# Patient Record
Sex: Female | Born: 1975 | Race: White | Hispanic: No | Marital: Married | Smoking: Current every day smoker
Health system: Southern US, Community
[De-identification: ages and names within clinical notes are randomized; demographics above are authoritative.]

## PROBLEM LIST (undated history)

## (undated) DIAGNOSIS — M792 Neuralgia and neuritis, unspecified: Secondary | ICD-10-CM

## (undated) DIAGNOSIS — Z8541 Personal history of malignant neoplasm of cervix uteri: Secondary | ICD-10-CM

## (undated) DIAGNOSIS — N3040 Irradiation cystitis without hematuria: Secondary | ICD-10-CM

## (undated) DIAGNOSIS — Z87820 Personal history of traumatic brain injury: Secondary | ICD-10-CM

## (undated) DIAGNOSIS — F32A Depression, unspecified: Secondary | ICD-10-CM

## (undated) DIAGNOSIS — Z87442 Personal history of urinary calculi: Secondary | ICD-10-CM

## (undated) DIAGNOSIS — K5909 Other constipation: Secondary | ICD-10-CM

## (undated) DIAGNOSIS — R350 Frequency of micturition: Secondary | ICD-10-CM

## (undated) DIAGNOSIS — Z973 Presence of spectacles and contact lenses: Secondary | ICD-10-CM

## (undated) DIAGNOSIS — R351 Nocturia: Secondary | ICD-10-CM

## (undated) DIAGNOSIS — K56609 Unspecified intestinal obstruction, unspecified as to partial versus complete obstruction: Secondary | ICD-10-CM

## (undated) DIAGNOSIS — E079 Disorder of thyroid, unspecified: Secondary | ICD-10-CM

## (undated) DIAGNOSIS — IMO0002 Reserved for concepts with insufficient information to code with codable children: Secondary | ICD-10-CM

## (undated) DIAGNOSIS — F329 Major depressive disorder, single episode, unspecified: Secondary | ICD-10-CM

## (undated) DIAGNOSIS — K219 Gastro-esophageal reflux disease without esophagitis: Secondary | ICD-10-CM

## (undated) DIAGNOSIS — C801 Malignant (primary) neoplasm, unspecified: Secondary | ICD-10-CM

## (undated) HISTORY — PX: OTHER SURGICAL HISTORY: SHX169

## (undated) HISTORY — PX: EXTRACORPOREAL SHOCK WAVE LITHOTRIPSY: SHX1557

## (undated) HISTORY — PX: TONSILLECTOMY: SUR1361

## (undated) HISTORY — PX: CERVICAL CONE BIOPSY: SUR198

---

## 2008-04-13 DIAGNOSIS — Z923 Personal history of irradiation: Secondary | ICD-10-CM | POA: Insufficient documentation

## 2012-11-15 DIAGNOSIS — C539 Malignant neoplasm of cervix uteri, unspecified: Secondary | ICD-10-CM | POA: Insufficient documentation

## 2013-01-31 ENCOUNTER — Encounter (HOSPITAL_BASED_OUTPATIENT_CLINIC_OR_DEPARTMENT_OTHER): Payer: Self-pay | Admitting: *Deleted

## 2013-01-31 ENCOUNTER — Other Ambulatory Visit: Payer: Self-pay | Admitting: Urology

## 2013-01-31 NOTE — Progress Notes (Signed)
NPO AFTER MN. ARRIVE AT 0900. NEEDS HG. IF NEEDED MAY TAKE NORCO W/ SIPS OF WATER.

## 2013-02-03 ENCOUNTER — Ambulatory Visit (HOSPITAL_BASED_OUTPATIENT_CLINIC_OR_DEPARTMENT_OTHER)
Admission: RE | Admit: 2013-02-03 | Discharge: 2013-02-03 | Disposition: A | Discharge status: Home / Self Care | Payer: BC Managed Care – PPO | Source: Ambulatory Visit | Attending: Urology | Admitting: Urology

## 2013-02-03 ENCOUNTER — Ambulatory Visit (HOSPITAL_BASED_OUTPATIENT_CLINIC_OR_DEPARTMENT_OTHER): Payer: BC Managed Care – PPO | Admitting: Anesthesiology

## 2013-02-03 ENCOUNTER — Encounter (HOSPITAL_BASED_OUTPATIENT_CLINIC_OR_DEPARTMENT_OTHER)
Admission: RE | Disposition: A | Discharge status: Home / Self Care | Payer: Self-pay | Source: Ambulatory Visit | Attending: Urology

## 2013-02-03 ENCOUNTER — Encounter (HOSPITAL_BASED_OUTPATIENT_CLINIC_OR_DEPARTMENT_OTHER): Payer: Self-pay | Admitting: *Deleted

## 2013-02-03 ENCOUNTER — Encounter (HOSPITAL_BASED_OUTPATIENT_CLINIC_OR_DEPARTMENT_OTHER): Payer: BC Managed Care – PPO | Admitting: Anesthesiology

## 2013-02-03 DIAGNOSIS — T66XXXS Radiation sickness, unspecified, sequela: Secondary | ICD-10-CM | POA: Insufficient documentation

## 2013-02-03 DIAGNOSIS — N3090 Cystitis, unspecified without hematuria: Secondary | ICD-10-CM | POA: Insufficient documentation

## 2013-02-03 DIAGNOSIS — M79609 Pain in unspecified limb: Secondary | ICD-10-CM | POA: Insufficient documentation

## 2013-02-03 DIAGNOSIS — N3040 Irradiation cystitis without hematuria: Secondary | ICD-10-CM | POA: Insufficient documentation

## 2013-02-03 DIAGNOSIS — R310 Gross hematuria: Secondary | ICD-10-CM | POA: Insufficient documentation

## 2013-02-03 DIAGNOSIS — Y842 Radiological procedure and radiotherapy as the cause of abnormal reaction of the patient, or of later complication, without mention of misadventure at the time of the procedure: Secondary | ICD-10-CM | POA: Insufficient documentation

## 2013-02-03 DIAGNOSIS — K219 Gastro-esophageal reflux disease without esophagitis: Secondary | ICD-10-CM | POA: Insufficient documentation

## 2013-02-03 DIAGNOSIS — Z79899 Other long term (current) drug therapy: Secondary | ICD-10-CM | POA: Insufficient documentation

## 2013-02-03 DIAGNOSIS — R351 Nocturia: Secondary | ICD-10-CM | POA: Insufficient documentation

## 2013-02-03 DIAGNOSIS — Z87891 Personal history of nicotine dependence: Secondary | ICD-10-CM | POA: Insufficient documentation

## 2013-02-03 DIAGNOSIS — C539 Malignant neoplasm of cervix uteri, unspecified: Secondary | ICD-10-CM | POA: Insufficient documentation

## 2013-02-03 HISTORY — DX: Personal history of urinary calculi: Z87.442

## 2013-02-03 HISTORY — DX: Presence of spectacles and contact lenses: Z97.3

## 2013-02-03 HISTORY — DX: Reserved for concepts with insufficient information to code with codable children: IMO0002

## 2013-02-03 HISTORY — DX: Personal history of malignant neoplasm of cervix uteri: Z85.41

## 2013-02-03 HISTORY — DX: Other constipation: K59.09

## 2013-02-03 HISTORY — DX: Gastro-esophageal reflux disease without esophagitis: K21.9

## 2013-02-03 HISTORY — DX: Nocturia: R35.1

## 2013-02-03 HISTORY — DX: Frequency of micturition: R35.0

## 2013-02-03 HISTORY — DX: Personal history of traumatic brain injury: Z87.820

## 2013-02-03 HISTORY — PX: CYSTOSCOPY WITH BIOPSY: SHX5122

## 2013-02-03 LAB — POCT HEMOGLOBIN-HEMACUE: HEMOGLOBIN: 12.7 g/dL (ref 12.0–15.0)

## 2013-02-03 SURGERY — CYSTOSCOPY, WITH BIOPSY
Anesthesia: General | Site: Bladder

## 2013-02-03 MED ORDER — LIDOCAINE HCL (CARDIAC) 20 MG/ML IV SOLN
INTRAVENOUS | Status: DC | PRN
  Administered 2013-02-03: 50 mg via INTRAVENOUS

## 2013-02-03 MED ORDER — LACTATED RINGERS IV SOLN
INTRAVENOUS | Status: DC
  Administered 2013-02-03 (×2): via INTRAVENOUS
  Filled 2013-02-03: qty 1000

## 2013-02-03 MED ORDER — DEXAMETHASONE SODIUM PHOSPHATE 4 MG/ML IJ SOLN
INTRAMUSCULAR | Status: DC | PRN
  Administered 2013-02-03: 10 mg via INTRAVENOUS

## 2013-02-03 MED ORDER — TRIMETHOPRIM 100 MG PO TABS: 100.0000 mg | ORAL_TABLET | ORAL | Status: DC

## 2013-02-03 MED ORDER — MIDAZOLAM HCL 5 MG/5ML IJ SOLN
INTRAMUSCULAR | Status: DC | PRN
Start: 2013-02-03 — End: 2013-02-03
  Administered 2013-02-03: 2 mg via INTRAVENOUS

## 2013-02-03 MED ORDER — ACETAMINOPHEN 10 MG/ML IV SOLN
INTRAVENOUS | Status: DC | PRN
  Administered 2013-02-03: 1000 mg via INTRAVENOUS

## 2013-02-03 MED ORDER — BELLADONNA ALKALOIDS-OPIUM 16.2-60 MG RE SUPP
RECTAL | Status: DC | PRN
  Administered 2013-02-03: 1 via RECTAL

## 2013-02-03 MED ORDER — CIPROFLOXACIN IN D5W 400 MG/200ML IV SOLN
400.0000 mg | INTRAVENOUS | Status: AC
Start: 2013-02-03 — End: 2013-02-03
  Administered 2013-02-03: 400 mg via INTRAVENOUS
  Filled 2013-02-03 (×2): qty 200

## 2013-02-03 MED ORDER — PHENAZOPYRIDINE HCL 200 MG PO TABS
ORAL | Status: DC | PRN
  Administered 2013-02-03: 11:00:00 via INTRAVESICAL

## 2013-02-03 MED ORDER — PROPOFOL 10 MG/ML IV BOLUS
INTRAVENOUS | Status: DC | PRN
  Administered 2013-02-03: 150 mg via INTRAVENOUS
  Administered 2013-02-03: 30 mg via INTRAVENOUS

## 2013-02-03 MED ORDER — FENTANYL CITRATE 0.05 MG/ML IJ SOLN
INTRAMUSCULAR | Status: DC | PRN
Start: 2013-02-03 — End: 2013-02-03
  Administered 2013-02-03: 75 ug via INTRAVENOUS
  Administered 2013-02-03: 25 ug via INTRAVENOUS

## 2013-02-03 MED ORDER — BELLADONNA ALKALOIDS-OPIUM 16.2-60 MG RE SUPP
RECTAL | Status: AC
  Filled 2013-02-03: qty 1

## 2013-02-03 MED ORDER — STERILE WATER FOR IRRIGATION IR SOLN
Status: DC | PRN
  Administered 2013-02-03: 3000 mL

## 2013-02-03 MED ORDER — UROGESIC-BLUE 81.6 MG PO TABS: ORAL_TABLET | ORAL | Status: DC

## 2013-02-03 MED ORDER — OXYCODONE-ACETAMINOPHEN 5-325 MG PO TABS: 1.0000 | ORAL_TABLET | ORAL | Status: DC | PRN

## 2013-02-03 MED ORDER — MIDAZOLAM HCL 2 MG/2ML IJ SOLN
INTRAMUSCULAR | Status: AC
  Filled 2013-02-03: qty 2

## 2013-02-03 MED ORDER — FENTANYL CITRATE 0.05 MG/ML IJ SOLN
25.0000 ug | INTRAMUSCULAR | Status: DC | PRN
  Filled 2013-02-03: qty 1

## 2013-02-03 MED ORDER — LACTATED RINGERS IV SOLN
INTRAVENOUS | Status: DC
  Filled 2013-02-03: qty 1000

## 2013-02-03 MED ORDER — OXYCODONE-ACETAMINOPHEN 5-325 MG PO TABS
1.0000 | ORAL_TABLET | ORAL | Status: DC | PRN
Start: 2013-02-03 — End: 2013-02-03
  Administered 2013-02-03: 1 via ORAL
  Filled 2013-02-03 (×2): qty 1

## 2013-02-03 MED ORDER — OXYCODONE-ACETAMINOPHEN 5-325 MG PO TABS
ORAL_TABLET | ORAL | Status: AC
  Filled 2013-02-03: qty 1

## 2013-02-03 MED ORDER — FENTANYL CITRATE 0.05 MG/ML IJ SOLN
INTRAMUSCULAR | Status: AC
  Filled 2013-02-03: qty 2

## 2013-02-03 MED ORDER — KETOROLAC TROMETHAMINE 30 MG/ML IJ SOLN
INTRAMUSCULAR | Status: DC | PRN
  Administered 2013-02-03: 30 mg via INTRAVENOUS

## 2013-02-03 MED ORDER — ONDANSETRON HCL 4 MG/2ML IJ SOLN
INTRAMUSCULAR | Status: DC | PRN
  Administered 2013-02-03: 4 mg via INTRAVENOUS

## 2013-02-03 SURGICAL SUPPLY — 30 items
BAG DRAIN URO-CYSTO SKYTR STRL (DRAIN) ×3 IMPLANT
BOOTIES KNEE HIGH SLOAN (MISCELLANEOUS) IMPLANT
CANISTER SUCT LVC 12 LTR MEDI- (MISCELLANEOUS) ×3 IMPLANT
CATH ROBINSON RED A/P 14FR (CATHETERS) ×3 IMPLANT
CLOTH BEACON ORANGE TIMEOUT ST (SAFETY) ×3 IMPLANT
DRAPE CAMERA CLOSED 9X96 (DRAPES) ×3 IMPLANT
ELECT REM PT RETURN 9FT ADLT (ELECTROSURGICAL) ×3
ELECTRODE REM PT RTRN 9FT ADLT (ELECTROSURGICAL) ×1 IMPLANT
GLOVE BIO SURGEON STRL SZ7.5 (GLOVE) ×3 IMPLANT
GLOVE BIOGEL M SZ8.5 STRL (GLOVE) ×3 IMPLANT
GLOVE BIOGEL PI IND STRL 7.5 (GLOVE) ×1 IMPLANT
GLOVE BIOGEL PI IND STRL 8 (GLOVE) ×1 IMPLANT
GLOVE BIOGEL PI IND STRL 8.5 (GLOVE) ×1 IMPLANT
GLOVE BIOGEL PI INDICATOR 7.5 (GLOVE) ×2
GLOVE BIOGEL PI INDICATOR 8 (GLOVE) ×2
GLOVE BIOGEL PI INDICATOR 8.5 (GLOVE) ×2
GOWN PREVENTION PLUS LG XLONG (DISPOSABLE) ×3 IMPLANT
GOWN STRL REIN XL XLG (GOWN DISPOSABLE) ×6 IMPLANT
GOWN STRL REUS W/ TWL LRG LVL3 (GOWN DISPOSABLE) ×1 IMPLANT
GOWN STRL REUS W/ TWL XL LVL3 (GOWN DISPOSABLE) ×2 IMPLANT
GOWN STRL REUS W/TWL LRG LVL3 (GOWN DISPOSABLE) ×2
GOWN STRL REUS W/TWL XL LVL3 (GOWN DISPOSABLE) ×4
NDL SAFETY ECLIPSE 18X1.5 (NEEDLE) ×1 IMPLANT
NEEDLE HYPO 18GX1.5 SHARP (NEEDLE) ×2
NEEDLE HYPO 22GX1.5 SAFETY (NEEDLE) ×3 IMPLANT
NEEDLE SPNL 22GX7 QUINCKE BK (NEEDLE) IMPLANT
NS IRRIG 500ML POUR BTL (IV SOLUTION) IMPLANT
PACK CYSTOSCOPY (CUSTOM PROCEDURE TRAY) ×3 IMPLANT
SYR 20CC LL (SYRINGE) ×3 IMPLANT
WATER STERILE IRR 3000ML UROMA (IV SOLUTION) ×3 IMPLANT

## 2013-02-03 NOTE — Anesthesia Procedure Notes (Signed)
Procedure Name: LMA Insertion Date/Time: 02/03/2013 10:53 AM Performed by: Bethena Roys T Pre-anesthesia Checklist: Patient identified, Emergency Drugs available, Suction available and Patient being monitored Patient Re-evaluated:Patient Re-evaluated prior to inductionOxygen Delivery Method: Circle System Utilized Preoxygenation: Pre-oxygenation with 100% oxygen Intubation Type: IV induction Ventilation: Mask ventilation without difficulty LMA: LMA inserted LMA Size: 3.0 Number of attempts: 1 Airway Equipment and Method: bite block Placement Confirmation: positive ETCO2 Dental Injury: Teeth and Oropharynx as per pre-operative assessment

## 2013-02-03 NOTE — Anesthesia Postprocedure Evaluation (Signed)
  Anesthesia Post-op Note  Patient: Sue Benson  Procedure(s) Performed: Procedure(s) (LRB): CYSTOSCOPY WITH BIOPSY, CAUTERIZATION OF THE BIOPSY SITES AND RECTAL EXAM UNDER ANESTHESIA (N/A)  Patient Location: PACU  Anesthesia Type: General  Level of Consciousness: awake and alert   Airway and Oxygen Therapy: Patient Spontanous Breathing  Post-op Pain: mild  Post-op Assessment: Post-op Vital signs reviewed, Patient's Cardiovascular Status Stable, Respiratory Function Stable, Patent Airway and No signs of Nausea or vomiting  Last Vitals:  Filed Vitals:   02/03/13 0906  BP: 88/65  Pulse: 71  Temp: 36.2 C  Resp: 16    Post-op Vital Signs: stable   Complications: No apparent anesthesia complications

## 2013-02-03 NOTE — Anesthesia Preprocedure Evaluation (Addendum)
Anesthesia Evaluation  Patient identified by MRN, date of birth, ID band Patient awake    Reviewed: Allergy & Precautions, H&P , NPO status , Patient's Chart, lab work & pertinent test results  Airway Mallampati: II TM Distance: >3 FB Neck ROM: full    Dental no notable dental hx. (+) Teeth Intact and Dental Advisory Given   Pulmonary neg pulmonary ROS, former smoker,  breath sounds clear to auscultation  Pulmonary exam normal       Cardiovascular Exercise Tolerance: Good negative cardio ROS  Rhythm:regular Rate:Normal     Neuro/Psych negative neurological ROS  negative psych ROS   GI/Hepatic negative GI ROS, Neg liver ROS, GERD-  Medicated and Controlled,  Endo/Other  negative endocrine ROS  Renal/GU negative Renal ROS  negative genitourinary   Musculoskeletal   Abdominal   Peds  Hematology negative hematology ROS (+)   Anesthesia Other Findings   Reproductive/Obstetrics negative OB ROS Cervical cancer                          Anesthesia Physical Anesthesia Plan  ASA: II  Anesthesia Plan: General   Post-op Pain Management:    Induction: Intravenous  Airway Management Planned: LMA  Additional Equipment:   Intra-op Plan:   Post-operative Plan:   Informed Consent: I have reviewed the patients History and Physical, chart, labs and discussed the procedure including the risks, benefits and alternatives for the proposed anesthesia with the patient or authorized representative who has indicated his/her understanding and acceptance.   Dental Advisory Given  Plan Discussed with: CRNA and Surgeon  Anesthesia Plan Comments:         Anesthesia Quick Evaluation

## 2013-02-03 NOTE — Transfer of Care (Signed)
Immediate Anesthesia Transfer of Care Note  Patient: Sue Benson  Procedure(s) Performed: Procedure(s): CYSTOSCOPY WITH BIOPSY, CAUTERIZATION OF THE BIOPSY SITES AND RECTAL EXAM UNDER ANESTHESIA (N/A)  Patient Location: PACU  Anesthesia Type:General  Level of Consciousness: awake, alert  and oriented  Airway & Oxygen Therapy: Patient Spontanous Breathing and Patient connected to nasal cannula oxygen  Post-op Assessment: Report given to PACU RN  Post vital signs: Reviewed and stable  Complications: No apparent anesthesia complications

## 2013-02-03 NOTE — Op Note (Signed)
Pre-operative diagnosis :  Cancer of the cervix, prechemotherapy and radiation therapy, gross hematuria.  Postoperative diagnosis:  Delayed onset recent cystitis  Operation:  Cystourethroscopy, cold cup bladder biopsy, cauterization of biopsy sites, rectal examination  Surgeon:  S. Gaynelle Arabian, MD  First assistant:  None  Anesthesia:  General LMA  Preparation:   After appropriate free anesthesia, the patient was brought to room, placed preoperative bulk a dorsal supine position where general elevated anesthesia was induced. She was then replaced in the dorsal lithotomy position where the pubis was prepped with Betadine solution and draped in the usual fashion. The orbit we'll double check. History was double checked.  Review history: n Radiation cystitis  Active Problems  Problems  1. Cervical cancer (180.9)  2. Increased urinary frequency (788.41)  3. 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 Her tumor was thought to be too progressed to be removed, so she underwent brachytherapy and chemotherapy in [place] 5 years ago. She has done well, until she developed gross painless hematuria in October, 2014, associated with nocturia and urinary frequency. 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  She c/o anterior thigh pain, lateral leg pain, without pain radiating below the knees. She has some frequency and urgency and nocturia, but this is not significant enough to warrant medication. She is on chronic pain management with codeine.      Statement of  Likelihood of Success: Excellent. TIME-OUT observed.:  Procedure:  The cystoscope  was passed into the bladder without trauma, and  There was no immediate evidence of bladder mass. The trigone was normal, with clear eflux from both orifices. Multiple areas of telangiectasias were identified and biopsied on the surface of the mucosa of the bladder walls , and the biopsy sites cauterized. The biopsies were sent to laboratory for examination. This appeared to be typical radiation change.  Pyridium was inserted into the bladder to afford post operative analgesia. Rectal exam showed normal rectal vault. No evidence of fistula formation was noted. No masses were identified.

## 2013-02-03 NOTE — Interval H&P Note (Signed)
History and Physical Interval Note:  02/03/2013 11:23 AM  Sue Benson  has presented today for surgery, with the diagnosis of Cervical Cancer, Increased Urinary Frequency, Nocturia, Dyspareunia  The various methods of treatment have been discussed with the patient and family. After consideration of risks, benefits and other options for treatment, the patient has consented to  Procedure(s): CYSTOSCOPY WITH BIOPSY, CAUTERIZATION OF THE BIOPSY SITES AND RECTAL EXAM UNDER ANESTHESIA (N/A) as a surgical intervention .  The patient's history has been reviewed, patient examined, no change in status, stable for surgery.  I have reviewed the patient's chart and labs.  Questions were answered to the patient's satisfaction.     Carolan Clines I

## 2013-02-03 NOTE — H&P (Signed)
Reason For Visit Radiation cystitis   Active Problems Problems  1. Cervical cancer (180.9) 2. Increased urinary frequency (788.41) 3. 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 Her tumor was thought to be too progressed to be removed, so she underwent brachytherapy and chemotherapy in [place] 5 years ago. She has done well, until she developed gross painless hematuria in October, 2014, associated with nocturia and urinary frequency. 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    She c/o anterior thigh pain, lateral leg pain, without pain radiating below the knees. She has some frequency and urgency and nocturia, but this is not significant enough to warrant medication. She is on chronic pain management with codeine.   Past Medical History Problems  1. History of Anxiety (300.00) 2. History of cervical cancer (V10.41) 3. History of depression (V11.8) 4. History of gastroesophageal reflux (GERD) (V12.79) 5. History of renal calculi (V13.01)  Surgical History Problems  1. History of Cystoscopy With Insertion Of Ureteral Stent Bilateral 2. History of Lithotripsy 3. History of Radiation Therapy 4. History of Tonsillectomy  Current Meds 1. Adderall 10 MG Oral Tablet;  Therapy: (Recorded:02Jan2015) to Recorded 2. ClonazePAM 1 MG Oral Tablet;  Therapy: (Recorded:02Jan2015) to Recorded 3. Cymbalta CPEP;  Therapy: (JOINOMVE:[id number]) to Recorded 4. Deplin 15 MG TABS;  Therapy: (GEZMOQHU:[id number]) to Recorded 5. Norco 7.5-325 MG Oral Tablet;  Therapy: (Recorded:02Jan2015) to Recorded 6. Prometrium 100 MG Oral Capsule;  Therapy: (Recorded:02Jan2015) to Recorded 7. Vivelle-Dot 0.025 MG/24HR Transdermal Patch Twice Weekly;  Therapy: (Recorded:02Jan2015) to Recorded  Allergies Medication  1. Penicillins 2. Ceclor CAPS  Family History Problems  1. Family history of diabetes mellitus (V18.0) : Mother 2. Family history of fibromyalgia (V17.89) : Mother 3. Family history of Myasthenic crisis : Father  Social History Problems    Alcohol use   Caffeine use (V49.89)   Current every day smoker (305.1)   Electronic cigarette use (305.1)   Father alive   Former smoker (V15.82)   Married   Mother alive   Denied: History of Number of children  Review of Systems Genitourinary, constitutional, skin, eye, otolaryngeal, hematologic/lymphatic, cardiovascular, pulmonary, endocrine, musculoskeletal, gastrointestinal, neurological and psychiatric system(s) were reviewed and pertinent findings if present are noted.  Genitourinary: urinary frequency, urinary urgency, nocturia, incontinence, hematuria and dyspareunia.  Gastrointestinal: nausea, diarrhea and constipation, but no vomiting and no heartburn.  Constitutional: feeling tired (fatigue) and recent weight loss, but no fever and no night sweats.  Integumentary: pruritus, but no new skin rashes or lesions.  Eyes: no blurred vision and no diplopia.  ENT: sinus problems, but no sore throat.  Hematologic/Lymphatic: a tendency to easily bruise, but no swollen glands.  Cardiovascular: no chest pain and no leg swelling.  Respiratory: no shortness of breath and no cough.  Endocrine: no polydipsia.  Musculoskeletal: back pain, but no joint pain.  Neurological: dizziness and headache.  Psychiatric: depression and anxiety.    Vitals Vital Signs [Data Includes: Last 1 Day]  Recorded: [id number] 01:34PM  Height: 5 ft 3 in Weight: 117 lb  BMI Calculated: 20.73 BSA Calculated: 1.54 Blood Pressure: 111 / 75 Heart Rate: 116  Physical Exam Constitutional:  Well nourished .  No acute distress. The patient appears well hydrated.  ENT:. The ears and nose are normal in appearance.  Neck: The appearance of the neck is normal and no neck mass is present.  Pulmonary: No respiratory distress and normal respiratory rhythm and effort.  Cardiovascular: Heart rate and rhythm are normal . No peripheral edema.  Abdomen: The abdomen is flat. The abdomen is soft and nontender. No masses are palpated. No hernias are palpable.  Genitourinary:. Painful Right levator> Left levator. No rectal pain. No bladder base pain. 3+ Right para-cervical pain and scarring.  Chaperone Present: kim lewis.  Examination of the external genitalia shows normal female external genitalia, no vulvar atrophy, no vulvar mass and no labial adhesions. The urethra is normal in appearance, not tender and does not appear stenotic. There is no urethral mass. Urethral hypermobility is not present. There is no urethral discharge. No periurethral cyst is identified. There is no urethral prolapse. Vaginal exam demonstrates tenderness and the vaginal epithelium to be poorly estrogenized, but no atrophy and no uterine prolapse. No cystocele is identified. No enterocele is identified. No rectocele is identified. There is no evidence of a vesicovaginal fistula. The uterus is tender, but not enlarged. The bladder is normal on palpation, non tender, not distended and without masses. The anus is normal on inspection. The perineum is normal on inspection.  Lymphatics: The femoral and inguinal nodes are not enlarged or tender.  Skin: Normal skin turgor, no visible rash and no visible skin lesions.  Neuro/Psych:. Mood and affect are appropriate.    Results/Data Urine [Data Includes: Last 1 Day]   [id number]  COLOR STRAW   APPEARANCE CLEAR   SPECIFIC GRAVITY 1.010   pH 5.5   GLUCOSE NEG mg/dL  BILIRUBIN NEG   KETONE NEG mg/dL  BLOOD NEG   PROTEIN NEG mg/dL  UROBILINOGEN 0.2 mg/dL  NITRITE NEG   LEUKOCYTE  ESTERASE NEG    Assessment Assessed  1. Cervical cancer (180.9) 2. Increased urinary frequency (788.41) 3. Nocturia (788.43) 4. Dyspareunia, female (625.0)  > 60 minutes in evaluation with patient. CT from [facility] reviewed.   38 yo female with : 1. gross hematuria, with hx of ca Cervix, post Rad Rx and chemotherapy. CT normal. No stones. Normal kidneys. She needs cystoscopy, and possible bladder biopsy. Concern is for possible tumor re-growth in bladder wall, vs radiation effect in bladder.    Concern is also for biopsy-induced fistula-and we have discussed this possibility. Concern would be then if there were cancer in the fistula, vs radiation necrosis. We have briefly discussed types of repair of fistula also.   2. Dyspareunia: Palpable tenderness and scar in vagina-on the right side of the fornix. She has right sided levator pain also.  3. Urinary frequency and nocturia-mild now. She is concerned with taking anticholinergics, because of chronic constipation induced by her chronic narcotic use for pain control. She is still trying to control her pain, and her constipation, and I agree with her that she is not a good candidate for medical treatment yet, specifically with anticholinergics -rather, she would be better treated with Myrbetriq as a primary medicine, Celexa for chronic pain control, gabapentin, and possibly PTNS; or possibly Botox in the future ( in bladder and in levator). She could also be a candidate for hyperbaric )2 therapy, which we have discussed.    In the end, however, she may be a better candidate for dorsal column nerve stimulator for chronic pain control. She would need to have video urodynamcis  for evaluation prior to any urologic intervention following diagnostic cystoscopy.   Plan Cervical cancer, Increased urinary frequency, Nocturia  1. Pelvic Exam; Status:Complete;   Done: [id number] Health Maintenance  2. UA With REFLEX; [Do Not Release]; Status:Resulted -  Requires Verification;   Done:  [id number] 01:20PM  Cysto to establish bladder volume, and bladder biopsy, to R/o tumor recurrence, in view of gross hematuria and CT abnormality.   RTC to discuss.   She will discuss chronic pain management/ with her Oncologists.   She is not ready for treatment of radiation cystitis yet.   Discussion/Summary cc: Royal Hawthorn, MD [facility] Department radiation Oncology  cc: Dr. Marin Comment, [facility] Department GYN-Oncology     Signatures Electronically signed by : Carolan Clines, M.D.; Jan 27 2013  6:01PM EST

## 2013-02-03 NOTE — Discharge Instructions (Addendum)
Radiation and its Effects on Humans Radiation is a form of energy. The radiation of concern is ionizing radiation. Atoms release radiation as they change from unstable, energized forms to more stable forms. All matter is composed of elements, and elements that are radioactive are generally referred to as radionuclides. Each element can take many different forms (called isotopes). Some of these isotopes are unstable and emit radiation; these unstable isotopes are known as radioisotopes or radionuclides. Stable isotopes do not undergo radioactive decay and therefore do not emit radiation. TYPES OF RADIATION  Alpha particles can travel only a few inches in the air and lose their energy almost as soon as they collide with anything. They are easily shielded by a sheet of paper or the outer layer of a person's skin. Alpha particles are hazardous only when they are inhaled or swallowed.  Beta particles can travel in the air for a distance of a few feet. Beta particles can pass through a sheet of paper but can be stopped by a sheet of aluminum foil or glass. Beta particles can damage skin, but are most hazardous when swallowed or inhaled.  Gamma rays are waves of pure energy and are similar to X-rays. They travel at the speed of light through air or open spaces. Concrete, lead, or steel must be used to block gamma rays. Gamma rays can present an extreme external hazard.  Neutrons are small particles that have no electrical charge. They can travel long distances in air and are released during nuclear fission. Water or concrete offer the best shielding against neutrons. Like gamma rays, neutrons can present an extreme external hazard. TERMS USED FOR RADIATION MEASUREMENTS  Radiation is measured in different ways. Measurements used in the [place] include the following (the internationally used equivalent unit of measurement follows in parenthesis):  Rad (radiation absorbed dose) measures the amount of  energy actually absorbed by a material, such as human tissue (Gray=100 rads).  Roentgen is a measure of exposure; it describes the amount of radiation energy, in the form of gamma or X-rays, in the air.  Rem (Roentgen equivalent man) measures the biological damage of radiation. It takes into account both the amount, or dose, of radiation and the biological effect of the type of radiation in question. A millirem is one one-thousandth of a rem (Sievert=100 rems).  Curie is a unit of radioactivity. One curie refers to the amount of any radionuclide that undergoes 37 billion atomic transformations a second. A nanocurie is one one-billionth of a curie (37 Becquerel = 1 nanocurie). WHAT LEVELS OF RADIATION ARE PEOPLE EXPOSED TO IN EVERYDAY LIFE?  To put an emergency situation in perspective, it helps to be aware of the radiation levels people encounter in everyday life. Individual exposures vary, but humans are exposed routinely to radiation from both natural sources, such as cosmic rays from the sun and indoor radon, and from [employer] sources, such as televisions and medical X-rays. Even the human body contains natural radioactive elements. Because individual human exposures to radiation are usually small, the millirem (one one-thousandth of a rem) is generally used to express the doses humans receive. The following table shows average radiation doses from several common sources of human exposure. Radiation Source Dose (millirems) Chest X-ray.....................................10 Mammogram.................................30 Cosmic rays...................................31 (annually) Human body..................................44 (annually) Household radon.......................200 (annually) Designer, television/film set.....Marland Kitchen5 LEGAL LIMITS FOR RADIATION EXPOSURE Another way to help put a radiological emergency into perspective is to be aware of the radiation exposure limits for people who work  with  and around radioactive materials full time, as shown in the following table:  Worker Category Legal Limit 40 year old female..........................5 rem/year Pregnant woman.....................500 millirem (mrem) during pregnancy THE EFFECT OF RADIATION ON HUMANS  Radiation effects fall into two broad categories: deterministic and stochastic. At the cellular level, high doses of ionizing radiation can result in severe dysfunction, even death, of cells. At the organ level, if a sufficient number of cells are so affected, the function of the organ is impaired. Such effects are called "deterministic." Deterministic effects have definite threshold doses, which means, that the effect is not seen until the absorbed dose is greater than a certain level.  Once above that threshold level, the severity of the effect increases with dose. Also, deterministic effects are usually manifested soon after exposure. Examples of such effects include radiation skin burning, blood count effects, and cataracts.  In contrast, stochastic effects are caused by more subtle radiation-induced cellular changes (usually DNA mutations) that are random in nature and have no threshold dose. The probability of such effects increases with dose, but the severity does not. Cancer is the only observed clinical manifestation of radiation-induced stochastic effects. Not only is the severity independent of dose, but also, there is a substantial delay between the time of exposure and the appearance of the cancer, ranging from several years for leukemia to decades for solid tumors. Cancer can result from some DNA changes in the somatic cells of the body, but radiation can also damage the germ cells (ova and sperm) to produce hereditary effects. These are also classified as stochastic; however, clinical manifestations of such effects have not been observed in humans at a statistically significant level.  The nature and extent of damage caused by  ionizing radiation depend on a number of factors, including the amount of exposure, the frequency of exposure, and the penetrating power of the radiation to which an individual is exposed. Rapid exposure to very large doses of ionizing radiation is rare but can cause death within a few days or months. The sensitivity of the exposed cells also influences the extent of damage. For example, rapidly growing tissues, such as developing embryos, are particularly vulnerable to harm from ionizing radiation.  IMPORTANT EMERGENCY RESPONSE TERMS  ERAMS (Environmental Radiation Ambient Monitoring System) is the EPA-operated monitoring system used to measure radioactivity and other contaminants in the environment. There are 260 ERAMS sampling stations throughout the [place] In an emergency, the sampling stations can be used to provide information on how far contamination has spread.  Plume - The airborne "cloud" of material released to the environment. The plume may contain nuclear materials and may or may not be visible.  Protective Action - Any action taken to reduce or avoid a radiation dose to the public. Protective Action Guide (PAG) - A predetermined projected dose level at which specified actions  Post Anesthesia Home Care Instructions  Activity: Get plenty of rest for the remainder of the day. A responsible adult should stay with you for 24 hours following the procedure.  For the next 24 hours, DO NOT: -Drive a car -Paediatric nurse -Drink alcoholic beverages -Take any medication unless instructed by your physician -Make any legal decisions or sign important papers.  Meals: Start with liquid foods such as gelatin or soup. Progress to regular foods as tolerated. Avoid greasy, spicy, heavy foods. If nausea and/or vomiting occur, drink only clear liquids until the nausea and/or vomiting subsides. Call your physician if vomiting continues.  Special Instructions/Symptoms: Your throat may feel dry  or sore  from the anesthesia or the breathing tube placed in your throat during surgery. If this causes discomfort, gargle with warm salt water. The discomfort should disappear within 24 hours.  should be taken to protect the public from exposure to radiation. Document Released: 04/04/2002 Document Revised: 04/06/2011 Document Reviewed: 01/12/2005 [facility] Patient Information 2014 [place], [place].  CYSTOSCOPY HOME CARE INSTRUCTIONS  Activity: Rest for the remainder of the day.  Do not drive or operate equipment today.  You may resume normal activities in one to two days as instructed by your physician.   Meals: Drink plenty of liquids and eat light foods such as gelatin or soup this evening.  You may return to a normal meal plan tomorrow.  Return to Work: You may return to work in one to two days or as instructed by your physician.  Special Instructions / Symptoms: Call your physician if any of these symptoms occur:   -persistent or heavy bleeding  -bleeding which continues after first few urination  -large blood clots that are difficult to pass  -urine stream diminishes or stops completely  -fever equal to or higher than 101 degrees Farenheit.  -cloudy urine with a strong, foul odor  -severe pain  Females should always wipe from front to back after elimination.  You may feel some burning pain when you urinate.  This should disappear with time.  Applying moist heat to the lower abdomen or a hot tub bath may help relieve the pain. \  Follow-Up / Date of Return Visit to Your Physician:  *** Call for an appointment to arrange follow-up.  Patient Signature:  ________________________________________________________  Nurse's Signature:  ________________________________________________________  Post Anesthesia Home Care Instructions  Activity: Get plenty of rest for the remainder of the day. A responsible adult should stay with you for 24 hours following the procedure.  For the next 24  hours, DO NOT: -Drive a car -Paediatric nurse -Drink alcoholic beverages -Take any medication unless instructed by your physician -Make any legal decisions or sign important papers.  Meals: Start with liquid foods such as gelatin or soup. Progress to regular foods as tolerated. Avoid greasy, spicy, heavy foods. If nausea and/or vomiting occur, drink only clear liquids until the nausea and/or vomiting subsides. Call your physician if vomiting continues.  Special Instructions/Symptoms: Your throat may feel dry or sore from the anesthesia or the breathing tube placed in your throat during surgery. If this causes discomfort, gargle with warm salt water. The discomfort should disappear within 24 hours.

## 2013-02-06 ENCOUNTER — Encounter (HOSPITAL_BASED_OUTPATIENT_CLINIC_OR_DEPARTMENT_OTHER): Payer: Self-pay | Admitting: Urology

## 2013-03-24 ENCOUNTER — Encounter (INDEPENDENT_AMBULATORY_CARE_PROVIDER_SITE_OTHER): Payer: Self-pay

## 2013-03-24 ENCOUNTER — Ambulatory Visit: Payer: BC Managed Care – PPO | Attending: Gynecology | Admitting: Gynecology

## 2013-03-24 ENCOUNTER — Encounter: Payer: Self-pay | Admitting: Gynecology

## 2013-03-24 VITALS — BP 117/64 | HR 91 | Temp 98.8°F | Resp 18 | Ht 63.0 in | Wt 110.7 lb

## 2013-03-24 DIAGNOSIS — Z880 Allergy status to penicillin: Secondary | ICD-10-CM | POA: Insufficient documentation

## 2013-03-24 DIAGNOSIS — T66XXXS Radiation sickness, unspecified, sequela: Secondary | ICD-10-CM | POA: Insufficient documentation

## 2013-03-24 DIAGNOSIS — Z9221 Personal history of antineoplastic chemotherapy: Secondary | ICD-10-CM | POA: Insufficient documentation

## 2013-03-24 DIAGNOSIS — Y842 Radiological procedure and radiotherapy as the cause of abnormal reaction of the patient, or of later complication, without mention of misadventure at the time of the procedure: Secondary | ICD-10-CM | POA: Insufficient documentation

## 2013-03-24 DIAGNOSIS — G8929 Other chronic pain: Secondary | ICD-10-CM | POA: Insufficient documentation

## 2013-03-24 DIAGNOSIS — C539 Malignant neoplasm of cervix uteri, unspecified: Secondary | ICD-10-CM | POA: Insufficient documentation

## 2013-03-24 DIAGNOSIS — Z8541 Personal history of malignant neoplasm of cervix uteri: Secondary | ICD-10-CM | POA: Insufficient documentation

## 2013-03-24 DIAGNOSIS — Z923 Personal history of irradiation: Secondary | ICD-10-CM | POA: Insufficient documentation

## 2013-03-24 DIAGNOSIS — Z79899 Other long term (current) drug therapy: Secondary | ICD-10-CM | POA: Insufficient documentation

## 2013-03-24 DIAGNOSIS — Z881 Allergy status to other antibiotic agents status: Secondary | ICD-10-CM | POA: Insufficient documentation

## 2013-03-24 DIAGNOSIS — N3040 Irradiation cystitis without hematuria: Secondary | ICD-10-CM | POA: Insufficient documentation

## 2013-03-24 DIAGNOSIS — M25559 Pain in unspecified hip: Secondary | ICD-10-CM | POA: Insufficient documentation

## 2013-03-24 DIAGNOSIS — N949 Unspecified condition associated with female genital organs and menstrual cycle: Secondary | ICD-10-CM | POA: Insufficient documentation

## 2013-03-24 NOTE — Patient Instructions (Signed)
We will contact you with your upcoming appointment with Dr. Peterson Ao at [facility].  Please call for any questions or concerns.

## 2013-03-24 NOTE — Progress Notes (Signed)
Consult Note: Gyn-Onc   Russell Engelstad 38 y.o. female  No chief complaint on file.   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  Chronic radiation cystitis.  Plan: The patient is given a new procedure for Percocet which has been using to control pain. In addition, might refer her to the [facility] pelvic pain clinic to see Dr. Peterson Ao.      HPI: 38 year old white married female seen in consultation request of Dr. Katrine Coho regarding chronic pelvic pain.  Patient was treated at [facility] for a stage IIB cervical cancer approximately 5 years ago. She received whole pelvis radiation therapy along with brachytherapy and weekly cisplatin. She's been followed by Drs. Pamella Pert Le and Laurann Montana. She apparently had symptoms of radiation cystitis and saw Dr. Gaynelle Arabian who performed cystoscopy and bladder biopsy. Cystoscopy revealed radiation cystitis and biopsy was essentially negative.  Patient reports that as the day progresses she progressively has pain in the pelvis which is about equal bilaterally. This seems to radiate down her anterior thighs. She denies any lymphedema in the legs. Because of pain she is not sexually active and has not been riding a horse that she did in the past. She denies any vaginal bleeding.  Patient also reports what sounds like intermittent small bowel obstruction although she has not had an episode of nausea vomiting and cramping for approximately a year.  Review of Systems:10 point review of systems is negative except as noted in interval history.   Vitals: Blood pressure 117/64, pulse 91, temperature 98.8 F (37.1 C), temperature source Oral, resp. rate 18, height 5\' 3"  (1.6 m), weight 110 lb 11.2 oz (50.213 kg).  Physical Exam: General : The patient is a healthy woman in no acute distress.  HEENT: normocephalic, extraoccular movements normal; neck is supple without thyromegally  Lynphnodes: Supraclavicular and inguinal nodes not  enlarged  Abdomen: Soft, non-tender, no ascites, no organomegally, no masses, no hernias  Pelvic:  EGBUS: Normal female  Vagina: Normal, no lesions , there is some scarring and fibrosis at the apex the vagina. Urethra and Bladder: Normal, non-tender  Cervix: I'm unable to visualize the cervix secondary to upper vaginal agglutination  Uterus: Difficult to palpate Bi-manual examination: Non-tender; no adenxal masses or nodularity , there is some radiation fibrosis in the upper vagina although it is not extreme. Rectal: normal sphincter tone, no masses, no blood  Lower extremities: No edema or varicosities. Normal range of motion      Allergies  Allergen Reactions  . Penicillins Anaphylaxis  . Ceclor [Cefaclor] Rash    Past Medical History  Diagnosis Date  . Frequency of urination   . Nocturia   . Dyspareunia   . History of cervical cancer ONCOLOGIST--  [place]    DX 2010--  S/P RADIATION (EXTERNAL AND BRACHY) AND CHEMO SPRING 2010--  NO RECURRENCE  . Chronic constipation     SECONDARY TO RADIATION  . History of concussion     NO RESIDUAL  . Mild acid reflux   . History of kidney stones   . Wears glasses     Past Surgical History  Procedure Laterality Date  . Cervical cone biopsy  FALL 2009  . Extracorporeal shock wave lithotripsy  SUMMER 2013  . Cysto/ bilateral ureteroscopic stone extractions and stent placement  FALL 2013  . Tonsillectomy   FALL 2010-- LEFT SIDE    2007--- RIGHT SIDE  AND REMOVAL BENIGN CYST  . Cystoscopy with biopsy N/A 02/03/2013  Procedure: CYSTOSCOPY WITH BIOPSY, CAUTERIZATION OF THE BIOPSY SITES INSTILLATION OF PYRIDIUM  AND RECTAL EXAM UNDER ANESTHESIA;  Surgeon: Ailene Rud, MD;  Location: [facility];  Service: Urology;  Laterality: N/A;    Current Outpatient Prescriptions  Medication Sig Dispense Refill  . Amphetamine-Dextroamphetamine (ADDERALL PO) Take by mouth as needed.      . clonazePAM (KLONOPIN) 1 MG  tablet Take 1 mg by mouth at bedtime.      . DULoxetine (CYMBALTA) 30 MG capsule Take 90 mg by mouth daily.      Marland Kitchen estradiol (VIVELLE-DOT) 0.025 MG/24HR Place 1 patch onto the skin 2 (two) times a week.      Marland Kitchen L-Methylfolate (DEPLIN) 15 MG TABS Take by mouth.      . Methen-Hyosc-Meth Blue-Na Phos (UROGESIC-BLUE) 81.6 MG TABS Take 1 3 x /day as needed for ruinary burning and spasm. Will discolor urine blue/green.  90 tablet  5  . polyethylene glycol (MIRALAX / GLYCOLAX) packet Take 17 g by mouth daily.      . progesterone (PROMETRIUM) 100 MG capsule Take 100 mg by mouth daily.       No current facility-administered medications for this visit.    History   Social History  . Marital Status: Married    Spouse Name: N/A    Number of Children: N/A  . Years of Education: N/A   Occupational History  . Not on file.   Social History Main Topics  . Smoking status: Former Smoker -- 0.50 packs/day for 20 years    Types: Cigarettes  . Smokeless tobacco: Never Used     Comment: CURRENTLY USES [employer]  . Alcohol Use: Yes     Comment: OCCASIONAL  . Drug Use: No  . Sexual Activity: Not on file   Other Topics Concern  . Not on file   Social History Narrative  . No narrative on file    No family history on file.    Alvino Chapel, MD 03/24/2013, 1:57 PM

## 2013-04-13 DIAGNOSIS — F419 Anxiety disorder, unspecified: Secondary | ICD-10-CM

## 2013-04-13 DIAGNOSIS — F32A Depression, unspecified: Secondary | ICD-10-CM

## 2013-04-14 ENCOUNTER — Telehealth: Payer: Self-pay | Admitting: *Deleted

## 2013-04-14 NOTE — Telephone Encounter (Signed)
Pt seen at Pelvic Pain clinic by Dr. Tonye Becket on 3/19. Requested copy of note for pt's chart.

## 2013-05-01 ENCOUNTER — Encounter (HOSPITAL_BASED_OUTPATIENT_CLINIC_OR_DEPARTMENT_OTHER): Payer: BC Managed Care – PPO | Attending: Plastic Surgery

## 2013-05-01 ENCOUNTER — Other Ambulatory Visit (HOSPITAL_COMMUNITY): Payer: Self-pay | Admitting: Plastic Surgery

## 2013-05-01 DIAGNOSIS — Y842 Radiological procedure and radiotherapy as the cause of abnormal reaction of the patient, or of later complication, without mention of misadventure at the time of the procedure: Secondary | ICD-10-CM | POA: Insufficient documentation

## 2013-05-01 DIAGNOSIS — Z9289 Personal history of other medical treatment: Secondary | ICD-10-CM

## 2013-05-01 DIAGNOSIS — T66XXXS Radiation sickness, unspecified, sequela: Secondary | ICD-10-CM | POA: Insufficient documentation

## 2013-05-01 DIAGNOSIS — N3040 Irradiation cystitis without hematuria: Secondary | ICD-10-CM | POA: Insufficient documentation

## 2013-05-03 ENCOUNTER — Other Ambulatory Visit: Payer: Self-pay

## 2013-05-03 ENCOUNTER — Other Ambulatory Visit (HOSPITAL_COMMUNITY): Payer: Self-pay | Admitting: Plastic Surgery

## 2013-05-03 ENCOUNTER — Ambulatory Visit (HOSPITAL_COMMUNITY)
Admission: RE | Admit: 2013-05-03 | Discharge: 2013-05-03 | Disposition: A | Discharge status: Home / Self Care | Payer: BC Managed Care – PPO | Source: Ambulatory Visit | Attending: Plastic Surgery | Admitting: Plastic Surgery

## 2013-05-03 DIAGNOSIS — N3040 Irradiation cystitis without hematuria: Secondary | ICD-10-CM

## 2013-05-03 DIAGNOSIS — Z01810 Encounter for preprocedural cardiovascular examination: Secondary | ICD-10-CM | POA: Insufficient documentation

## 2013-05-03 DIAGNOSIS — Z01812 Encounter for preprocedural laboratory examination: Secondary | ICD-10-CM | POA: Insufficient documentation

## 2013-05-03 DIAGNOSIS — Z9289 Personal history of other medical treatment: Secondary | ICD-10-CM

## 2013-05-03 DIAGNOSIS — Z01818 Encounter for other preprocedural examination: Secondary | ICD-10-CM | POA: Insufficient documentation

## 2013-05-03 LAB — CBC WITH DIFFERENTIAL/PLATELET
BASOS ABS: 0.1 10*3/uL (ref 0.0–0.1)
BASOS PCT: 1 % (ref 0–1)
Eosinophils Absolute: 0 10*3/uL (ref 0.0–0.7)
Eosinophils Relative: 0 % (ref 0–5)
HCT: 39.8 % (ref 36.0–46.0)
Hemoglobin: 13.4 g/dL (ref 12.0–15.0)
LYMPHS PCT: 14 % (ref 12–46)
Lymphs Abs: 1.2 10*3/uL (ref 0.7–4.0)
MCH: 31.2 pg (ref 26.0–34.0)
MCHC: 33.7 g/dL (ref 30.0–36.0)
MCV: 92.8 fL (ref 78.0–100.0)
MONOS PCT: 6 % (ref 3–12)
Monocytes Absolute: 0.5 10*3/uL (ref 0.1–1.0)
NEUTROS ABS: 6.6 10*3/uL (ref 1.7–7.7)
NEUTROS PCT: 79 % — AB (ref 43–77)
Platelets: 376 10*3/uL (ref 150–400)
RBC: 4.29 MIL/uL (ref 3.87–5.11)
RDW: 12.7 % (ref 11.5–15.5)
WBC: 8.4 10*3/uL (ref 4.0–10.5)

## 2013-05-03 LAB — BASIC METABOLIC PANEL
BUN: 21 mg/dL (ref 6–23)
CO2: 28 mEq/L (ref 19–32)
Calcium: 10.2 mg/dL (ref 8.4–10.5)
Chloride: 100 mEq/L (ref 96–112)
Creatinine, Ser: 0.77 mg/dL (ref 0.50–1.10)
Glucose, Bld: 107 mg/dL — ABNORMAL HIGH (ref 70–99)
POTASSIUM: 4.8 meq/L (ref 3.7–5.3)
SODIUM: 140 meq/L (ref 137–147)

## 2013-05-03 LAB — SEDIMENTATION RATE: Sed Rate: 17 mm/hr (ref 0–22)

## 2013-05-03 LAB — PREALBUMIN: Prealbumin: 27.5 mg/dL (ref 17.0–34.0)

## 2013-05-26 ENCOUNTER — Encounter (HOSPITAL_BASED_OUTPATIENT_CLINIC_OR_DEPARTMENT_OTHER): Payer: BC Managed Care – PPO | Attending: Plastic Surgery

## 2013-05-26 DIAGNOSIS — T66XXXS Radiation sickness, unspecified, sequela: Secondary | ICD-10-CM | POA: Insufficient documentation

## 2013-05-26 DIAGNOSIS — N3040 Irradiation cystitis without hematuria: Secondary | ICD-10-CM | POA: Insufficient documentation

## 2013-05-26 DIAGNOSIS — Y842 Radiological procedure and radiotherapy as the cause of abnormal reaction of the patient, or of later complication, without mention of misadventure at the time of the procedure: Secondary | ICD-10-CM | POA: Insufficient documentation

## 2013-06-14 ENCOUNTER — Inpatient Hospital Stay (HOSPITAL_COMMUNITY)
Admission: EM | Admit: 2013-06-14 | Discharge: 2013-06-16 | DRG: 394 | Disposition: A | Discharge status: Home / Self Care | Payer: BC Managed Care – PPO | Attending: Internal Medicine | Admitting: Internal Medicine

## 2013-06-14 ENCOUNTER — Emergency Department (HOSPITAL_COMMUNITY): Payer: BC Managed Care – PPO

## 2013-06-14 ENCOUNTER — Encounter (HOSPITAL_COMMUNITY): Payer: Self-pay | Admitting: Emergency Medicine

## 2013-06-14 DIAGNOSIS — R110 Nausea: Secondary | ICD-10-CM | POA: Diagnosis present

## 2013-06-14 DIAGNOSIS — K5090 Crohn's disease, unspecified, without complications: Secondary | ICD-10-CM | POA: Diagnosis present

## 2013-06-14 DIAGNOSIS — Z79899 Other long term (current) drug therapy: Secondary | ICD-10-CM

## 2013-06-14 DIAGNOSIS — K520 Gastroenteritis and colitis due to radiation: Principal | ICD-10-CM | POA: Diagnosis present

## 2013-06-14 DIAGNOSIS — C539 Malignant neoplasm of cervix uteri, unspecified: Secondary | ICD-10-CM | POA: Diagnosis present

## 2013-06-14 DIAGNOSIS — Y842 Radiological procedure and radiotherapy as the cause of abnormal reaction of the patient, or of later complication, without mention of misadventure at the time of the procedure: Secondary | ICD-10-CM | POA: Diagnosis present

## 2013-06-14 DIAGNOSIS — Z888 Allergy status to other drugs, medicaments and biological substances status: Secondary | ICD-10-CM

## 2013-06-14 DIAGNOSIS — Z87891 Personal history of nicotine dependence: Secondary | ICD-10-CM

## 2013-06-14 DIAGNOSIS — R112 Nausea with vomiting, unspecified: Secondary | ICD-10-CM | POA: Diagnosis present

## 2013-06-14 DIAGNOSIS — Z833 Family history of diabetes mellitus: Secondary | ICD-10-CM

## 2013-06-14 DIAGNOSIS — R109 Unspecified abdominal pain: Secondary | ICD-10-CM

## 2013-06-14 DIAGNOSIS — Z880 Allergy status to penicillin: Secondary | ICD-10-CM

## 2013-06-14 DIAGNOSIS — Z881 Allergy status to other antibiotic agents status: Secondary | ICD-10-CM

## 2013-06-14 DIAGNOSIS — Z87442 Personal history of urinary calculi: Secondary | ICD-10-CM

## 2013-06-14 DIAGNOSIS — K56609 Unspecified intestinal obstruction, unspecified as to partial versus complete obstruction: Secondary | ICD-10-CM | POA: Diagnosis present

## 2013-06-14 DIAGNOSIS — N3040 Irradiation cystitis without hematuria: Secondary | ICD-10-CM | POA: Diagnosis present

## 2013-06-14 DIAGNOSIS — D72829 Elevated white blood cell count, unspecified: Secondary | ICD-10-CM | POA: Diagnosis present

## 2013-06-14 DIAGNOSIS — K219 Gastro-esophageal reflux disease without esophagitis: Secondary | ICD-10-CM | POA: Diagnosis present

## 2013-06-14 HISTORY — DX: Irradiation cystitis without hematuria: N30.40

## 2013-06-14 HISTORY — DX: Neuralgia and neuritis, unspecified: M79.2

## 2013-06-14 MED ORDER — SODIUM CHLORIDE 0.9 % IV BOLUS (SEPSIS)
1000.0000 mL | Freq: Once | INTRAVENOUS | Status: AC
  Administered 2013-06-15: 1000 mL via INTRAVENOUS

## 2013-06-14 MED ORDER — HYDROMORPHONE HCL PF 1 MG/ML IJ SOLN
1.0000 mg | Freq: Once | INTRAMUSCULAR | Status: AC
  Administered 2013-06-15: 1 mg via INTRAVENOUS
  Filled 2013-06-14: qty 1

## 2013-06-14 MED ORDER — ONDANSETRON HCL 4 MG/2ML IJ SOLN
4.0000 mg | Freq: Once | INTRAMUSCULAR | Status: AC
  Administered 2013-06-15: 4 mg via INTRAVENOUS
  Filled 2013-06-14: qty 2

## 2013-06-14 NOTE — ED Provider Notes (Signed)
CSN: [id number]     Arrival date & time 06/14/13  2240 History   First MD Initiated Contact with Patient 06/14/13 2333     Chief Complaint  Patient presents with  . Abdominal Pain     (Consider location/radiation/quality/duration/timing/severity/associated sxs/prior Treatment) HPI Comments: Patient with history of cervical cancer status post radiation in 2010, radiation cystitis -- presents with complaint of acute onset upper abdominal pain, severe nausea beginning approximately 6 PM today. Patient has not had vomiting. She denies passing gas or having bowel movements currently. No dysuria or change in urination. No fever. Patient has had previous episodes similar to this which were thought to be caused by small bowel obstruction however, she has never had imaging to prove this. Symptoms are brought on by fibrous foods. Tonight patient was eating strawberries. She's currently in [place] for hyperbaric therapy for radiation cystitis. Course is constant.  Patient is a 38 y.o. female presenting with abdominal pain. The history is provided by the patient and medical records.  Abdominal Pain Associated symptoms: nausea   Associated symptoms: no chest pain, no cough, no diarrhea, no dysuria, no fever, no sore throat and no vomiting     Past Medical History  Diagnosis Date  . Frequency of urination   . Nocturia   . Dyspareunia   . History of cervical cancer ONCOLOGIST--  [place]    DX 2010--  S/P RADIATION (EXTERNAL AND BRACHY) AND CHEMO SPRING 2010--  NO RECURRENCE  . Chronic constipation     SECONDARY TO RADIATION  . History of concussion     NO RESIDUAL  . Mild acid reflux   . History of kidney stones   . Wears glasses   . Nerve pain     legs  . Radiation cystitis    Past Surgical History  Procedure Laterality Date  . Cervical cone biopsy  FALL 2009  . Extracorporeal shock wave lithotripsy  SUMMER 2013  . Cysto/ bilateral ureteroscopic stone extractions and stent placement   FALL 2013  . Tonsillectomy   FALL 2010-- LEFT SIDE    2007--- RIGHT SIDE  AND REMOVAL BENIGN CYST  . Cystoscopy with biopsy N/A 02/03/2013    Procedure: CYSTOSCOPY WITH BIOPSY, CAUTERIZATION OF THE BIOPSY SITES INSTILLATION OF PYRIDIUM  AND RECTAL EXAM UNDER ANESTHESIA;  Surgeon: Ailene Rud, MD;  Location: [facility];  Service: Urology;  Laterality: N/A;   No family history on file. History  Substance Use Topics  . Smoking status: Former Smoker -- 0.50 packs/day for 20 years    Types: Cigarettes  . Smokeless tobacco: Never Used     Comment: CURRENTLY USES [employer]  . Alcohol Use: Yes     Comment: OCCASIONAL   OB History   Grav Para Term Preterm Abortions TAB SAB Ect Mult Living                 Review of Systems  Constitutional: Negative for fever.  HENT: Negative for rhinorrhea and sore throat.   Eyes: Negative for redness.  Respiratory: Negative for cough.   Cardiovascular: Negative for chest pain.  Gastrointestinal: Positive for nausea and abdominal pain. Negative for vomiting and diarrhea.  Genitourinary: Negative for dysuria.  Musculoskeletal: Negative for myalgias.  Skin: Negative for rash.  Neurological: Negative for headaches.      Allergies  Penicillins and Ceclor  Home Medications   Prior to Admission medications   Medication Sig Start Date End Date Taking? Authorizing Provider  Amphetamine-Dextroamphetamine (ADDERALL PO) Take  10 mg by mouth daily as needed (focus).    Yes Historical Provider, MD  ANUCORT-HC 25 MG suppository Place 25 mg rectally as needed. For rectal pain. 05/05/13  Yes Historical Provider, MD  clonazePAM (KLONOPIN) 1 MG tablet Take 1 mg by mouth at bedtime.   Yes Historical Provider, MD  diazepam (VALIUM) 2 MG tablet Place 2 mg vaginally as needed. As needed for pelvic pain 05/07/13  Yes Historical Provider, MD  DULoxetine (CYMBALTA) 30 MG capsule Take 90 mg by mouth daily.   Yes Historical Provider, MD  estradiol  (VIVELLE-DOT) 0.025 MG/24HR Place 1 patch onto the skin 2 (two) times a week.   Yes Historical Provider, MD  gabapentin (NEURONTIN) 300 MG capsule Take 300 mg by mouth 2 (two) times daily. 300 mg in the morning and 600 at bedtime. 05/12/13  Yes Historical Provider, MD  L-Methylfolate (DEPLIN) 15 MG TABS Take by mouth.   Yes Historical Provider, MD  Methen-Hyosc-Meth Blue-Na Phos (UROGESIC-BLUE) 81.6 MG TABS Take 1 tablet by mouth 3 (three) times daily as needed (for urinary pain/burning).   Yes Historical Provider, MD  oxyCODONE (OXY IR/ROXICODONE) 5 MG immediate release tablet Take 5 mg by mouth every 6 (six) hours as needed. For pain. 06/02/13  Yes Historical Provider, MD  polyethylene glycol (MIRALAX / GLYCOLAX) packet Take 17 g by mouth daily.   Yes Historical Provider, MD  progesterone (PROMETRIUM) 100 MG capsule Take 100 mg by mouth daily.   Yes Historical Provider, MD   BP 98/63  Pulse 67  Temp(Src) 97.6 F (36.4 C) (Oral)  Resp 18  SpO2 100% Physical Exam  Nursing note and vitals reviewed. Constitutional: She appears well-developed and well-nourished.  HENT:  Head: Normocephalic and atraumatic.  Eyes: Conjunctivae are normal. Right eye exhibits no discharge. Left eye exhibits no discharge.  Neck: Normal range of motion. Neck supple.  Cardiovascular: Normal rate, regular rhythm and normal heart sounds.   Pulmonary/Chest: Effort normal and breath sounds normal.  Abdominal: Soft. Bowel sounds are decreased. There is tenderness (Moderate, upper abdominal). There is no rebound and no guarding.  Severely decreased bowel sounds on right, decreased on left  Neurological: She is alert.  Skin: Skin is warm and dry.  Psychiatric: She has a normal mood and affect.    ED Course  Procedures (including critical care time) Labs Review Labs Reviewed  CBC WITH DIFFERENTIAL - Abnormal; Notable for the following:    WBC 15.9 (*)    Neutrophils Relative % 87 (*)    Neutro Abs 13.6 (*)     Lymphocytes Relative 8 (*)    All other components within normal limits  COMPREHENSIVE METABOLIC PANEL - Abnormal; Notable for the following:    Glucose, Bld 115 (*)    All other components within normal limits  URINALYSIS, ROUTINE W REFLEX MICROSCOPIC  I-STAT CG4 LACTIC ACID, ED  POC URINE PREG, ED    Imaging Review Dg Abd Acute W/chest  06/15/2013   CLINICAL DATA:  Vomiting and abdominal pain. History of cervical cancer.  EXAM: ACUTE ABDOMEN SERIES (ABDOMEN 2 VIEW & CHEST 1 VIEW)  COMPARISON:  Chest x-ray 05/03/2013.  FINDINGS: Lung volumes are normal. No consolidative airspace disease. No pleural effusions. No pneumothorax. No pulmonary nodule or mass noted. Pulmonary vasculature and the cardiomediastinal silhouette are within normal limits.  Gas and stool are seen scattered throughout the colon extending to the level of the distal rectum. No pathologic distension of small bowel is noted. No gross evidence of pneumoperitoneum.  Surgical clips in the central pelvis.  IMPRESSION: 1.  Nonobstructive bowel gas pattern. 2. No pneumoperitoneum. 3. No radiographic evidence of acute cardiopulmonary disease.   Electronically Signed   By: Vinnie Langton M.D.   On: 06/15/2013 00:28     EKG Interpretation None      11:49 PM Patient seen and examined. Work-up initiated. Medications ordered.   Vital signs reviewed and are as follows: Filed Vitals:   06/14/13 2300  BP: 98/63  Pulse: 67  Temp: 97.6 F (36.4 C)  Resp: 18   12:59 AM X-ray shows non-obstructive pattern. CT ordered. WBC elevated.   CT ordered, pt and family informed.   Handoff Olean Ree NP at shift change.   Plan: control pain, dispo per CT results.    MDM   Final diagnoses:  Abdominal pain   Pending completion of work-up.     Carlisle Cater, PA-C 06/15/13 [phone]

## 2013-06-14 NOTE — ED Notes (Signed)
Per pt report: pt c/o of abd pain in her epigastric area.  Pt has nausea.  Pt has had episodes of this kind of pain since she went through radiation. Pt reports that her oncologists thinks she has blockages from the scar tissues from the radiation treatment.

## 2013-06-15 ENCOUNTER — Emergency Department (HOSPITAL_COMMUNITY): Payer: BC Managed Care – PPO

## 2013-06-15 ENCOUNTER — Encounter (HOSPITAL_COMMUNITY): Payer: Self-pay

## 2013-06-15 DIAGNOSIS — R110 Nausea: Secondary | ICD-10-CM

## 2013-06-15 DIAGNOSIS — K56600 Partial intestinal obstruction, unspecified as to cause: Secondary | ICD-10-CM | POA: Diagnosis present

## 2013-06-15 DIAGNOSIS — K56609 Unspecified intestinal obstruction, unspecified as to partial versus complete obstruction: Secondary | ICD-10-CM

## 2013-06-15 DIAGNOSIS — D72829 Elevated white blood cell count, unspecified: Secondary | ICD-10-CM | POA: Diagnosis present

## 2013-06-15 DIAGNOSIS — R109 Unspecified abdominal pain: Secondary | ICD-10-CM

## 2013-06-15 DIAGNOSIS — C539 Malignant neoplasm of cervix uteri, unspecified: Secondary | ICD-10-CM

## 2013-06-15 DIAGNOSIS — N3040 Irradiation cystitis without hematuria: Secondary | ICD-10-CM

## 2013-06-15 LAB — CBC WITH DIFFERENTIAL/PLATELET
BASOS PCT: 0 % (ref 0–1)
Basophils Absolute: 0 10*3/uL (ref 0.0–0.1)
EOS ABS: 0.1 10*3/uL (ref 0.0–0.7)
Eosinophils Relative: 0 % (ref 0–5)
HEMATOCRIT: 37.1 % (ref 36.0–46.0)
HEMOGLOBIN: 12.7 g/dL (ref 12.0–15.0)
LYMPHS ABS: 1.3 10*3/uL (ref 0.7–4.0)
Lymphocytes Relative: 8 % — ABNORMAL LOW (ref 12–46)
MCH: 31.5 pg (ref 26.0–34.0)
MCHC: 34.2 g/dL (ref 30.0–36.0)
MCV: 92.1 fL (ref 78.0–100.0)
MONO ABS: 0.9 10*3/uL (ref 0.1–1.0)
MONOS PCT: 5 % (ref 3–12)
Neutro Abs: 13.6 10*3/uL — ABNORMAL HIGH (ref 1.7–7.7)
Neutrophils Relative %: 87 % — ABNORMAL HIGH (ref 43–77)
Platelets: 353 10*3/uL (ref 150–400)
RBC: 4.03 MIL/uL (ref 3.87–5.11)
RDW: 12.5 % (ref 11.5–15.5)
WBC: 15.9 10*3/uL — ABNORMAL HIGH (ref 4.0–10.5)

## 2013-06-15 LAB — URINALYSIS, ROUTINE W REFLEX MICROSCOPIC
Bilirubin Urine: NEGATIVE
GLUCOSE, UA: NEGATIVE mg/dL
Hgb urine dipstick: NEGATIVE
LEUKOCYTES UA: NEGATIVE
NITRITE: NEGATIVE
Protein, ur: NEGATIVE mg/dL
Specific Gravity, Urine: 1.016 (ref 1.005–1.030)
Urobilinogen, UA: 0.2 mg/dL (ref 0.0–1.0)
pH: 8.5 — ABNORMAL HIGH (ref 5.0–8.0)

## 2013-06-15 LAB — COMPREHENSIVE METABOLIC PANEL
ALBUMIN: 4.3 g/dL (ref 3.5–5.2)
ALK PHOS: 49 U/L (ref 39–117)
ALT: 14 U/L (ref 0–35)
AST: 20 U/L (ref 0–37)
BUN: 16 mg/dL (ref 6–23)
CO2: 26 mEq/L (ref 19–32)
CREATININE: 0.76 mg/dL (ref 0.50–1.10)
Calcium: 10.2 mg/dL (ref 8.4–10.5)
Chloride: 98 mEq/L (ref 96–112)
GFR calc non Af Amer: >90 mL/min (ref >90)
GLUCOSE: 115 mg/dL — AB (ref 70–99)
Potassium: 4.1 mEq/L (ref 3.7–5.3)
Sodium: 140 mEq/L (ref 137–147)
Total Bilirubin: 0.4 mg/dL (ref 0.3–1.2)
Total Protein: 7.5 g/dL (ref 6.0–8.3)

## 2013-06-15 LAB — CBC
HCT: 34.2 % — ABNORMAL LOW (ref 36.0–46.0)
Hemoglobin: 11.2 g/dL — ABNORMAL LOW (ref 12.0–15.0)
MCH: 30.3 pg (ref 26.0–34.0)
MCHC: 32.7 g/dL (ref 30.0–36.0)
MCV: 92.4 fL (ref 78.0–100.0)
PLATELETS: 313 10*3/uL (ref 150–400)
RBC: 3.7 MIL/uL — AB (ref 3.87–5.11)
RDW: 12.5 % (ref 11.5–15.5)
WBC: 11.4 10*3/uL — AB (ref 4.0–10.5)

## 2013-06-15 LAB — BASIC METABOLIC PANEL
BUN: 13 mg/dL (ref 6–23)
CALCIUM: 8.9 mg/dL (ref 8.4–10.5)
CO2: 26 mEq/L (ref 19–32)
Chloride: 105 mEq/L (ref 96–112)
Creatinine, Ser: 0.71 mg/dL (ref 0.50–1.10)
GFR calc Af Amer: >90 mL/min (ref >90)
Glucose, Bld: 100 mg/dL — ABNORMAL HIGH (ref 70–99)
POTASSIUM: 4.3 meq/L (ref 3.7–5.3)
SODIUM: 140 meq/L (ref 137–147)

## 2013-06-15 LAB — POC URINE PREG, ED: PREG TEST UR: NEGATIVE

## 2013-06-15 LAB — I-STAT CG4 LACTIC ACID, ED: Lactic Acid, Venous: 1.41 mmol/L (ref 0.5–2.2)

## 2013-06-15 MED ORDER — ESTRADIOL 0.05 MG/24HR TD PTWK
0.0500 mg | MEDICATED_PATCH | TRANSDERMAL | Status: DC
  Filled 2013-06-15: qty 1

## 2013-06-15 MED ORDER — URELLE 81 MG PO TABS
1.0000 | ORAL_TABLET | Freq: Three times a day (TID) | ORAL | Status: DC | PRN
  Administered 2013-06-16 (×2): 81 mg via ORAL
  Filled 2013-06-15 (×2): qty 1

## 2013-06-15 MED ORDER — IOHEXOL 300 MG/ML  SOLN
100.0000 mL | Freq: Once | INTRAMUSCULAR | Status: AC | PRN
  Administered 2013-06-15: 100 mL via INTRAVENOUS

## 2013-06-15 MED ORDER — HYDROMORPHONE HCL PF 1 MG/ML IJ SOLN
0.5000 mg | INTRAMUSCULAR | Status: DC | PRN
  Administered 2013-06-15: 1 mg via INTRAVENOUS
  Filled 2013-06-15: qty 1

## 2013-06-15 MED ORDER — OXYCODONE HCL 5 MG PO TABS
5.0000 mg | ORAL_TABLET | ORAL | Status: DC | PRN
  Administered 2013-06-16: 5 mg via ORAL
  Filled 2013-06-15: qty 1

## 2013-06-15 MED ORDER — ONDANSETRON HCL 4 MG/2ML IJ SOLN
4.0000 mg | Freq: Four times a day (QID) | INTRAMUSCULAR | Status: DC | PRN
  Administered 2013-06-15: 4 mg via INTRAVENOUS
  Filled 2013-06-15: qty 2

## 2013-06-15 MED ORDER — GABAPENTIN 300 MG PO CAPS
300.0000 mg | ORAL_CAPSULE | Freq: Two times a day (BID) | ORAL | Status: DC
  Administered 2013-06-15: 300 mg via ORAL
  Filled 2013-06-15 (×2): qty 1

## 2013-06-15 MED ORDER — PROGESTERONE MICRONIZED 100 MG PO CAPS
100.0000 mg | ORAL_CAPSULE | Freq: Every day | ORAL | Status: DC
  Filled 2013-06-15: qty 1

## 2013-06-15 MED ORDER — HYDROCORTISONE ACETATE 25 MG RE SUPP
25.0000 mg | Freq: Two times a day (BID) | RECTAL | Status: DC | PRN
  Filled 2013-06-15: qty 1

## 2013-06-15 MED ORDER — SODIUM CHLORIDE 0.9 % IV BOLUS (SEPSIS)
1000.0000 mL | Freq: Once | INTRAVENOUS | Status: AC
  Administered 2013-06-15: 1000 mL via INTRAVENOUS

## 2013-06-15 MED ORDER — PROGESTERONE MICRONIZED 100 MG PO CAPS
100.0000 mg | ORAL_CAPSULE | Freq: Once | ORAL | Status: AC
  Administered 2013-06-15: 100 mg via ORAL
  Filled 2013-06-15: qty 1

## 2013-06-15 MED ORDER — IOHEXOL 300 MG/ML  SOLN
50.0000 mL | Freq: Once | INTRAMUSCULAR | Status: AC | PRN
  Administered 2013-06-15: 50 mL via ORAL

## 2013-06-15 MED ORDER — PANTOPRAZOLE SODIUM 40 MG IV SOLR: 40.0000 mg | Freq: Two times a day (BID) | INTRAVENOUS | Status: DC

## 2013-06-15 MED ORDER — SODIUM CHLORIDE 0.9 % IV SOLN
INTRAVENOUS | Status: DC
  Administered 2013-06-15 – 2013-06-16 (×2): via INTRAVENOUS

## 2013-06-15 MED ORDER — HYDROMORPHONE HCL PF 1 MG/ML IJ SOLN
1.0000 mg | Freq: Once | INTRAMUSCULAR | Status: AC
Start: 2013-06-15 — End: 2013-06-15
  Administered 2013-06-15: 1 mg via INTRAVENOUS
  Filled 2013-06-15: qty 1

## 2013-06-15 MED ORDER — CLONAZEPAM 1 MG PO TABS
1.0000 mg | ORAL_TABLET | Freq: Every day | ORAL | Status: DC
  Administered 2013-06-15: 1 mg via ORAL
  Filled 2013-06-15: qty 1

## 2013-06-15 MED ORDER — PANTOPRAZOLE SODIUM 40 MG IV SOLR
40.0000 mg | Freq: Two times a day (BID) | INTRAVENOUS | Status: DC
  Filled 2013-06-15: qty 40

## 2013-06-15 MED ORDER — ONDANSETRON HCL 4 MG/2ML IJ SOLN
4.0000 mg | Freq: Once | INTRAMUSCULAR | Status: AC
  Administered 2013-06-15: 4 mg via INTRAVENOUS
  Filled 2013-06-15: qty 2

## 2013-06-15 MED ORDER — ONDANSETRON HCL 4 MG PO TABS: 4.0000 mg | ORAL_TABLET | Freq: Four times a day (QID) | ORAL | Status: DC | PRN

## 2013-06-15 MED ORDER — ALUM & MAG HYDROXIDE-SIMETH 200-200-20 MG/5ML PO SUSP: 30.0000 mL | Freq: Four times a day (QID) | ORAL | Status: DC | PRN

## 2013-06-15 MED ORDER — DULOXETINE HCL 60 MG PO CPEP
90.0000 mg | ORAL_CAPSULE | Freq: Every day | ORAL | Status: DC
  Filled 2013-06-15: qty 1

## 2013-06-15 MED ORDER — ACETAMINOPHEN 650 MG RE SUPP: 650.0000 mg | Freq: Four times a day (QID) | RECTAL | Status: DC | PRN

## 2013-06-15 MED ORDER — FAMOTIDINE IN NACL 20-0.9 MG/50ML-% IV SOLN
20.0000 mg | Freq: Two times a day (BID) | INTRAVENOUS | Status: DC
  Administered 2013-06-15 – 2013-06-16 (×2): 20 mg via INTRAVENOUS
  Filled 2013-06-15 (×5): qty 50

## 2013-06-15 MED ORDER — ENOXAPARIN SODIUM 40 MG/0.4ML ~~LOC~~ SOLN
40.0000 mg | SUBCUTANEOUS | Status: DC
  Administered 2013-06-15 – 2013-06-16 (×2): 40 mg via SUBCUTANEOUS
  Filled 2013-06-15 (×2): qty 0.4

## 2013-06-15 MED ORDER — ACETAMINOPHEN 325 MG PO TABS
650.0000 mg | ORAL_TABLET | Freq: Four times a day (QID) | ORAL | Status: DC | PRN
  Administered 2013-06-15: 650 mg via ORAL
  Filled 2013-06-15: qty 2

## 2013-06-15 NOTE — Progress Notes (Signed)
Patient ID: Sue Benson, female   DOB: [birth year], 38 y.o.   MRN: [id number]  [facility] PROGRESS NOTE  Sue Benson [id number] DOB: 24-Sep-1975 DOA: 06/14/2013 PCP: [name]  Brief narrative: 38 y.o. female with a history of Cervical Cancer Diagnosed in 2010, S/P Radiation Rx and Chemotherapy performed at [facility] who presented to the ED with severe and sharp, persistent abdominal pain that started several hours prior to this admission, 10/10 in severity, associated with cramps, nausea but no vomiting, poor oral intake. In ED, CT abdomen notable for partial SBO and [facility] asked to admit for further management.   Assessment/Plan:  Principal Problem:   SBO (small bowel obstruction)  Pt reports feeling better this AM but still with nausea  Tolerating clear liquids  Will continue supportive care with IVF, analgesia, antiemetics as needed  Place on bowel regimen senna and miralax  Active Problems:   Cervix cancer  Follows at [facility], underwent radiation and chemotherapy   Chronic radiation cystitis  Appears to be stable at this point    Nausea and abdominal pain  Secondary to partial SBO  Clinically improving  Management as noted above   Leukocytosis  Likely from stress reaction in the setting of acute partial SBO rather than infectious etiology  No need for ABX at this time, WBC is trending down   Repeat CBC in AM   Code Status: Full  Family Communication: Pt at bedside  Disposition Plan: Home when medically stable  DVT prophylaxis: Lovenox  Robbie Lis, MD  [facility] Pager [phone]  If 7PM-7AM, please contact night-coverage [email or web address] Password [facility] 06/15/2013, 10:43 AM   LOS: 1 day   Consultants:  None Procedures/Studies:  Ct Abdomen Pelvis W Contrast  06/15/2013  Partial small bowel obstruction, with transition point in the right lower pelvis, associated with small bowel wall thickening edema which may reflect radiation enteritis.  Small amount of free fluid in the pelvis is likely reactive, without abscess. Moderate amount of retained large bowel stool.   Dg Abd Acute W/chest  06/15/2013  Nonobstructive bowel gas pattern. No pneumoperitoneum. No radiographic evidence of acute cardiopulmonary disease.   Antibiotics:  None  HPI/Subjective: No events overnight. PT reports tolerating clear liquid diet well.   Objective: Filed Vitals:   06/14/13 2300 06/15/13 0243 06/15/13 0433  BP: 98/63 97/63 96/60   Pulse: 67 84 69  Temp: 97.6 F (36.4 C)  97.8 F (36.6 C)  TempSrc: Oral  Oral  Resp: 18 18 18   Height:   5\' 3"  (1.6 m)  Weight:   53.071 kg (117 lb)  SpO2: 100% 95% 100%    Intake/Output Summary (Last 24 hours) at 06/15/13 1043 Last data filed at 06/15/13 0600  Gross per 24 hour  Intake    125 ml  Output      0 ml  Net    125 ml    Exam:   General:  Pt is alert, follows commands appropriately, not in acute distress  Cardiovascular: Regular rate and rhythm, S1/S2, no murmurs, no rubs, no gallops  Respiratory: Clear to auscultation bilaterally, no wheezing, no crackles, no rhonchi  Abdomen: Soft, tender in epigastric area, non distended, bowel sounds present, no guarding  Extremities: No edema, pulses DP and PT palpable bilaterally  Neuro: Grossly nonfocal  Data Reviewed: Basic Metabolic Panel:  Recent Labs Lab 06/15/13 0010 06/15/13 0515  NA 140 140  K 4.1 4.3  CL 98 105  CO2 26 26  GLUCOSE 115* 100*  BUN 16 13  CREATININE 0.76 0.71  CALCIUM 10.2 8.9   Liver Function Tests:  Recent Labs Lab 06/15/13 0010  AST 20  ALT 14  ALKPHOS 49  BILITOT 0.4  PROT 7.5  ALBUMIN 4.3   CBC:  Recent Labs Lab 06/15/13 0010 06/15/13 0515  WBC 15.9* 11.4*  NEUTROABS 13.6*  --   HGB 12.7 11.2*  HCT 37.1 34.2*  MCV 92.1 92.4  PLT 353 313   Scheduled Meds: . clonazePAM  1 mg Oral QHS  . DULoxetine  90 mg Oral Daily  . enoxaparin (LOVENOX) injection  40 mg Subcutaneous Q24H  . [START  ON 06/16/2013] estradiol  0.05 mg Transdermal Once per day on Tue Fri  . famotidine (PEPCID) IV  20 mg Intravenous Q12H  . gabapentin  300 mg Oral BID  . progesterone  100 mg Oral Daily   Continuous Infusions: . sodium chloride 100 mL/hr at 06/15/13 [phone]

## 2013-06-15 NOTE — H&P (Addendum)
[facility] History and Physical  Sue Benson [id number] DOB: 08/31/1975 DOA: 06/14/2013  Referring physician: EDP PCP: [name]  Specialists:   Chief Complaint: ABD Pain and  Nausea  HPI: Sue Benson is a 38 y.o. female with a history of Cervical Cancer Diagnosed in 2010 S/P Radiation Rx and Chemotherapy performed at [facility] who presents to the ED with complaints of severe ABD pain and nausea since 5 pm.   She reports 10/10 crampy ABD pain and the pain starts in the mid ABD area and radiates upward into the Epigastrium.   She has had severe nausea but no vomiting.   She denies constipation or diarrhea.   She reports that she has had intermittent similar symptoms since her cancer treatment.  She denies having fevers or chills.  She was evaluated in the ED and was found to have a Partial SBO on CT scan and was referred for medical admission.       Review of Systems:  Constitutional: No Weight Loss, No Weight Gain, Night Sweats, Fevers, Chills, Fatigue, or Generalized Weakness HEENT: No Headaches, Difficulty Swallowing,Tooth/Dental Problems,Sore Throat,  No Sneezing, Rhinitis, Ear Ache, Nasal Congestion, or Post Nasal Drip,  Cardio-vascular:  No Chest pain, Orthopnea, PND, Edema in lower extremities, Anasarca, Dizziness, Palpitations  Resp: No Dyspnea, No DOE, No Productive Cough, No Non-Productive Cough, No Hemoptysis, No Change in Color of Mucus,  No Wheezing.    GI: No Heartburn, Indigestion, +Abdominal Pain, +Nausea, Vomiting, Diarrhea, Change in Bowel Habits,  Loss of Appetite  GU: No Dysuria, Change in Color of Urine, No Urgency or Frequency.  No flank pain.  Musculoskeletal: No Joint Pain or Swelling.  No Decreased Range of Motion. No Back Pain.  Neurologic: No Syncope, No Seizures, Muscle Weakness, Paresthesia, Vision Disturbance or Loss, No Diplopia, No Vertigo, No Difficulty Walking,  Skin: No Rash or Lesions. Psych: No Change in Mood or Affect. No Depression or  Anxiety. No Memory loss. No Confusion or Hallucinations   Past Medical History  Diagnosis Date  . Frequency of urination   . Nocturia   . Dyspareunia   . History of cervical cancer ONCOLOGIST--  [place]    DX 2010--  S/P RADIATION (EXTERNAL AND BRACHY) AND CHEMO SPRING 2010--  NO RECURRENCE  . Chronic constipation     SECONDARY TO RADIATION  . History of concussion     NO RESIDUAL  . Mild acid reflux   . History of kidney stones   . Wears glasses   . Nerve pain     legs  . Radiation cystitis       Past Surgical History  Procedure Laterality Date  . Cervical cone biopsy  FALL 2009  . Extracorporeal shock wave lithotripsy  SUMMER 2013  . Cysto/ bilateral ureteroscopic stone extractions and stent placement  FALL 2013  . Tonsillectomy   FALL 2010-- LEFT SIDE    2007--- RIGHT SIDE  AND REMOVAL BENIGN CYST  . Cystoscopy with biopsy N/A 02/03/2013    Procedure: CYSTOSCOPY WITH BIOPSY, CAUTERIZATION OF THE BIOPSY SITES INSTILLATION OF PYRIDIUM  AND RECTAL EXAM UNDER ANESTHESIA;  Surgeon: Ailene Rud, MD;  Location: [facility];  Service: Urology;  Laterality: N/A;       Prior to Admission medications   Medication Sig Start Date End Date Taking? Authorizing Provider  Amphetamine-Dextroamphetamine (ADDERALL PO) Take 10 mg by mouth daily as needed (focus).    Yes Historical Provider, MD  ANUCORT-HC 25 MG suppository Place  25 mg rectally as needed. For rectal pain. 05/05/13  Yes Historical Provider, MD  clonazePAM (KLONOPIN) 1 MG tablet Take 1 mg by mouth at bedtime.   Yes Historical Provider, MD  diazepam (VALIUM) 2 MG tablet Place 2 mg vaginally as needed. As needed for pelvic pain 05/07/13  Yes Historical Provider, MD  DULoxetine (CYMBALTA) 30 MG capsule Take 90 mg by mouth daily.   Yes Historical Provider, MD  estradiol (VIVELLE-DOT) 0.025 MG/24HR Place 1 patch onto the skin 2 (two) times a week.   Yes Historical Provider, MD  gabapentin (NEURONTIN) 300 MG  capsule Take 300 mg by mouth 2 (two) times daily. 300 mg in the morning and 600 at bedtime. 05/12/13  Yes Historical Provider, MD  L-Methylfolate (DEPLIN) 15 MG TABS Take by mouth.   Yes Historical Provider, MD  Methen-Hyosc-Meth Blue-Na Phos (UROGESIC-BLUE) 81.6 MG TABS Take 1 tablet by mouth 3 (three) times daily as needed (for urinary pain/burning).   Yes Historical Provider, MD  oxyCODONE (OXY IR/ROXICODONE) 5 MG immediate release tablet Take 5 mg by mouth every 6 (six) hours as needed. For pain. 06/02/13  Yes Historical Provider, MD  polyethylene glycol (MIRALAX / GLYCOLAX) packet Take 17 g by mouth daily.   Yes Historical Provider, MD  progesterone (PROMETRIUM) 100 MG capsule Take 100 mg by mouth daily.   Yes Historical Provider, MD      Allergies  Allergen Reactions  . Penicillins Anaphylaxis  . Prilosec [Omeprazole] Other (See Comments)    Patient states that it "shut her stomach down too much."  . Ceclor [Cefaclor] Rash     Social History:  reports that she has quit smoking. Her smoking use included Cigarettes. She has a 10 pack-year smoking history. She has never used smokeless tobacco. She reports that she drinks alcohol. She reports that she does not use illicit drugs.     Family History:      Diabetes-Mother  Fibromyalgia-Mother  Myasthenia Gravis -Father    Physical Exam:  GEN:  Pleasant  Thin  38 y.o. Caucasian female  examined  and in  Discomfort but no acute distress; cooperative with exam Filed Vitals:   06/14/13 2300 06/15/13 0243  BP: 98/63 97/63  Pulse: 67 84  Temp: 97.6 F (36.4 C)   TempSrc: Oral   Resp: 18 18  SpO2: 100% 95%   Blood pressure 97/63, pulse 84, temperature 97.6 F (36.4 C), temperature source Oral, resp. rate 18, SpO2 95.00%. PSYCH: She is alert and oriented x4; does not appear anxious does not appear depressed; affect is normal HEENT: Normocephalic and Atraumatic, Mucous membranes pink; PERRLA; EOM intact; Fundi:  Benign;  No scleral  icterus, Nares: Patent, Oropharynx: Clear,  Fair Dentition, Neck:  FROM, no cervical lymphadenopathy nor thyromegaly or carotid bruit; no JVD; Breasts:: Not examined CHEST WALL: No tenderness CHEST: Normal respiration, clear to auscultation bilaterally HEART: Regular rate and rhythm; no murmurs rubs or gallops BACK: No kyphosis or scoliosis; no CVA tenderness ABDOMEN: Positive Bowel Sounds, Scaphoid, soft non-tender; no masses, no organomegaly Rectal Exam: Not done EXTREMITIES: No cyanosis, clubbing or edema; no ulcerations. Genitalia: not examined PULSES: 2+ and symmetric SKIN: Normal hydration no rash or ulceration CNS:  A x O x 4, No Focal Deficits Vascular: pulses palpable throughout    Labs on Admission:  Basic Metabolic Panel:  Recent Labs Lab 06/15/13 0010  NA 140  K 4.1  CL 98  CO2 26  GLUCOSE 115*  BUN 16  CREATININE 0.76  CALCIUM  10.2   Liver Function Tests:  Recent Labs Lab 06/15/13 0010  AST 20  ALT 14  ALKPHOS 49  BILITOT 0.4  PROT 7.5  ALBUMIN 4.3   No results found for this basename: LIPASE, AMYLASE,  in the last 168 hours No results found for this basename: AMMONIA,  in the last 168 hours CBC:  Recent Labs Lab 06/15/13 0010  WBC 15.9*  NEUTROABS 13.6*  HGB 12.7  HCT 37.1  MCV 92.1  PLT 353   Cardiac Enzymes: No results found for this basename: CKTOTAL, CKMB, CKMBINDEX, TROPONINI,  in the last 168 hours  BNP (last 3 results) No results found for this basename: PROBNP,  in the last 8760 hours CBG: No results found for this basename: GLUCAP,  in the last 168 hours  Radiological Exams on Admission: Ct Abdomen Pelvis W Contrast  06/15/2013   CLINICAL DATA:  Cervical cancer, chemoradiation. Kidney stones, and cystitis, now with abdominal pain and vomiting.  EXAM: CT ABDOMEN AND PELVIS WITH CONTRAST  TECHNIQUE: Multidetector CT imaging of the abdomen and pelvis was performed using the standard protocol following bolus administration of  intravenous contrast.  CONTRAST:  142mL OMNIPAQUE IOHEXOL 300 MG/ML  SOLN  COMPARISON:  DG ABD ACUTE W/CHEST dated 06/15/2013  FINDINGS: Included view of the lung bases are clear. Visualized heart and pericardium are unremarkable.  The spleen, gallbladder, pancreas and adrenal glands are unremarkable. 9 mm irregular hypodensity within the dome of the liver, axial 8/88, could reflect hemangioma or cyst. Focal fatty infiltration at the falciform ligament, the liver is otherwise unremarkable.  Small bowel is dilated in the pelvis of to 2.7 cm, with transition point in the right lower quadrant, coronal 39 fat 66, at a point of diffuse bowel wall thickening and edema. Moderate amount of retained large bowel stool with air. No pneumatosis. Small amount of free fluid in the pelvis is likely physiologic. The stomach, large bowel are normal in course and caliber without inflammatory changes. The appendix is not discretely identified, however there are no inflammatory changes in the right lower quadrant. No intraperitoneal free air.  Kidneys are orthotopic, demonstrating symmetric enhancement without nephrolithiasis, hydronephrosis or renal masses. The unopacified ureters are normal in course and caliber. Delayed imaging through the kidneys demonstrates symmetric prompt excretion to the proximal urinary collecting system. Urinary bladder is completely decompressed, limiting assessment for cystitis given history.  Aortoiliac vessels are normal in course and caliber. No lymphadenopathy by CT size criteria. Surgical clips in the pelvis, with apparent small residual uterus. The soft tissues and included osseous structures are nonsuspicious.  IMPRESSION: Partial small bowel obstruction, with transition point in the right lower pelvis, associated with small bowel wall thickening edema which may reflect radiation enteritis. Small amount of free fluid in the pelvis is likely reactive, without abscess. Moderate amount of retained large  bowel stool.   Electronically Signed   By: Elon Alas   On: 06/15/2013 02:58   Dg Abd Acute W/chest  06/15/2013   CLINICAL DATA:  Vomiting and abdominal pain. History of cervical cancer.  EXAM: ACUTE ABDOMEN SERIES (ABDOMEN 2 VIEW & CHEST 1 VIEW)  COMPARISON:  Chest x-ray 05/03/2013.  FINDINGS: Lung volumes are normal. No consolidative airspace disease. No pleural effusions. No pneumothorax. No pulmonary nodule or mass noted. Pulmonary vasculature and the cardiomediastinal silhouette are within normal limits.  Gas and stool are seen scattered throughout the colon extending to the level of the distal rectum. No pathologic distension of small bowel is  noted. No gross evidence of pneumoperitoneum. Surgical clips in the central pelvis.  IMPRESSION: 1.  Nonobstructive bowel gas pattern. 2. No pneumoperitoneum. 3. No radiographic evidence of acute cardiopulmonary disease.   Electronically Signed   By: Vinnie Langton M.D.   On: 06/15/2013 00:28      Assessment/Plan:   38 y.o. female with  Principal Problem:   SBO (small bowel obstruction) Active Problems:   Nausea   Abdominal pain   Cervix cancer   Chronic radiation cystitis   Leukocytosis    1.   Partial SBO-  Supportive Care, Anti-Emetics PRN, and Pain Control PRN, and IVFs,  Clear Liquids and Advance as tolerated to Regular Diet.   If worsening of Sxs, consider Gen Surgical  Consult.    2.   Nausea and ABD Pain due to #1.     3.   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     4.   Chronic Radiation cystitis - sequelae of Radiation Rx,  Continue Pyridium Rx PRN.     5.   Leukocytosis-  Probable Stress Rxn, Monitor Trend.     6.   DVT prophylaxis       Code Status:    FULL CODE Family Communication:   Mother at Bedside   Disposition Plan:     Inpatient  Time spent:  [facility] Pager [phone]  If  7PM-7AM, please contact night-coverage [email or web address] Password [facility] 06/15/2013, 4:15 AM

## 2013-06-15 NOTE — ED Provider Notes (Signed)
CT scan results. .  Partial small bowel obstruction with transition point and edema.  Discussed this with patient, who agrees for admission Patient placed NPO with additional IV fluids  Garald Balding, NP 06/15/13 0347  Garald Balding, NP 06/15/13 [phone]

## 2013-06-15 NOTE — ED Notes (Signed)
Patient transported to CT 

## 2013-06-15 NOTE — ED Notes (Signed)
Protonix IV held due to patient reporting adverse reaction to similar medication in the past Dr. Arnoldo Morale at bedside and per MD, medication to be held

## 2013-06-15 NOTE — Progress Notes (Signed)
Pt. Blood pressure was found to be 75/47. MD notified new orders were given. Will continue to monitor pt.

## 2013-06-15 NOTE — ED Provider Notes (Signed)
Medical screening examination/treatment/procedure(s) were performed by non-physician practitioner and as supervising physician I was immediately available for consultation/collaboration.   EKG Interpretation None       Kalman Drape, MD 06/15/13 [phone]

## 2013-06-15 NOTE — ED Provider Notes (Signed)
Medical screening examination/treatment/procedure(s) were performed by non-physician practitioner and as supervising physician I was immediately available for consultation/collaboration.   EKG Interpretation None       Kalman Drape, MD 06/15/13 [phone]

## 2013-06-15 NOTE — Care Management Note (Signed)
CARE MANAGEMENT NOTE 06/15/2013  Patient:  Sue Benson, Sue Benson   Account Number:  [id number]  Date Initiated:  06/15/2013  Documentation initiated by:  [place]  Subjective/Objective Assessment:   38 yo female admitted with SBO.     Action/Plan:   Home when stable   Anticipated DC Date:     Anticipated DC Plan:  [place]  CM consult      Choice offered to / List presented to:  NA   DME arranged  NA      DME agency  NA     [place] arranged  NA      [place] agency  NA   Status of service:  In process, will continue to follow Medicare Important Message given?   (If response is "NO", the following Medicare IM given date fields will be blank) Date Medicare IM given:   Date Additional Medicare IM given:    Discharge Disposition:    Per UR Regulation:  Reviewed for med. necessity/level of care/duration of stay  If discussed at [place] of Stay Meetings, dates discussed:    Comments:  06/15/13 [place] Chart reviewed for utilization of services. No needs identified at this time.

## 2013-06-15 NOTE — ED Notes (Signed)
Dr. Jenkins at bedside. 

## 2013-06-16 DIAGNOSIS — K520 Gastroenteritis and colitis due to radiation: Principal | ICD-10-CM

## 2013-06-16 LAB — BASIC METABOLIC PANEL
BUN: 6 mg/dL (ref 6–23)
CHLORIDE: 110 meq/L (ref 96–112)
CO2: 24 meq/L (ref 19–32)
CREATININE: 0.74 mg/dL (ref 0.50–1.10)
Calcium: 8.6 mg/dL (ref 8.4–10.5)
GFR calc Af Amer: >90 mL/min (ref >90)
GFR calc non Af Amer: >90 mL/min (ref >90)
GLUCOSE: 79 mg/dL (ref 70–99)
POTASSIUM: 4.3 meq/L (ref 3.7–5.3)
Sodium: 141 mEq/L (ref 137–147)

## 2013-06-16 LAB — CBC
HEMATOCRIT: 29.1 % — AB (ref 36.0–46.0)
HEMOGLOBIN: 9.5 g/dL — AB (ref 12.0–15.0)
MCH: 30.6 pg (ref 26.0–34.0)
MCHC: 32.6 g/dL (ref 30.0–36.0)
MCV: 93.9 fL (ref 78.0–100.0)
Platelets: 273 10*3/uL (ref 150–400)
RBC: 3.1 MIL/uL — AB (ref 3.87–5.11)
RDW: 13 % (ref 11.5–15.5)
WBC: 4 10*3/uL (ref 4.0–10.5)

## 2013-06-16 MED ORDER — ESTRADIOL 0.05 MG/24HR TD PTWK
0.0500 mg | MEDICATED_PATCH | TRANSDERMAL | Status: DC
  Administered 2013-06-16: 0.05 mg via TRANSDERMAL
  Filled 2013-06-16: qty 1

## 2013-06-16 MED ORDER — DULOXETINE HCL 60 MG PO CPEP
60.0000 mg | ORAL_CAPSULE | Freq: Every day | ORAL | Status: DC
  Administered 2013-06-16: 60 mg via ORAL
  Filled 2013-06-16 (×2): qty 1

## 2013-06-16 MED ORDER — POLYETHYLENE GLYCOL 3350 17 G PO PACK
17.0000 g | PACK | Freq: Every day | ORAL | Status: DC | PRN
  Filled 2013-06-16: qty 1

## 2013-06-16 MED ORDER — DULOXETINE HCL 60 MG PO CPEP: 90.0000 mg | ORAL_CAPSULE | Freq: Every day | ORAL | Status: DC

## 2013-06-16 MED ORDER — DULOXETINE HCL 60 MG PO CPEP
90.0000 mg | ORAL_CAPSULE | Freq: Every day | ORAL | Status: DC
  Filled 2013-06-16 (×2): qty 1

## 2013-06-16 MED ORDER — DULOXETINE HCL 30 MG PO CPEP
30.0000 mg | ORAL_CAPSULE | Freq: Every day | ORAL | Status: DC
  Filled 2013-06-16: qty 1

## 2013-06-16 MED ORDER — GABAPENTIN 300 MG PO CAPS
600.0000 mg | ORAL_CAPSULE | Freq: Every day | ORAL | Status: DC
  Filled 2013-06-16 (×2): qty 2

## 2013-06-16 MED ORDER — URELLE 81 MG PO TABS
1.0000 | ORAL_TABLET | Freq: Two times a day (BID) | ORAL | Status: DC
  Filled 2013-06-16: qty 1

## 2013-06-16 MED ORDER — GABAPENTIN 300 MG PO CAPS
300.0000 mg | ORAL_CAPSULE | Freq: Every day | ORAL | Status: DC
  Administered 2013-06-16: 300 mg via ORAL
  Filled 2013-06-16: qty 1

## 2013-06-16 NOTE — Discharge Summary (Signed)
Physician Discharge Summary  Sue Benson WUJ:[id number] DOB: January 14, 1976 DOA: 06/14/2013  PCP: [name]  Admit date: 06/14/2013 Discharge date: 06/16/2013   Recommendations for Outpatient Follow-Up:   1. Recommend outpatient referral to GI if symptoms persist.   Discharge Diagnosis:   Principal Problem:    Radiation enteritis Active Problems:    Cervix cancer    Chronic radiation cystitis    SBO (small bowel obstruction)    Nausea    Abdominal pain    Leukocytosis   Discharge Condition: Improved.  Diet recommendation: Low residue.   History of Present Illness:   38 y.o. female with a history of Cervical Cancer Diagnosed in 2010, S/P Radiation Rx and Chemotherapy performed at [facility] who presented to the ED on 06/15/13 with severe and sharp, persistent abdominal pain that started several hours prior to this admission, 10/10 in severity, associated with cramps, nausea but no vomiting, poor oral intake. In ED, CT abdomen notable for partial SBO and TRH asked to admit for further management.   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  Treated conservatively with bowel rest anti-emetics and analgesics.  Diet successfully advanced prior to D/C.  Advised to continue low residue diet. Active Problems:   Cervix cancer  F/U at [facility].   Chronic radiation cystitis  Stable.   Leukocytosis  Resolved.    Procedures:    None.   Medical Consultants:    None.   Discharge Exam:   Filed Vitals:   06/16/13 1454  BP: 86/49  Pulse: 58  Temp: 97.8 F (36.6 C)  Resp: 17   Filed Vitals:   06/16/13 0222 06/16/13 1052 06/16/13 1146 06/16/13 1454  BP: 101/54 82/41 85/53  86/49  Pulse: 63 56  58  Temp: 97.4 F (36.3 C) 97.6 F (36.4 C)  97.8 F (36.6 C)  TempSrc:  Oral Oral  Oral  Resp: 14 16  17   Height:      Weight:      SpO2: 100% 98%  100%    Gen:  NAD Cardiovascular:  RRR, No M/R/G Respiratory: Lungs CTAB Gastrointestinal: Abdomen soft, NT/ND with normal active bowel sounds. Extremities: No C/E/C    Discharge Instructions:   Discharge Instructions   Activity as tolerated - No restrictions    Complete by:  As directed      Call MD for:  persistant nausea and vomiting    Complete by:  As directed      Call MD for:  severe uncontrolled pain    Complete by:  As directed      Call MD for:  temperature >100.4    Complete by:  As directed      Diet general    Complete by:  As directed   Low fiber.     Discharge instructions    Complete by:  As directed   Avoid foods high in fiber such as fruits, vegetables, and whole grains.  If your symptoms return, resume bowel rest with sips of clear liquids every 10 minutes as tolerated. If you have persistent vomiting, come back to the emergency room. If you have severe, uncontrolled pain or fever, and back to the emergency room.            Medication List         ADDERALL PO  Take 10  mg by mouth daily as needed (focus).     ANUCORT-HC 25 MG suppository  Generic drug:  hydrocortisone  Place 25 mg rectally as needed. For rectal pain.     clonazePAM 1 MG tablet  Commonly known as:  KLONOPIN  Take 1 mg by mouth at bedtime.     DEPLIN 15 MG Tabs  Take by mouth.     diazepam 2 MG tablet  Commonly known as:  VALIUM  Place 2 mg vaginally as needed. As needed for pelvic pain     DULoxetine 30 MG capsule  Commonly known as:  CYMBALTA  Take 30-60 mg by mouth 2 (two) times daily. Takes 60mg  in am and 30mg  in pm, updated 5/22     estradiol 0.025 MG/24HR  Commonly known as:  VIVELLE-DOT  Place 1 patch onto the skin 2 (two) times a week.     gabapentin 300 MG capsule  Commonly known as:  NEURONTIN  Take 300 mg by mouth 2 (two) times daily. 300 mg in the morning and 600 at bedtime.       oxyCODONE 5 MG immediate release tablet  Commonly known as:  Oxy IR/ROXICODONE  Take 5 mg by mouth every 6 (six) hours as needed. For pain.     polyethylene glycol packet  Commonly known as:  MIRALAX / GLYCOLAX  Take 17 g by mouth daily.     progesterone 100 MG capsule  Commonly known as:  PROMETRIUM  Take 100 mg by mouth daily.     UROGESIC-BLUE 81.6 MG Tabs  Take 1 tablet by mouth 2 (two) times daily. Patient stated 5/22 she takes this med bid, not tid prn          The results of significant diagnostics from this hospitalization (including imaging, microbiology, ancillary and laboratory) are listed below for reference.     Significant Diagnostic Studies:   Radiographs: Ct Abdomen Pelvis W Contrast  06/15/2013   CLINICAL DATA:  Cervical cancer, chemoradiation. Kidney stones, and cystitis, now with abdominal pain and vomiting.  EXAM: CT ABDOMEN AND PELVIS WITH CONTRAST  TECHNIQUE: Multidetector CT imaging of the abdomen and pelvis was performed using the standard protocol following bolus administration of intravenous contrast.  CONTRAST:  166mL OMNIPAQUE IOHEXOL 300 MG/ML  SOLN  COMPARISON:  DG ABD ACUTE W/CHEST dated 06/15/2013  FINDINGS: Included view of the lung bases are clear. Visualized heart and pericardium are unremarkable.  The spleen, gallbladder, pancreas and adrenal glands are unremarkable. 9 mm irregular hypodensity within the dome of the liver, axial 8/88, could reflect hemangioma or cyst. Focal fatty infiltration at the falciform ligament, the liver is otherwise unremarkable.  Small bowel is dilated in the pelvis of to 2.7 cm, with transition point in the right lower quadrant, coronal 39 fat 66, at a point of diffuse bowel wall thickening and edema. Moderate amount of retained large bowel stool with air. No pneumatosis. Small amount of free fluid in the pelvis is likely physiologic. The stomach, large bowel are normal in course and caliber without inflammatory changes. The  appendix is not discretely identified, however there are no inflammatory changes in the right lower quadrant. No intraperitoneal free air.  Kidneys are orthotopic, demonstrating symmetric enhancement without nephrolithiasis, hydronephrosis or renal masses. The unopacified ureters are normal in course and caliber. Delayed imaging through the kidneys demonstrates symmetric prompt excretion to the proximal urinary collecting system. Urinary bladder is completely decompressed, limiting assessment for cystitis given history.  Aortoiliac vessels are normal in course  and caliber. No lymphadenopathy by CT size criteria. Surgical clips in the pelvis, with apparent small residual uterus. The soft tissues and included osseous structures are nonsuspicious.  IMPRESSION: Partial small bowel obstruction, with transition point in the right lower pelvis, associated with small bowel wall thickening edema which may reflect radiation enteritis. Small amount of free fluid in the pelvis is likely reactive, without abscess. Moderate amount of retained large bowel stool.   Electronically Signed   By: Elon Alas   On: 06/15/2013 02:58   Dg Abd Acute W/chest  06/15/2013   CLINICAL DATA:  Vomiting and abdominal pain. History of cervical cancer.  EXAM: ACUTE ABDOMEN SERIES (ABDOMEN 2 VIEW & CHEST 1 VIEW)  COMPARISON:  Chest x-ray 05/03/2013.  FINDINGS: Lung volumes are normal. No consolidative airspace disease. No pleural effusions. No pneumothorax. No pulmonary nodule or mass noted. Pulmonary vasculature and the cardiomediastinal silhouette are within normal limits.  Gas and stool are seen scattered throughout the colon extending to the level of the distal rectum. No pathologic distension of small bowel is noted. No gross evidence of pneumoperitoneum. Surgical clips in the central pelvis.  IMPRESSION: 1.  Nonobstructive bowel gas pattern. 2. No pneumoperitoneum. 3. No radiographic evidence of acute cardiopulmonary disease.    Electronically Signed   By: Vinnie Langton M.D.   On: 06/15/2013 00:28    Labs:  Basic Metabolic Panel:  Recent Labs Lab 06/15/13 0010 06/15/13 0515 06/16/13 0340  NA 140 140 141  K 4.1 4.3 4.3  CL 98 105 110  CO2 26 26 24   GLUCOSE 115* 100* 79  BUN 16 13 6   CREATININE 0.76 0.71 0.74  CALCIUM 10.2 8.9 8.6   GFR Estimated Creatinine Clearance: 78.1 ml/min (by C-G formula based on Cr of 0.74). Liver Function Tests:  Recent Labs Lab 06/15/13 0010  AST 20  ALT 14  ALKPHOS 49  BILITOT 0.4  PROT 7.5  ALBUMIN 4.3    CBC:  Recent Labs Lab 06/15/13 0010 06/15/13 0515 06/16/13 0340  WBC 15.9* 11.4* 4.0  NEUTROABS 13.6*  --   --   HGB 12.7 11.2* 9.5*  HCT 37.1 34.2* 29.1*  MCV 92.1 92.4 93.9  PLT 353 313 273    Time coordinating discharge: 45 minutes.  SignedVenetia Maxon Sherrika Weakland  Pager [phone] [facility] 06/16/2013, 10:05 PM

## 2013-06-26 ENCOUNTER — Encounter (HOSPITAL_BASED_OUTPATIENT_CLINIC_OR_DEPARTMENT_OTHER): Payer: BC Managed Care – PPO | Attending: Plastic Surgery

## 2013-06-26 DIAGNOSIS — N3040 Irradiation cystitis without hematuria: Secondary | ICD-10-CM | POA: Insufficient documentation

## 2013-06-26 DIAGNOSIS — T66XXXS Radiation sickness, unspecified, sequela: Secondary | ICD-10-CM | POA: Insufficient documentation

## 2013-06-26 DIAGNOSIS — Y842 Radiological procedure and radiotherapy as the cause of abnormal reaction of the patient, or of later complication, without mention of misadventure at the time of the procedure: Secondary | ICD-10-CM | POA: Insufficient documentation

## 2013-06-26 LAB — GLUCOSE, CAPILLARY: Glucose-Capillary: 201 mg/dL — ABNORMAL HIGH (ref 70–99)

## 2013-07-26 ENCOUNTER — Encounter (HOSPITAL_BASED_OUTPATIENT_CLINIC_OR_DEPARTMENT_OTHER): Payer: BC Managed Care – PPO | Attending: Plastic Surgery

## 2013-07-26 DIAGNOSIS — T66XXXS Radiation sickness, unspecified, sequela: Secondary | ICD-10-CM | POA: Insufficient documentation

## 2013-07-26 DIAGNOSIS — N3040 Irradiation cystitis without hematuria: Secondary | ICD-10-CM | POA: Insufficient documentation

## 2013-07-26 DIAGNOSIS — Y842 Radiological procedure and radiotherapy as the cause of abnormal reaction of the patient, or of later complication, without mention of misadventure at the time of the procedure: Secondary | ICD-10-CM | POA: Insufficient documentation

## 2014-01-25 ENCOUNTER — Other Ambulatory Visit: Payer: Self-pay | Admitting: Internal Medicine

## 2014-01-25 DIAGNOSIS — M8580 Other specified disorders of bone density and structure, unspecified site: Secondary | ICD-10-CM

## 2014-01-25 DIAGNOSIS — Z1231 Encounter for screening mammogram for malignant neoplasm of breast: Secondary | ICD-10-CM

## 2014-02-12 ENCOUNTER — Ambulatory Visit
Admission: RE | Admit: 2014-02-12 | Discharge: 2014-02-12 | Disposition: A | Discharge status: Home / Self Care | Payer: BLUE CROSS/BLUE SHIELD | Source: Ambulatory Visit | Attending: Internal Medicine | Admitting: Internal Medicine

## 2014-02-12 DIAGNOSIS — Z1231 Encounter for screening mammogram for malignant neoplasm of breast: Secondary | ICD-10-CM

## 2014-02-12 DIAGNOSIS — M8580 Other specified disorders of bone density and structure, unspecified site: Secondary | ICD-10-CM

## 2014-02-26 DIAGNOSIS — G8929 Other chronic pain: Secondary | ICD-10-CM | POA: Insufficient documentation

## 2014-06-28 ENCOUNTER — Encounter (HOSPITAL_COMMUNITY): Payer: Self-pay | Admitting: *Deleted

## 2014-06-28 ENCOUNTER — Emergency Department (HOSPITAL_COMMUNITY): Payer: BLUE CROSS/BLUE SHIELD

## 2014-06-28 ENCOUNTER — Inpatient Hospital Stay (HOSPITAL_COMMUNITY)
Admission: EM | Admit: 2014-06-28 | Discharge: 2014-06-30 | DRG: 389 | Disposition: A | Discharge status: Home / Self Care | Payer: BLUE CROSS/BLUE SHIELD | Attending: Internal Medicine | Admitting: Internal Medicine

## 2014-06-28 DIAGNOSIS — G8929 Other chronic pain: Secondary | ICD-10-CM | POA: Diagnosis present

## 2014-06-28 DIAGNOSIS — E860 Dehydration: Secondary | ICD-10-CM | POA: Diagnosis present

## 2014-06-28 DIAGNOSIS — Z833 Family history of diabetes mellitus: Secondary | ICD-10-CM | POA: Diagnosis not present

## 2014-06-28 DIAGNOSIS — R001 Bradycardia, unspecified: Secondary | ICD-10-CM | POA: Diagnosis present

## 2014-06-28 DIAGNOSIS — D72829 Elevated white blood cell count, unspecified: Secondary | ICD-10-CM | POA: Diagnosis present

## 2014-06-28 DIAGNOSIS — M79606 Pain in leg, unspecified: Secondary | ICD-10-CM | POA: Diagnosis present

## 2014-06-28 DIAGNOSIS — Z8541 Personal history of malignant neoplasm of cervix uteri: Secondary | ICD-10-CM

## 2014-06-28 DIAGNOSIS — K56600 Partial intestinal obstruction, unspecified as to cause: Secondary | ICD-10-CM

## 2014-06-28 DIAGNOSIS — R109 Unspecified abdominal pain: Secondary | ICD-10-CM | POA: Diagnosis not present

## 2014-06-28 DIAGNOSIS — Z888 Allergy status to other drugs, medicaments and biological substances status: Secondary | ICD-10-CM

## 2014-06-28 DIAGNOSIS — Z79899 Other long term (current) drug therapy: Secondary | ICD-10-CM | POA: Diagnosis not present

## 2014-06-28 DIAGNOSIS — N3040 Irradiation cystitis without hematuria: Secondary | ICD-10-CM | POA: Diagnosis present

## 2014-06-28 DIAGNOSIS — Z9221 Personal history of antineoplastic chemotherapy: Secondary | ICD-10-CM | POA: Diagnosis not present

## 2014-06-28 DIAGNOSIS — Y842 Radiological procedure and radiotherapy as the cause of abnormal reaction of the patient, or of later complication, without mention of misadventure at the time of the procedure: Secondary | ICD-10-CM | POA: Diagnosis present

## 2014-06-28 DIAGNOSIS — Z87891 Personal history of nicotine dependence: Secondary | ICD-10-CM

## 2014-06-28 DIAGNOSIS — R351 Nocturia: Secondary | ICD-10-CM | POA: Diagnosis present

## 2014-06-28 DIAGNOSIS — Z923 Personal history of irradiation: Secondary | ICD-10-CM

## 2014-06-28 DIAGNOSIS — E2740 Unspecified adrenocortical insufficiency: Secondary | ICD-10-CM

## 2014-06-28 DIAGNOSIS — K5669 Other intestinal obstruction: Secondary | ICD-10-CM | POA: Diagnosis not present

## 2014-06-28 DIAGNOSIS — Z880 Allergy status to penicillin: Secondary | ICD-10-CM | POA: Diagnosis not present

## 2014-06-28 DIAGNOSIS — D638 Anemia in other chronic diseases classified elsewhere: Secondary | ICD-10-CM

## 2014-06-28 DIAGNOSIS — I959 Hypotension, unspecified: Secondary | ICD-10-CM

## 2014-06-28 DIAGNOSIS — F419 Anxiety disorder, unspecified: Secondary | ICD-10-CM | POA: Diagnosis present

## 2014-06-28 DIAGNOSIS — Z79891 Long term (current) use of opiate analgesic: Secondary | ICD-10-CM

## 2014-06-28 DIAGNOSIS — N941 Dyspareunia: Secondary | ICD-10-CM | POA: Diagnosis present

## 2014-06-28 DIAGNOSIS — K520 Gastroenteritis and colitis due to radiation: Secondary | ICD-10-CM

## 2014-06-28 DIAGNOSIS — K56609 Unspecified intestinal obstruction, unspecified as to partial versus complete obstruction: Secondary | ICD-10-CM | POA: Diagnosis present

## 2014-06-28 DIAGNOSIS — R350 Frequency of micturition: Secondary | ICD-10-CM | POA: Diagnosis present

## 2014-06-28 DIAGNOSIS — Z87442 Personal history of urinary calculi: Secondary | ICD-10-CM

## 2014-06-28 DIAGNOSIS — F329 Major depressive disorder, single episode, unspecified: Secondary | ICD-10-CM | POA: Diagnosis present

## 2014-06-28 DIAGNOSIS — K219 Gastro-esophageal reflux disease without esophagitis: Secondary | ICD-10-CM | POA: Diagnosis present

## 2014-06-28 DIAGNOSIS — C539 Malignant neoplasm of cervix uteri, unspecified: Secondary | ICD-10-CM | POA: Diagnosis present

## 2014-06-28 HISTORY — DX: Depression, unspecified: F32.A

## 2014-06-28 HISTORY — DX: Major depressive disorder, single episode, unspecified: F32.9

## 2014-06-28 LAB — URINALYSIS, ROUTINE W REFLEX MICROSCOPIC
BILIRUBIN URINE: NEGATIVE
Glucose, UA: NEGATIVE mg/dL
HGB URINE DIPSTICK: NEGATIVE
Ketones, ur: NEGATIVE mg/dL
Leukocytes, UA: NEGATIVE
Nitrite: NEGATIVE
PH: 8.5 — AB (ref 5.0–8.0)
Protein, ur: NEGATIVE mg/dL
Specific Gravity, Urine: 1.012 (ref 1.005–1.030)
UROBILINOGEN UA: 0.2 mg/dL (ref 0.0–1.0)

## 2014-06-28 LAB — COMPREHENSIVE METABOLIC PANEL
ALK PHOS: 50 U/L (ref 38–126)
ALT: 20 U/L (ref 14–54)
AST: 23 U/L (ref 15–41)
Albumin: 4.1 g/dL (ref 3.5–5.0)
Anion gap: 9 (ref 5–15)
BUN: 18 mg/dL (ref 6–20)
CHLORIDE: 102 mmol/L (ref 101–111)
CO2: 25 mmol/L (ref 22–32)
Calcium: 9.5 mg/dL (ref 8.9–10.3)
Creatinine, Ser: 0.84 mg/dL (ref 0.44–1.00)
GFR calc Af Amer: >60 mL/min (ref >60)
GFR calc non Af Amer: >60 mL/min (ref >60)
Glucose, Bld: 114 mg/dL — ABNORMAL HIGH (ref 65–99)
Potassium: 3.8 mmol/L (ref 3.5–5.1)
SODIUM: 136 mmol/L (ref 135–145)
Total Bilirubin: 0.4 mg/dL (ref 0.3–1.2)
Total Protein: 7.1 g/dL (ref 6.5–8.1)

## 2014-06-28 LAB — CBC WITH DIFFERENTIAL/PLATELET
Basophils Absolute: 0.1 10*3/uL (ref 0.0–0.1)
Basophils Relative: 0 % (ref 0–1)
EOS ABS: 0.1 10*3/uL (ref 0.0–0.7)
Eosinophils Relative: 1 % (ref 0–5)
HEMATOCRIT: 36.3 % (ref 36.0–46.0)
Hemoglobin: 12.4 g/dL (ref 12.0–15.0)
LYMPHS PCT: 13 % (ref 12–46)
Lymphs Abs: 1.6 10*3/uL (ref 0.7–4.0)
MCH: 30.7 pg (ref 26.0–34.0)
MCHC: 34.2 g/dL (ref 30.0–36.0)
MCV: 89.9 fL (ref 78.0–100.0)
MONOS PCT: 7 % (ref 3–12)
Monocytes Absolute: 0.8 10*3/uL (ref 0.1–1.0)
NEUTROS ABS: 9 10*3/uL — AB (ref 1.7–7.7)
Neutrophils Relative %: 79 % — ABNORMAL HIGH (ref 43–77)
Platelets: 358 10*3/uL (ref 150–400)
RBC: 4.04 MIL/uL (ref 3.87–5.11)
RDW: 13.2 % (ref 11.5–15.5)
WBC: 11.6 10*3/uL — ABNORMAL HIGH (ref 4.0–10.5)

## 2014-06-28 LAB — POC URINE PREG, ED: PREG TEST UR: NEGATIVE

## 2014-06-28 LAB — LIPASE, BLOOD: Lipase: 17 U/L — ABNORMAL LOW (ref 22–51)

## 2014-06-28 MED ORDER — ONDANSETRON HCL 4 MG/2ML IJ SOLN
4.0000 mg | Freq: Once | INTRAMUSCULAR | Status: AC
  Administered 2014-06-28: 4 mg via INTRAVENOUS
  Filled 2014-06-28: qty 2

## 2014-06-28 MED ORDER — ONDANSETRON HCL 4 MG/2ML IJ SOLN
4.0000 mg | Freq: Four times a day (QID) | INTRAMUSCULAR | Status: DC | PRN
  Administered 2014-06-28: 4 mg via INTRAVENOUS
  Filled 2014-06-28: qty 2

## 2014-06-28 MED ORDER — HYDROMORPHONE HCL 1 MG/ML IJ SOLN
1.0000 mg | INTRAMUSCULAR | Status: DC | PRN
Start: 2014-06-28 — End: 2014-06-29

## 2014-06-28 MED ORDER — ENOXAPARIN SODIUM 40 MG/0.4ML ~~LOC~~ SOLN
40.0000 mg | SUBCUTANEOUS | Status: DC
  Administered 2014-06-28 – 2014-06-30 (×3): 40 mg via SUBCUTANEOUS
  Filled 2014-06-28 (×3): qty 0.4

## 2014-06-28 MED ORDER — FAMOTIDINE IN NACL 20-0.9 MG/50ML-% IV SOLN
20.0000 mg | Freq: Two times a day (BID) | INTRAVENOUS | Status: DC
  Administered 2014-06-28 – 2014-06-30 (×5): 20 mg via INTRAVENOUS
  Filled 2014-06-28 (×7): qty 50

## 2014-06-28 MED ORDER — PROMETHAZINE HCL 25 MG/ML IJ SOLN
12.5000 mg | Freq: Four times a day (QID) | INTRAMUSCULAR | Status: DC | PRN
  Administered 2014-06-28: 12.5 mg via INTRAVENOUS
  Filled 2014-06-28: qty 1

## 2014-06-28 MED ORDER — MORPHINE SULFATE 4 MG/ML IJ SOLN
4.0000 mg | Freq: Once | INTRAMUSCULAR | Status: AC
  Administered 2014-06-28: 4 mg via INTRAVENOUS
  Filled 2014-06-28: qty 1

## 2014-06-28 MED ORDER — KETOROLAC TROMETHAMINE 30 MG/ML IJ SOLN
30.0000 mg | Freq: Three times a day (TID) | INTRAMUSCULAR | Status: DC
  Administered 2014-06-29 – 2014-06-30 (×5): 30 mg via INTRAVENOUS
  Filled 2014-06-28 (×9): qty 1

## 2014-06-28 MED ORDER — IOHEXOL 300 MG/ML  SOLN
50.0000 mL | Freq: Once | INTRAMUSCULAR | Status: AC | PRN
  Administered 2014-06-28: 50 mL via ORAL

## 2014-06-28 MED ORDER — IOHEXOL 300 MG/ML  SOLN
100.0000 mL | Freq: Once | INTRAMUSCULAR | Status: AC | PRN
  Administered 2014-06-28: 100 mL via INTRAVENOUS

## 2014-06-28 MED ORDER — LORAZEPAM 2 MG/ML IJ SOLN: 1.0000 mg | INTRAMUSCULAR | Status: DC | PRN

## 2014-06-28 MED ORDER — HYDROMORPHONE HCL 1 MG/ML IJ SOLN
1.0000 mg | Freq: Once | INTRAMUSCULAR | Status: AC
  Administered 2014-06-28: 1 mg via INTRAVENOUS
  Filled 2014-06-28: qty 1

## 2014-06-28 MED ORDER — AMITRIPTYLINE HCL 10 MG PO TABS
10.0000 mg | ORAL_TABLET | Freq: Every day | ORAL | Status: DC
  Administered 2014-06-28 – 2014-06-29 (×2): 10 mg via ORAL
  Filled 2014-06-28 (×3): qty 1

## 2014-06-28 MED ORDER — HYDROCORTISONE ACETATE 25 MG RE SUPP
25.0000 mg | Freq: Two times a day (BID) | RECTAL | Status: DC | PRN
  Filled 2014-06-28: qty 1

## 2014-06-28 MED ORDER — KCL IN DEXTROSE-NACL 20-5-0.45 MEQ/L-%-% IV SOLN
INTRAVENOUS | Status: DC
  Administered 2014-06-28: 11:00:00 via INTRAVENOUS
  Administered 2014-06-28: 125 mL/h via INTRAVENOUS
  Administered 2014-06-29: 22:00:00 via INTRAVENOUS
  Administered 2014-06-29: 125 mL/h via INTRAVENOUS
  Administered 2014-06-29 – 2014-06-30 (×2): via INTRAVENOUS
  Filled 2014-06-28 (×8): qty 1000

## 2014-06-28 MED ORDER — DULOXETINE HCL 60 MG PO CPEP
60.0000 mg | ORAL_CAPSULE | Freq: Every day | ORAL | Status: DC
  Administered 2014-06-28 – 2014-06-30 (×3): 60 mg via ORAL
  Filled 2014-06-28 (×3): qty 1

## 2014-06-28 MED ORDER — DULOXETINE HCL 30 MG PO CPEP
30.0000 mg | ORAL_CAPSULE | Freq: Every day | ORAL | Status: DC
  Administered 2014-06-28 – 2014-06-29 (×2): 30 mg via ORAL
  Filled 2014-06-28 (×3): qty 1

## 2014-06-28 MED ORDER — CETYLPYRIDINIUM CHLORIDE 0.05 % MT LIQD
7.0000 mL | Freq: Two times a day (BID) | OROMUCOSAL | Status: DC
  Administered 2014-06-28 – 2014-06-30 (×5): 7 mL via OROMUCOSAL

## 2014-06-28 MED ORDER — ONDANSETRON HCL 4 MG PO TABS: 4.0000 mg | ORAL_TABLET | Freq: Four times a day (QID) | ORAL | Status: DC | PRN

## 2014-06-28 MED ORDER — SODIUM CHLORIDE 0.9 % IV BOLUS (SEPSIS)
1000.0000 mL | Freq: Once | INTRAVENOUS | Status: AC
  Administered 2014-06-28: 1000 mL via INTRAVENOUS

## 2014-06-28 MED ORDER — SODIUM CHLORIDE 0.9 % IV SOLN: INTRAVENOUS | Status: DC

## 2014-06-28 MED ORDER — PROMETHAZINE HCL 25 MG/ML IJ SOLN
25.0000 mg | Freq: Once | INTRAMUSCULAR | Status: AC
  Administered 2014-06-28: 25 mg via INTRAVENOUS
  Filled 2014-06-28: qty 1

## 2014-06-28 MED ORDER — FENTANYL 25 MCG/HR TD PT72
25.0000 ug | MEDICATED_PATCH | TRANSDERMAL | Status: DC
  Administered 2014-06-28: 25 ug via TRANSDERMAL
  Filled 2014-06-28: qty 1

## 2014-06-28 MED ORDER — DULOXETINE HCL 30 MG PO CPEP: 30.0000 mg | ORAL_CAPSULE | Freq: Two times a day (BID) | ORAL | Status: DC

## 2014-06-28 MED ORDER — ESTRADIOL 0.05 MG/24HR TD PTWK
0.0500 mg | MEDICATED_PATCH | TRANSDERMAL | Status: DC
  Filled 2014-06-28: qty 1

## 2014-06-28 MED ORDER — SODIUM CHLORIDE 0.9 % IV SOLN
INTRAVENOUS | Status: DC
  Administered 2014-06-28: 02:00:00 via INTRAVENOUS

## 2014-06-28 NOTE — H&P (Signed)
[facility] History and Physical  Sue Benson TGG:[id number] DOB: [birth year] DOA: 06/28/2014  Referring physician:  Pryor Curia PCP:  Lottie Dawson, MD   Chief Complaint:  Abdominal pain and nausea  HPI:  The patient is a 39 y.o. year-old female with history of cervical cancer diagnosed in 2010 s/p radiation and chemotherapy, partial SBO related to radiation enteritis in May 2015, radiation cystitis followed by urology, chronic pain and severe MDD, who presents with abdominal pain and nausea.  The patient was last at their baseline health the day prior to admission.  She states she normally has a BM about twice a week secondary to narcotic use but recently these have been more frequent (almost daily) since eating more fruit.  She believes her last BM was about three days ago and she has not had flatulence in the last two days that she can recall.  She developed sudden onset epigastric pain that comes in waves and is severe as 10/10 at its worst and does not go completely away between waves.  It does not radiate.  She has had nausea with heaves but only one episode of NB/NB emesis.   In the ER, VS notable for mild hypotension.   CT scan demonstrated distal small bowel inflammation suggesting enteritis with proximal partial obstruction.  She did not have relief with morphine but did have relief with dilaudid/phenergan cocktail.  No NG tube was placed.    Review of Systems:  General:  Denies fevers, chills, weight loss or gain HEENT:  Denies changes to hearing and vision, rhinorrhea, sinus congestion, sore throat CV:  Denies chest pain and palpitations, lower extremity edema.  PULM:  Denies SOB, wheezing, cough.   GI:  Per HPI GU:  Denies dysuria, frequency, urgency ENDO:  Denies polyuria, polydipsia.   HEME:  Denies hematemesis, blood in stools, melena, abnormal bruising or bleeding.  LYMPH:  Denies lymphadenopathy.   MSK:  Chronic abdominal pain.  Denies arthralgias,  positive chronic myalgias.   DERM:  Denies skin rash or ulcer.   NEURO:  Denies focal numbness, weakness, slurred speech, confusion, facial droop.  PSYCH:  Severe anxiety and depression.    Past Medical History  Diagnosis Date  . Frequency of urination   . Nocturia   . Dyspareunia   . History of cervical cancer ONCOLOGIST--  [place]    DX 2010--  S/P RADIATION (EXTERNAL AND BRACHY) AND CHEMO SPRING 2010--  NO RECURRENCE  . Chronic constipation     SECONDARY TO RADIATION  . History of concussion     NO RESIDUAL  . Mild acid reflux   . History of kidney stones   . Wears glasses   . Nerve pain     legs  . Radiation cystitis   . Depression    Past Surgical History  Procedure Laterality Date  . Cervical cone biopsy  FALL 2009  . Extracorporeal shock wave lithotripsy  SUMMER 2013  . Cysto/ bilateral ureteroscopic stone extractions and stent placement  FALL 2013  . Tonsillectomy   FALL 2010-- LEFT SIDE    2007--- RIGHT SIDE  AND REMOVAL BENIGN CYST  . Cystoscopy with biopsy N/A 02/03/2013    Procedure: CYSTOSCOPY WITH BIOPSY, CAUTERIZATION OF THE BIOPSY SITES INSTILLATION OF PYRIDIUM  AND RECTAL EXAM UNDER ANESTHESIA;  Surgeon: Ailene Rud, MD;  Location: [facility];  Service: Urology;  Laterality: N/A;   Social History:  reports that she has quit smoking. Her smoking use included Cigarettes. She  has a 10 pack-year smoking history. She has never used smokeless tobacco. She reports that she does not drink alcohol or use illicit drugs.  Allergies  Allergen Reactions  . Penicillins Anaphylaxis  . Prilosec [Omeprazole] Other (See Comments)    Patient states that it "shut her stomach down too much."  . Ceclor [Cefaclor] Rash    Family History  Problem Relation Age of Onset  . Diabetes Mother   . Fibromyalgia Mother   . Myasthenia gravis Father      Prior to Admission medications   Medication Sig Start Date End Date Taking? Authorizing Provider   amitriptyline (ELAVIL) 10 MG tablet Take 1 tablet by mouth daily. 06/22/14  Yes Historical Provider, MD  Amphetamine-Dextroamphetamine (ADDERALL PO) Take 10 mg by mouth daily as needed (focus).    Yes Historical Provider, MD  ANUCORT-HC 25 MG suppository Place 25 mg rectally as needed. For rectal pain. 05/05/13  Yes Historical Provider, MD  clonazePAM (KLONOPIN) 1 MG tablet Take 1 mg by mouth at bedtime.   Yes Historical Provider, MD  DULoxetine (CYMBALTA) 30 MG capsule Take 30-60 mg by mouth 2 (two) times daily. Takes 60mg  in am and 30mg  in pm, updated 5/22   Yes Historical Provider, MD  estradiol (VIVELLE-DOT) 0.025 MG/24HR Place 1 patch onto the skin 2 (two) times a week.   Yes Historical Provider, MD  fentaNYL (DURAGESIC - DOSED MCG/HR) 25 MCG/HR patch Place 25 mcg onto the skin every 3 (three) days. 06/01/14  Yes Historical Provider, MD  GRALISE 300 MG TABS Take 1 tablet by mouth daily. 06/04/14  Yes Historical Provider, MD  L-Methylfolate (DEPLIN) 15 MG TABS Take 1 tablet by mouth daily.    Yes Historical Provider, MD  Methen-Hyosc-Meth Blue-Na Phos (UROGESIC-BLUE) 81.6 MG TABS Take 1 tablet by mouth 2 (two) times daily. Patient stated 5/22 she takes this med bid, not tid prn   Yes Historical Provider, MD  oxyCODONE (OXY IR/ROXICODONE) 5 MG immediate release tablet Take 5 mg by mouth every 6 (six) hours as needed for moderate pain.  06/02/13  Yes Historical Provider, MD  polyethylene glycol (MIRALAX / GLYCOLAX) packet Take 17 g by mouth daily.   Yes Historical Provider, MD  progesterone (PROMETRIUM) 100 MG capsule Take 100 mg by mouth daily.   Yes Historical Provider, MD   Physical Exam: Filed Vitals:   06/28/14 0330 06/28/14 0356 06/28/14 0527 06/28/14 0731  BP: 107/71 107/71 102/58 89/57  Pulse: 54 58 90 65  Temp:    98.4 F (36.9 C)  TempSrc:    Oral  Resp: 16 18 16 16   Height:      Weight:      SpO2: 100% 100% 94% 99%     General:  Thin F, NAD  Eyes:  PERRL, anicteric,  non-injected.  ENT:  Nares clear.  OP clear, non-erythematous without plaques or exudates.  MMM.  Neck:  Supple without TM or JVD.    Lymph:  No cervical, supraclavicular, or submandibular LAD.  Cardiovascular:  RRR, normal S1, S2, without m/r/g.  2+ pulses, warm extremities  Respiratory:  CTA bilaterally without increased WOB.  Abdomen:  Absent BS.  Soft, ND/NT.    Skin:  No rashes or focal lesions.  Musculoskeletal:  Normal bulk and tone.  No LE edema.  Psychiatric:  A & O x 4.  Appropriate affect.  Neurologic:  CN 3-12 intact.  5/5 strength.  Sensation intact.  Labs on Admission:  Basic Metabolic Panel:  Recent Labs Lab  06/28/14 0227  NA 136  K 3.8  CL 102  CO2 25  GLUCOSE 114*  BUN 18  CREATININE 0.84  CALCIUM 9.5   Liver Function Tests:  Recent Labs Lab 06/28/14 0227  AST 23  ALT 20  ALKPHOS 50  BILITOT 0.4  PROT 7.1  ALBUMIN 4.1    Recent Labs Lab 06/28/14 0227  LIPASE 17*   No results for input(s): AMMONIA in the last 168 hours. CBC:  Recent Labs Lab 06/28/14 0227  WBC 11.6*  NEUTROABS 9.0*  HGB 12.4  HCT 36.3  MCV 89.9  PLT 358   Cardiac Enzymes: No results for input(s): CKTOTAL, CKMB, CKMBINDEX, TROPONINI in the last 168 hours.  BNP (last 3 results) No results for input(s): BNP in the last 8760 hours.  ProBNP (last 3 results) No results for input(s): PROBNP in the last 8760 hours.  CBG: No results for input(s): GLUCAP in the last 168 hours.  Radiological Exams on Admission: Ct Abdomen Pelvis W Contrast  06/28/2014   CLINICAL DATA:  Abdominal pain, cramping and nausea for 1 day. History of cervical cancer post radiation and chemotherapy.  EXAM: CT ABDOMEN AND PELVIS WITH CONTRAST  TECHNIQUE: Multidetector CT imaging of the abdomen and pelvis was performed using the standard protocol following bolus administration of intravenous contrast.  CONTRAST:  135mL OMNIPAQUE IOHEXOL 300 MG/ML  SOLN  COMPARISON:  CT 06/15/2013  FINDINGS:  The included lung bases are clear.  Subcentimeter hypodensity in the right hepatic dome, unchanged. Focal fatty infiltration adjacent with falciform ligament. The gallbladder is minimally distended. The spleen, pancreas, and adrenal glands are normal. Kidneys demonstrate symmetric enhancement without hydronephrosis or perinephric stranding.  The stomach is physiologically distended with contrast. The proximal small bowel loops are normal. Fluid-filled dilated small bowel loops in the central and right lower pelvis with feculization of small bowel contents. This is just proximal to a moderate length segment of small bowel thickening with adjacent inflammatory change, transition point best appreciated on coronal image 27/73. There is adjacent mesenteric edema and small amount of free fluid. No pneumatosis, perforation or abscess. The appearance and distribution is similar to that of prior exam. There is a moderate volume of stool throughout the colon without colonic wall thickening. The appendix is not definitively identified.  No retroperitoneal adenopathy. Abdominal aorta is normal in caliber. In stents note a retro aortic left renal vein.  Within the pelvis the urinary bladder is physiologically distended. Uterus is small with fiducial markers in the region of the cervix. Small amount of free fluid in the pelvis. No inguinal or pelvic adenopathy.  There are no acute or suspicious osseous abnormalities.  IMPRESSION: Distal small bowel inflammation with proximal dilatation, suggesting enteritis with partial proximal obstruction. The overall appearance appears similar to that of prior exam. 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 Small amount of mesenteric free fluid and free fluid in the pelvis is likely reactive, no perforation or abscess.   Electronically Signed   By: Jeb Levering M.D.   On: 06/28/2014 05:28    EKG: pending  Assessment/Plan Active Problems:   Partial small bowel  obstruction  ---  Partial SBO related to radiation enteritis -  NPO  -  IV zofran, phenergan -  toradol and IV dilaudid for pain -  Continue fentanyl patch -  General surgery consultation -  Will notify GYN-[facility] of her admission -  IVF with dextrose for now and if not improving, may need to consider TPN and/or  surgery -  NG tube only if vomiting worsens  Moderate MDD -  Patient requests that her home medications be continued even though she has been counseled that absorption is unreliable in the setting of SBO -  Add prn ativan IV in place of oral clonazepam -  Continue cymbalta and amitriptyline per patient request  Hx of cervical cancer followed by Dr[facility] Margaretmary Bayley at [facility] and Dr. Royal Hawthorn XRT-ONC.   -  Paged Dr. Thurston Pounds to notify of admission and awaiting call back.    Mild hypotension and leukocytosis likely related to dehydration from SBO -  NS bolus followed by IVF  Diet:  NPO Access:  PIV IVF:  yes Proph:  lovenox  Code Status: full Family Communication: patient and her mother Disposition Plan: Admit to med-surg  Time spent: 60 min Janece Canterbury [facility] Pager [phone]  If 7PM-7AM, please contact night-coverage [email or web address] Password [email or web address] 06/28/2014, 8:21 AM

## 2014-06-28 NOTE — ED Notes (Signed)
Pt states has a hx of partial bowel obstructions, states having abdominal cramping and nausea, states has not vomited or had diarrhea, last BM was yesterday.

## 2014-06-28 NOTE — Progress Notes (Signed)
Chaplain visited patient and family. Chaplain prayed with them and will follow up.   06/28/14 1100  Clinical Encounter Type  Visited With Patient and family together  Visit Type Initial;Spiritual support;Social support

## 2014-06-28 NOTE — ED Notes (Signed)
Patient transported to CT 

## 2014-06-28 NOTE — Consult Note (Signed)
Reason for Consult:  Abdominal pain and nausea Referring Physician: Dr. Reece Levy  Sue Benson is an 39 y.o. female.  HPI: Pt presents with nausea and abdominal pain last PM after eating. Last BM was a day ago as best she can remember. Pain in in the upper abdomen above the umbilicus. She describes it as cramping.   She was last here 5/20-22/2015,with the same issue.  She has a history of Cervical cancer and has been treated at [facility] with chemotherapy and radiation therapy.  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  She has chronic lower extremity nerve pain also.   She reports No BM for 24 hours  Work up in the ED show some hypotension, she is afebrile. WBC 11.6 with left shift , CMP is normal, UA is also normal.  CTscan shows:  Distal small bowel inflammation with proximal dilatation, suggesting enteritis with partial proximal obstruction. The overall appearance appears similar to that of prior exam. 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 Small amount of mesenteric free fluid and free fluid in the pelvis is likely reactive, no perforation or abscess.  Similar to last CT on 06/16/14.  She was treated conservative medical management during her last admit.  We are ask to see  Past Medical History  Diagnosis Date  History of cervical cancer, Stage IIB ONCOLOGIST--  [place]   DX 2010--  S/P RADIATION (EXTERNAL AND BRACHY) AND CHEMO SPRING 2010--  NO RECURRENCE     Chronic constipation    SECONDARY TO RADIATION     Radiation cystitis   Chronic pelvic pain   Nerve pain  legs   Recurrent SBO       Frequency of urination   Nocturia   Dyspareunia   Mild acid reflux  History of kidney stones     History of concussion    NO RESIDUAL  Tobacco use 20 years, non since age 30.       Past Surgical History   Procedure Laterality Date  . Cervical cone biopsy  FALL 2009  . Extracorporeal shock wave lithotripsy  SUMMER 2013  . Cysto/ bilateral ureteroscopic stone extractions and stent placement  FALL 2013  . Tonsillectomy   FALL 2010-- LEFT SIDE    2007--- RIGHT SIDE  AND REMOVAL BENIGN CYST  . Cystoscopy with biopsy N/A 02/03/2013    Procedure: CYSTOSCOPY WITH BIOPSY, CAUTERIZATION OF THE BIOPSY SITES INSTILLATION OF PYRIDIUM  AND RECTAL EXAM UNDER ANESTHESIA;  Surgeon: Ailene Rud, MD;  Location: [facility];  Service: Urology;  Laterality: N/A;    Family History  Problem Relation Age of Onset  . Diabetes Mother   . Fibromyalgia Mother   . Myasthenia gravis Father     Social History:  reports that she has quit smoking. Her smoking use included Cigarettes. She has a 10 pack-year smoking history. She has never used smokeless tobacco. She reports that she drinks alcohol. She reports that she does not use illicit drugs.  Allergies:  Allergies  Allergen Reactions  . Penicillins Anaphylaxis  . Prilosec [Omeprazole] Other (See Comments)    Patient states that it "shut her stomach down too much."  . Ceclor [Cefaclor] Rash    Medications:  Prior to Admission:  Prescriptions prior to admission  Medication Sig Dispense Refill Last Dose  . amitriptyline (ELAVIL) 10 MG  tablet Take 1 tablet by mouth daily.   06/27/2014 at Unknown time  . Amphetamine-Dextroamphetamine (ADDERALL PO) Take 10 mg by mouth daily as needed (focus).    06/26/2014 at Unknown time  . ANUCORT-HC 25 MG suppository Place 25 mg rectally as needed. For rectal pain.   Past Month at Unknown time  . clonazePAM (KLONOPIN) 1 MG tablet Take 1 mg by mouth at bedtime.   06/26/2014 at Unknown time  . DULoxetine (CYMBALTA) 30 MG capsule Take 30-60 mg by mouth 2 (two) times daily. Takes 52m in am and 330min pm, updated 5/22   06/27/2014 at Unknown time  . estradiol (VIVELLE-DOT) 0.025 MG/24HR Place 1 patch onto the skin  2 (two) times a week.   06/26/2014 at unknown  . fentaNYL (DURAGESIC - DOSED MCG/HR) 25 MCG/HR patch Place 25 mcg onto the skin every 3 (three) days.   06/27/2014 at Unknown time  . GRALISE 300 MG TABS Take 1 tablet by mouth daily.  2 06/27/2014 at Unknown time  . L-Methylfolate (DEPLIN) 15 MG TABS Take 1 tablet by mouth daily.    06/27/2014 at Unknown time  . Methen-Hyosc-Meth Blue-Na Phos (UROGESIC-BLUE) 81.6 MG TABS Take 1 tablet by mouth 2 (two) times daily. Patient stated 5/22 she takes this med bid, not tid prn   06/27/2014 at Unknown time  . oxyCODONE (OXY IR/ROXICODONE) 5 MG immediate release tablet Take 5 mg by mouth every 6 (six) hours as needed for moderate pain.    06/27/2014 at Unknown time  . polyethylene glycol (MIRALAX / GLYCOLAX) packet Take 17 g by mouth daily.   Past Month at Unknown time  . progesterone (PROMETRIUM) 100 MG capsule Take 100 mg by mouth daily.   06/26/2014 at Unknown time   Scheduled: . sodium chloride  1,000 mL Intravenous Once   Continuous: . sodium chloride 125 mL/hr at 06/28/14 0229  . sodium chloride     PRN: Anti-infectives    None      Results for orders placed or performed during the hospital encounter of 06/28/14 (from the past 48 hour(s))  Urinalysis, [facility] w reflex microscopic [facility]    Status: Abnormal   Collection Time: 06/28/14  2:13 AM  Result Value Ref Range   Color, Urine YELLOW YELLOW   APPearance CLEAR CLEAR   Specific Gravity, Urine 1.012 1.005 - 1.030   pH 8.5 (H) 5.0 - 8.0   Glucose, UA NEGATIVE NEGATIVE mg/dL   Hgb urine dipstick NEGATIVE NEGATIVE   Bilirubin Urine NEGATIVE NEGATIVE   Ketones, ur NEGATIVE NEGATIVE mg/dL   Protein, ur NEGATIVE NEGATIVE mg/dL   Urobilinogen, UA 0.2 0.0 - 1.0 mg/dL   Nitrite NEGATIVE NEGATIVE   Leukocytes, UA NEGATIVE NEGATIVE    Comment: MICROSCOPIC NOT DONE ON URINES WITH NEGATIVE PROTEIN, BLOOD, LEUKOCYTES, NITRITE, OR GLUCOSE <1000 mg/dL.  [facility] urine preg[facility]    Status: None    Collection Time: 06/28/14  2:22 AM  Result Value Ref Range   Preg Test, Ur NEGATIVE NEGATIVE    Comment:        THE SENSITIVITY OF THIS METHODOLOGY IS >24 mIU/mL   CBC with Differential     Status: Abnormal   Collection Time: 06/28/14  2:27 AM  Result Value Ref Range   WBC 11.6 (H) 4.0 - 10.5 K/uL   RBC 4.04 3.87 - 5.11 MIL/uL   Hemoglobin 12.4 12.0 - 15.0 g/dL   HCT 36.3 36.0 - 46.0 %  MCV 89.9 78.0 - 100.0 fL   MCH 30.7 26.0 - 34.0 pg   MCHC 34.2 30.0 - 36.0 g/dL   RDW 13.2 11.5 - 15.5 %   Platelets 358 150 - 400 K/uL   Neutrophils Relative % 79 (H) 43 - 77 %   Neutro Abs 9.0 (H) 1.7 - 7.7 K/uL   Lymphocytes Relative 13 12 - 46 %   Lymphs Abs 1.6 0.7 - 4.0 K/uL   Monocytes Relative 7 3 - 12 %   Monocytes Absolute 0.8 0.1 - 1.0 K/uL   Eosinophils Relative 1 0 - 5 %   Eosinophils Absolute 0.1 0.0 - 0.7 K/uL   Basophils Relative 0 0 - 1 %   Basophils Absolute 0.1 0.0 - 0.1 K/uL  Comprehensive metabolic panel     Status: Abnormal   Collection Time: 06/28/14  2:27 AM  Result Value Ref Range   Sodium 136 135 - 145 mmol/L   Potassium 3.8 3.5 - 5.1 mmol/L   Chloride 102 101 - 111 mmol/L   CO2 25 22 - 32 mmol/L   Glucose, Bld 114 (H) 65 - 99 mg/dL   BUN 18 6 - 20 mg/dL   Creatinine, Ser 0.84 0.44 - 1.00 mg/dL   Calcium 9.5 8.9 - 10.3 mg/dL   Total Protein 7.1 6.5 - 8.1 g/dL   Albumin 4.1 3.5 - 5.0 g/dL   AST 23 15 - 41 U/L   ALT 20 14 - 54 U/L   Alkaline Phosphatase 50 38 - 126 U/L   Total Bilirubin 0.4 0.3 - 1.2 mg/dL   GFR calc non Af Amer >60 >60 mL/min   GFR calc Af Amer >60 >60 mL/min    Comment: (NOTE) The eGFR has been calculated using the CKD EPI equation. This calculation has not been validated in all clinical situations. eGFR's persistently <60 mL/min signify possible Chronic Kidney Disease.    Anion gap 9 5 - 15  Lipase, blood     Status: Abnormal   Collection Time: 06/28/14  2:27 AM  Result Value Ref Range   Lipase 17 (L) 22 - 51 U/L    Ct Abdomen  Pelvis W Contrast  06/28/2014   CLINICAL DATA:  Abdominal pain, cramping and nausea for 1 day. History of cervical cancer post radiation and chemotherapy.  EXAM: CT ABDOMEN AND PELVIS WITH CONTRAST  TECHNIQUE: Multidetector CT imaging of the abdomen and pelvis was performed using the standard protocol following bolus administration of intravenous contrast.  CONTRAST:  142m OMNIPAQUE IOHEXOL 300 MG/ML  SOLN  COMPARISON:  CT 06/15/2013  FINDINGS: The included lung bases are clear.  Subcentimeter hypodensity in the right hepatic dome, unchanged. Focal fatty infiltration adjacent with falciform ligament. The gallbladder is minimally distended. The spleen, pancreas, and adrenal glands are normal. Kidneys demonstrate symmetric enhancement without hydronephrosis or perinephric stranding.  The stomach is physiologically distended with contrast. The proximal small bowel loops are normal. Fluid-filled dilated small bowel loops in the central and right lower pelvis with feculization of small bowel contents. This is just proximal to a moderate length segment of small bowel thickening with adjacent inflammatory change, transition point best appreciated on coronal image 27/73. There is adjacent mesenteric edema and small amount of free fluid. No pneumatosis, perforation or abscess. The appearance and distribution is similar to that of prior exam. There is a moderate volume of stool throughout the colon without colonic wall thickening. The appendix is not definitively identified.  No retroperitoneal adenopathy. Abdominal aorta is  normal in caliber. In stents note a retro aortic left renal vein.  Within the pelvis the urinary bladder is physiologically distended. Uterus is small with fiducial markers in the region of the cervix. Small amount of free fluid in the pelvis. No inguinal or pelvic adenopathy.  There are no acute or suspicious osseous abnormalities.  IMPRESSION: Distal small bowel inflammation with proximal dilatation,  suggesting enteritis with partial proximal obstruction. The overall appearance appears similar to that of prior exam. 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 Small amount of mesenteric free fluid and free fluid in the pelvis is likely reactive, no perforation or abscess.   Electronically Signed   By: Jeb Levering M.D.   On: 06/28/2014 05:28    Review of Systems  Constitutional: Negative.   HENT: Negative.        Complains of chronic dry mouth since treatment.  Eyes: Negative.   Respiratory: Negative.   Cardiovascular: Negative.   Gastrointestinal: Positive for heartburn, nausea, abdominal pain (cramping above the umbilicus) and constipation (chronic issue). Negative for diarrhea.  Genitourinary: Positive for dysuria (on chronic treatment for this).  Musculoskeletal: Negative.   Skin: Negative.   Neurological: Negative.   Endo/Heme/Allergies: Negative.   Psychiatric/Behavioral: Positive for depression. The patient is nervous/anxious.    Blood pressure 102/58, pulse 90, temperature 97.7 F (36.5 C), temperature source Oral, resp. rate 16, height '5\' 3"'  (1.6 m), weight 58.06 kg (128 lb), SpO2 94 %. Physical Exam  Constitutional: She is oriented to person, place, and time. She appears well-developed and well-nourished. No distress.  HENT:  Head: Normocephalic and atraumatic.  Nose: Nose normal.  Eyes: Conjunctivae and EOM are normal. Pupils are equal, round, and reactive to light. Right eye exhibits no discharge. Left eye exhibits no discharge. No scleral icterus.  Neck: Normal range of motion. No JVD present. No tracheal deviation present. No thyromegaly present.  Cardiovascular: Normal rate, regular rhythm, normal heart sounds and intact distal pulses.   No murmur heard. Respiratory: Effort normal and breath sounds normal. No respiratory distress. She has no wheezes. She has no rales. She exhibits no tenderness.  GI: Soft. She exhibits no distension and no mass. There  is no tenderness. There is no rebound and no guarding.  NO Bowel sounds She does not appear in any significant discomfort now.  Musculoskeletal: She exhibits no edema or tenderness.  Lymphadenopathy:    She has no cervical adenopathy.  Neurological: She is alert and oriented to person, place, and time. No cranial nerve deficit.  Skin: Skin is dry. No rash noted. She is not diaphoretic. No erythema. No pallor.  Psychiatric: Her behavior is normal. Judgment and thought content normal.    Assessment/Plan: SBO with recurrent radiation enteritis Hx of radiation cystitis S/p radiation/chemotherapy for cervical stage IIB cancer Chronic pelvic and lower extremity pain Chronic cystitic pain, nocturia and urinary frequency; on medications for this also. Dyspareunia Hx or nephrolithiasis Anxiety and depression  Plan:  Agree with bowel rest, hydration and monitor her closely.  Currently no surgical issues.  I would let [facility] know she is in again so they can follow along.  They did her chemotherapy and radiation therapy.  We will follow with you.    Milia Warth 06/28/2014, 7:29 AM

## 2014-06-28 NOTE — ED Provider Notes (Signed)
TIME SEEN: 1:50 AM  CHIEF COMPLAINT: Abdominal pain, nausea  HPI: Pt is a 39 y.o. female with history of cervical cancer status post radiation and chemotherapy, chronic pelvic pain, prior history of bowel obstruction secondary to radiation who presents to the emergency department with abdominal pain that is diffuse that started today. She's had nausea but no vomiting. Last bowel movement was yesterday. Reports she is not passing gas. Has had chills but no fever. No vaginal bleeding or discharge. No dysuria or hematuria.  ROS: See HPI Constitutional: no fever  Eyes: no drainage  ENT: no runny nose   Cardiovascular:  no chest pain  Resp: no SOB  GI: no vomiting GU: no dysuria Integumentary: no rash  Allergy: no hives  Musculoskeletal: no leg swelling  Neurological: no slurred speech ROS otherwise negative  PAST MEDICAL HISTORY/PAST SURGICAL HISTORY:  Past Medical History  Diagnosis Date  . Frequency of urination   . Nocturia   . Dyspareunia   . History of cervical cancer ONCOLOGIST--  [place]    DX 2010--  S/P RADIATION (EXTERNAL AND BRACHY) AND CHEMO SPRING 2010--  NO RECURRENCE  . Chronic constipation     SECONDARY TO RADIATION  . History of concussion     NO RESIDUAL  . Mild acid reflux   . History of kidney stones   . Wears glasses   . Nerve pain     legs  . Radiation cystitis     MEDICATIONS:  Prior to Admission medications   Medication Sig Start Date End Date Taking? Authorizing Provider  Amphetamine-Dextroamphetamine (ADDERALL PO) Take 10 mg by mouth daily as needed (focus).     Historical Provider, MD  ANUCORT-HC 25 MG suppository Place 25 mg rectally as needed. For rectal pain. 05/05/13   Historical Provider, MD  clonazePAM (KLONOPIN) 1 MG tablet Take 1 mg by mouth at bedtime.    Historical Provider, MD  diazepam (VALIUM) 2 MG tablet Place 2 mg vaginally as needed. As needed for pelvic pain 05/07/13   Historical Provider, MD  DULoxetine (CYMBALTA) 30 MG capsule  Take 30-60 mg by mouth 2 (two) times daily. Takes 60mg  in am and 30mg  in pm, updated 5/22    Historical Provider, MD  estradiol (VIVELLE-DOT) 0.025 MG/24HR Place 1 patch onto the skin 2 (two) times a week.    Historical Provider, MD  gabapentin (NEURONTIN) 300 MG capsule Take 300 mg by mouth 2 (two) times daily. 300 mg in the morning and 600 at bedtime. 05/12/13   Historical Provider, MD  L-Methylfolate (DEPLIN) 15 MG TABS Take by mouth.    Historical Provider, MD  Methen-Hyosc-Meth Blue-Na Phos (UROGESIC-BLUE) 81.6 MG TABS Take 1 tablet by mouth 2 (two) times daily. Patient stated 5/22 she takes this med bid, not tid prn    Historical Provider, MD  oxyCODONE (OXY IR/ROXICODONE) 5 MG immediate release tablet Take 5 mg by mouth every 6 (six) hours as needed. For pain. 06/02/13   Historical Provider, MD  polyethylene glycol (MIRALAX / GLYCOLAX) packet Take 17 g by mouth daily.    Historical Provider, MD  progesterone (PROMETRIUM) 100 MG capsule Take 100 mg by mouth daily.    Historical Provider, MD    ALLERGIES:  Allergies  Allergen Reactions  . Penicillins Anaphylaxis  . Prilosec [Omeprazole] Other (See Comments)    Patient states that it "shut her stomach down too much."  . Ceclor [Cefaclor] Rash    SOCIAL HISTORY:  History  Substance Use Topics  .  Smoking status: Former Smoker -- 0.50 packs/day for 20 years    Types: Cigarettes  . Smokeless tobacco: Never Used     Comment: CURRENTLY USES [employer]  . Alcohol Use: Yes     Comment: OCCASIONAL    FAMILY HISTORY: Family History  Problem Relation Age of Onset  . Diabetes Mother   . Fibromyalgia Mother   . Myasthenia gravis Father     EXAM: BP 117/74 mmHg  Pulse 80  Temp(Src) 97.7 F (36.5 C) (Oral)  Resp 18  Ht 5\' 3"  (1.6 m)  Wt 128 lb (58.06 kg)  BMI 22.68 kg/m2  SpO2 100% CONSTITUTIONAL: Alert and oriented and responds appropriately to questions. Well-appearing; well-nourished HEAD: Normocephalic EYES: Conjunctivae clear,  PERRL ENT: normal nose; no rhinorrhea; moist mucous membranes; pharynx without lesions noted NECK: Supple, no meningismus, no LAD  CARD: RRR; S1 and S2 appreciated; no murmurs, no clicks, no rubs, no gallops RESP: Normal chest excursion without splinting or tachypnea; breath sounds clear and equal bilaterally; no wheezes, no rhonchi, no rales, no hypoxia or respiratory distress, speaking full sentences ABD/GI: Normal bowel sounds; non-distended; soft, tender to palpation throughout the abdomen with voluntary guarding especially in the lower abdomen, no rebound, no peritoneal signs BACK:  The back appears normal and is non-tender to palpation, there is no CVA tenderness EXT: Normal ROM in all joints; non-tender to palpation; no edema; normal capillary refill; no cyanosis, no calf tenderness or swelling    SKIN: Normal color for age and race; warm NEURO: Moves all extremities equally, sensation to light touch intact diffusely, cranial nerves II through XII intact PSYCH: The patient's mood and manner are appropriate. Grooming and personal hygiene are appropriate.  MEDICAL DECISION MAKING: Patient here with abdominal pain that she states feels similar to her prior bowel obstructions. No significant distention on exam but she is tender especially in the lower abdomen with guarding. Nontoxic appearing, afebrile. We'll obtain labs, urine and CT of her abdomen and pelvis. We'll give IV fluids, pain and nausea medicine.   ED PROGRESS: Patient's CT scan shows distal small bowel inflammation with proximal dilatation suggesting enteritis with partial proximal obstruction similar to her CT in 2015 when she had similar symptoms. This is likely secondary to chronic radiation enteritis. During her previous [place] in 2015 she did not require NG tube. Reports symptoms improved with IV hydration and bowel rest. We'll discuss with hospitalist for admission.   5:40 AM  Discussed with Dr. Hal Hope for admission to  med/surg.  He would like surgery consulted.  5:54 AM  D/w Dr. Georgette Dover with general surgery. He states someone will see the patient in consult in the morning.  [place], DO 06/28/14 [id number]

## 2014-06-29 ENCOUNTER — Inpatient Hospital Stay (HOSPITAL_COMMUNITY): Payer: BLUE CROSS/BLUE SHIELD

## 2014-06-29 DIAGNOSIS — I959 Hypotension, unspecified: Secondary | ICD-10-CM

## 2014-06-29 LAB — CBC
HCT: 30.9 % — ABNORMAL LOW (ref 36.0–46.0)
Hemoglobin: 9.9 g/dL — ABNORMAL LOW (ref 12.0–15.0)
MCH: 29.4 pg (ref 26.0–34.0)
MCHC: 32 g/dL (ref 30.0–36.0)
MCV: 91.7 fL (ref 78.0–100.0)
Platelets: 279 10*3/uL (ref 150–400)
RBC: 3.37 MIL/uL — ABNORMAL LOW (ref 3.87–5.11)
RDW: 13.9 % (ref 11.5–15.5)
WBC: 4.7 10*3/uL (ref 4.0–10.5)

## 2014-06-29 LAB — BASIC METABOLIC PANEL
Anion gap: 4 — ABNORMAL LOW (ref 5–15)
BUN: 7 mg/dL (ref 6–20)
CHLORIDE: 111 mmol/L (ref 101–111)
CO2: 26 mmol/L (ref 22–32)
Calcium: 8.3 mg/dL — ABNORMAL LOW (ref 8.9–10.3)
Creatinine, Ser: 0.85 mg/dL (ref 0.44–1.00)
GFR calc non Af Amer: >60 mL/min (ref >60)
GLUCOSE: 112 mg/dL — AB (ref 65–99)
POTASSIUM: 4.1 mmol/L (ref 3.5–5.1)
Sodium: 141 mmol/L (ref 135–145)

## 2014-06-29 LAB — CORTISOL-AM, BLOOD: Cortisol - AM: 1.3 ug/dL — ABNORMAL LOW (ref 6.7–22.6)

## 2014-06-29 MED ORDER — NON FORMULARY: 81.6000 mg/d | Freq: Two times a day (BID) | Status: DC

## 2014-06-29 MED ORDER — PROGESTERONE MICRONIZED 100 MG PO CAPS
100.0000 mg | ORAL_CAPSULE | Freq: Every day | ORAL | Status: DC
  Administered 2014-06-29 – 2014-06-30 (×2): 100 mg via ORAL
  Filled 2014-06-29 (×2): qty 1

## 2014-06-29 MED ORDER — COSYNTROPIN 0.25 MG IJ SOLR
0.2500 mg | Freq: Once | INTRAMUSCULAR | Status: AC
  Administered 2014-06-30: 0.25 mg via INTRAVENOUS
  Filled 2014-06-29 (×2): qty 0.25

## 2014-06-29 MED ORDER — NON FORMULARY: 300.0000 mg/d | Status: DC

## 2014-06-29 MED ORDER — OXYCODONE HCL 5 MG PO TABS
5.0000 mg | ORAL_TABLET | ORAL | Status: DC | PRN
  Administered 2014-06-29 – 2014-06-30 (×2): 5 mg via ORAL
  Filled 2014-06-29 (×2): qty 1

## 2014-06-29 MED ORDER — BOOST / RESOURCE BREEZE PO LIQD: 1.0000 | Freq: Three times a day (TID) | ORAL | Status: DC

## 2014-06-29 MED ORDER — AMPHETAMINE-DEXTROAMPHETAMINE 10 MG PO TABS: 10.0000 mg | ORAL_TABLET | Freq: Every day | ORAL | Status: DC

## 2014-06-29 MED ORDER — URELLE 81 MG PO TABS
1.0000 | ORAL_TABLET | Freq: Two times a day (BID) | ORAL | Status: DC
  Filled 2014-06-29: qty 1

## 2014-06-29 MED ORDER — CLONAZEPAM 1 MG PO TABS
1.0000 mg | ORAL_TABLET | Freq: Every day | ORAL | Status: DC
  Administered 2014-06-29: 1 mg via ORAL
  Filled 2014-06-29: qty 1

## 2014-06-29 MED ORDER — POLYETHYLENE GLYCOL 3350 17 G PO PACK
17.0000 g | PACK | Freq: Every day | ORAL | Status: DC
  Administered 2014-06-29 – 2014-06-30 (×2): 17 g via ORAL
  Filled 2014-06-29 (×2): qty 1

## 2014-06-29 MED ORDER — UROGESIC-BLUE 81.6 MG PO TABS
1.0000 | ORAL_TABLET | Freq: Two times a day (BID) | ORAL | Status: DC
  Administered 2014-06-29 – 2014-06-30 (×2): 81.6 mg via ORAL

## 2014-06-29 MED ORDER — ESTRADIOL 0.05 MG/24HR TD PTWK: 0.0500 mg | MEDICATED_PATCH | TRANSDERMAL | Status: DC

## 2014-06-29 MED ORDER — GABAPENTIN (ONCE-DAILY) 300 MG PO TABS
300.0000 mg | ORAL_TABLET | Freq: Every day | ORAL | Status: DC
  Administered 2014-06-29: 300 mg via ORAL

## 2014-06-29 NOTE — Progress Notes (Signed)
Patient has pillbox of medications in room  And was notified to take box home or give to pharmacy. Patient stated she will have parent take the pillbox home.

## 2014-06-29 NOTE — Progress Notes (Addendum)
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 Margaretmary Bayley at [facility] and Dr. Royal Hawthorn [facility].  - Notified Dr. Thurston Pounds of admission  Mild hypotension and leukocytosis likely related to dehydration from SBO, not responding to IVF and AM cortisol is very low suggesting adrenal insufficiency - continue IVF -  Cortisol stim test in AM  Normocytic anemia likely hemodilutional and chronic disease -  Iron studies, B12, folate -  TSH -  Occult stool -  Repeat hgb in AM  Diet: NPO Access: PIV IVF: yes Proph: lovenox  Code Status: full Family Communication: patient and her mother Disposition Plan: Admit to [facility]   Consultants:  Gen Surg  Procedures:  CT abd/pelvis  Antibiotics:  none   HPI/Subjective:  Feeling a little better.  Having flatus.  Denies BM  Objective: Filed Vitals:   06/28/14 1845 06/28/14 2143 06/29/14 0536 06/29/14 1342  BP: 85/49 85/44 84/49  85/49  Pulse:  60 67 56  Temp:  98 F (36.7 C) 98.2 F (36.8 C) 98.1 F (36.7 C)  TempSrc:  Oral Oral Oral  Resp:  18 18 18   Height:      Weight:      SpO2:  99% 98% 99%    Intake/Output Summary (Last 24 hours) at 06/29/14 1755 Last data filed at 06/29/14 1343  Gross per 24 hour  Intake 1590.83 ml  Output   2300 ml  Net -709.17 ml   Filed Weights   06/28/14 0112  Weight: 58.06 kg (128 lb)     Exam:   General:  Thin F, No acute distress  HEENT:  NCAT, MMM  Cardiovascular:  RRR, nl S1, S2 no mrg, 2+ pulses, warm extremities  Respiratory:  CTAB, no increased WOB  Abdomen:   Hypoactive BS, soft, NT/ND  MSK:   Normal tone and bulk, no LEE  Neuro:  Grossly intact  Data Reviewed: Basic Metabolic Panel:  Recent Labs Lab 06/28/14 0227 06/29/14 0538  NA 136 141  K 3.8 4.1  CL 102 111  CO2 25 26  GLUCOSE 114* 112*  BUN 18 7  CREATININE 0.84 0.85  CALCIUM 9.5 8.3*   Liver Function Tests:  Recent Labs Lab 06/28/14 0227  AST 23  ALT 20  ALKPHOS 50  BILITOT 0.4  PROT 7.1  ALBUMIN 4.1    Recent Labs Lab 06/28/14 0227  LIPASE 17*   No results for input(s): AMMONIA in the last 168 hours. CBC:  Recent Labs Lab 06/28/14 0227 06/29/14 0538  WBC 11.6* 4.7  NEUTROABS 9.0*  --   HGB 12.4 9.9*  HCT 36.3 30.9*  MCV 89.9 91.7  PLT 358 279   Cardiac Enzymes: No results for input(s): CKTOTAL, CKMB, CKMBINDEX, TROPONINI in the last 168 hours. BNP (last 3 results) No results for input(s): BNP in the last 8760 hours.  ProBNP (last 3 results) No results for input(s): PROBNP in the last 8760 hours.  CBG: No results for  input(s): GLUCAP in the last 168 hours.  No results found for this or any previous visit (from the past 240 hour(s)).   Studies: Ct Abdomen Pelvis W Contrast  06/28/2014   CLINICAL DATA:  Abdominal pain, cramping and nausea for 1 day. History of cervical cancer post radiation and chemotherapy.  EXAM: CT ABDOMEN AND PELVIS WITH CONTRAST  TECHNIQUE: Multidetector CT imaging of the abdomen and pelvis was performed using the standard protocol following bolus administration of intravenous contrast.  CONTRAST:  183mL OMNIPAQUE IOHEXOL 300 MG/ML  SOLN  COMPARISON:  CT 06/15/2013  FINDINGS: The included lung bases are clear.  Subcentimeter hypodensity in the right hepatic dome, unchanged. Focal fatty infiltration adjacent with falciform ligament.  The gallbladder is minimally distended. The spleen, pancreas, and adrenal glands are normal. Kidneys demonstrate symmetric enhancement without hydronephrosis or perinephric stranding.  The stomach is physiologically distended with contrast. The proximal small bowel loops are normal. Fluid-filled dilated small bowel loops in the central and right lower pelvis with feculization of small bowel contents. This is just proximal to a moderate length segment of small bowel thickening with adjacent inflammatory change, transition point best appreciated on coronal image 27/73. There is adjacent mesenteric edema and small amount of free fluid. No pneumatosis, perforation or abscess. The appearance and distribution is similar to that of prior exam. There is a moderate volume of stool throughout the colon without colonic wall thickening. The appendix is not definitively identified.  No retroperitoneal adenopathy. Abdominal aorta is normal in caliber. In stents note a retro aortic left renal vein.  Within the pelvis the urinary bladder is physiologically distended. Uterus is small with fiducial markers in the region of the cervix. Small amount of free fluid in the pelvis. No inguinal or pelvic adenopathy.  There are no acute or suspicious osseous abnormalities.  IMPRESSION: Distal small bowel inflammation with proximal dilatation, suggesting enteritis with partial proximal obstruction. The overall appearance appears similar to that of prior exam. 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 Small amount of mesenteric free fluid and free fluid in the pelvis is likely reactive, no perforation or abscess.   Electronically Signed   By: Jeb Levering M.D.   On: 06/28/2014 05:28   Dg Abd 2 Views  06/29/2014   CLINICAL DATA:  Mid abdominal pain with nausea and vomiting  EXAM: ABDOMEN - 2 VIEW  COMPARISON:  CT scan of the abdomen and pelvis of June 28, 2014  FINDINGS: The bowel gas pattern is normal. There is contrast  within the normal calibered colon and rectum. There are no free extraluminal gas collections. There is air and fluid in the stomach. The lung bases are clear. There are surgical clips projecting in the mid pelvis. There is no evidence of free air. No radio-opaque calculi or other significant radiographic abnormality is seen.  IMPRESSION: No acute intra-abdominal abnormality is demonstrated.   Electronically Signed   By: David  Martinique M.D.   On: 06/29/2014 08:57    Scheduled Meds: . amitriptyline  10 mg Oral QHS  . [START ON 06/30/2014] amphetamine-dextroamphetamine  10 mg Oral Q breakfast  . antiseptic oral rinse  7 mL Mouth Rinse BID  . clonazePAM  1 mg Oral QHS  . DULoxetine  30 mg Oral QHS  . DULoxetine  60 mg Oral Daily  . enoxaparin (LOVENOX) injection  40 mg Subcutaneous Q24H  . [START ON 07/06/2014] estradiol  0.05 mg Transdermal Weekly  . famotidine (PEPCID) IV  20 mg Intravenous BID  .  fentaNYL  25 mcg Transdermal Q72H  . Gabapentin (Once-Daily)  300 mg Oral Daily  . ketorolac  30 mg Intravenous 3 times per day  . polyethylene glycol  17 g Oral Daily  . progesterone  100 mg Oral Daily   Continuous Infusions: . dextrose 5 % and 0.45 % NaCl with KCl 20 mEq/L 125 mL/hr at 06/29/14 1506    Active Problems:   SBO (small bowel obstruction)   Partial small bowel obstruction    Time spent: 30 min    Torsha Lemus, [facility]  [facility] Pager [phone]. If 7PM-7AM, please contact night-coverage at [email or web address], password [email or web address] 06/29/2014, 5:55 PM  LOS: 1 day

## 2014-06-29 NOTE — Progress Notes (Signed)
[facility] Progress Note     Subjective: Pt feels much better today, no N/V, or abdominal pain.  Having flatus, no BM yet.  Ambulating well.  Using IS.  Mom at bedside taking notes.  Pt starting to be hungry/thirsty.  Objective: Vital signs in last 24 hours: Temp:  [98 F (36.7 C)-98.2 F (36.8 C)] 98.2 F (36.8 C) (06/03 0536) Pulse Rate:  [60-67] 67 (06/03 0536) Resp:  [16-18] 18 (06/03 0536) BP: (84-85)/(44-50) 84/49 mmHg (06/03 0536) SpO2:  [95 %-99 %] 98 % (06/03 0536)    Intake/Output from previous day: 06/02 0701 - 06/03 0700 In: 2432.5 [P.O.:120; I.V.:2262.5; IV Piggyback:50] Out: 1600 [Urine:1600] Intake/Output this shift:    PE: Gen:  Alert, NAD, pleasant Abd: Soft, NT/ND, +BS, no HSM   Lab Results:   Recent Labs  06/28/14 0227 06/29/14 0538  WBC 11.6* 4.7  HGB 12.4 9.9*  HCT 36.3 30.9*  PLT 358 279   BMET  Recent Labs  06/28/14 0227 06/29/14 0538  NA 136 141  K 3.8 4.1  CL 102 111  CO2 25 26  GLUCOSE 114* 112*  BUN 18 7  CREATININE 0.84 0.85  CALCIUM 9.5 8.3*   PT/INR No results for input(s): LABPROT, INR in the last 72 hours. CMP     Component Value Date/Time   NA 141 06/29/2014 0538   K 4.1 06/29/2014 0538   CL 111 06/29/2014 0538   CO2 26 06/29/2014 0538   GLUCOSE 112* 06/29/2014 0538   BUN 7 06/29/2014 0538   CREATININE 0.85 06/29/2014 0538   CALCIUM 8.3* 06/29/2014 0538   PROT 7.1 06/28/2014 0227   ALBUMIN 4.1 06/28/2014 0227   AST 23 06/28/2014 0227   ALT 20 06/28/2014 0227   ALKPHOS 50 06/28/2014 0227   BILITOT 0.4 06/28/2014 0227   GFRNONAA >60 06/29/2014 0538   GFRAA >60 06/29/2014 0538   Lipase     Component Value Date/Time   LIPASE 17* 06/28/2014 0227       Studies/Results: Ct Abdomen Pelvis W Contrast  06/28/2014   CLINICAL DATA:  Abdominal pain, cramping and nausea for 1 day. History of cervical cancer post radiation and chemotherapy.  EXAM: CT ABDOMEN AND PELVIS WITH CONTRAST  TECHNIQUE:  Multidetector CT imaging of the abdomen and pelvis was performed using the standard protocol following bolus administration of intravenous contrast.  CONTRAST:  149mL OMNIPAQUE IOHEXOL 300 MG/ML  SOLN  COMPARISON:  CT 06/15/2013  FINDINGS: The included lung bases are clear.  Subcentimeter hypodensity in the right hepatic dome, unchanged. Focal fatty infiltration adjacent with falciform ligament. The gallbladder is minimally distended. The spleen, pancreas, and adrenal glands are normal. Kidneys demonstrate symmetric enhancement without hydronephrosis or perinephric stranding.  The stomach is physiologically distended with contrast. The proximal small bowel loops are normal. Fluid-filled dilated small bowel loops in the central and right lower pelvis with feculization of small bowel contents. This is just proximal to a moderate length segment of small bowel thickening with adjacent inflammatory change, transition point best appreciated on coronal image 27/73. There is adjacent mesenteric edema and small amount of free fluid. No pneumatosis, perforation or abscess. The appearance and distribution is similar to that of prior exam. There is a moderate volume of stool throughout the colon without colonic wall thickening. The appendix is not definitively identified.  No retroperitoneal adenopathy. Abdominal aorta is normal in caliber. In stents note a retro aortic left renal vein.  Within the pelvis the urinary bladder is physiologically  distended. Uterus is small with fiducial markers in the region of the cervix. Small amount of free fluid in the pelvis. No inguinal or pelvic adenopathy.  There are no acute or suspicious osseous abnormalities.  IMPRESSION: Distal small bowel inflammation with proximal dilatation, suggesting enteritis with partial proximal obstruction. The overall appearance appears similar to that of prior exam. 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 Small amount of mesenteric free  fluid and free fluid in the pelvis is likely reactive, no perforation or abscess.   Electronically Signed   By: Jeb Levering M.D.   On: 06/28/2014 05:28   Dg Abd 2 Views  06/29/2014   CLINICAL DATA:  Mid abdominal pain with nausea and vomiting  EXAM: ABDOMEN - 2 VIEW  COMPARISON:  CT scan of the abdomen and pelvis of June 28, 2014  FINDINGS: The bowel gas pattern is normal. There is contrast within the normal calibered colon and rectum. There are no free extraluminal gas collections. There is air and fluid in the stomach. The lung bases are clear. There are surgical clips projecting in the mid pelvis. There is no evidence of free air. No radio-opaque calculi or other significant radiographic abnormality is seen.  IMPRESSION: No acute intra-abdominal abnormality is demonstrated.   Electronically Signed   By: David  Martinique M.D.   On: 06/29/2014 08:57    Anti-infectives: Anti-infectives    None       Assessment/Plan SBO with recurrent radiation enteritis -SBO seems improved on xray today, will start clears, IVF, pain control, antiemetics -Ambulate and IS -No urgent surgical indication, would wait for inflammation to subside.  This type of enteritis usually affects large portion of the bowel, and therefore conservative management is preferred as symptoms usually subside over time.   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 Dyspareunia Hx or nephrolithiasis Anxiety and depression DVT - lovenox and SCD's    LOS: 1 day    Nat Christen 06/29/2014, 9:45 AM Pager: [phone]

## 2014-06-30 DIAGNOSIS — D638 Anemia in other chronic diseases classified elsewhere: Secondary | ICD-10-CM

## 2014-06-30 DIAGNOSIS — E2740 Unspecified adrenocortical insufficiency: Secondary | ICD-10-CM

## 2014-06-30 LAB — BASIC METABOLIC PANEL
Anion gap: 5 (ref 5–15)
BUN: 6 mg/dL (ref 6–20)
CO2: 25 mmol/L (ref 22–32)
Calcium: 8.5 mg/dL — ABNORMAL LOW (ref 8.9–10.3)
Chloride: 111 mmol/L (ref 101–111)
Creatinine, Ser: 0.66 mg/dL (ref 0.44–1.00)
GLUCOSE: 100 mg/dL — AB (ref 65–99)
POTASSIUM: 4.2 mmol/L (ref 3.5–5.1)
SODIUM: 141 mmol/L (ref 135–145)

## 2014-06-30 LAB — CBC
HEMATOCRIT: 28.6 % — AB (ref 36.0–46.0)
Hemoglobin: 9.2 g/dL — ABNORMAL LOW (ref 12.0–15.0)
MCH: 29.7 pg (ref 26.0–34.0)
MCHC: 32.2 g/dL (ref 30.0–36.0)
MCV: 92.3 fL (ref 78.0–100.0)
Platelets: 258 10*3/uL (ref 150–400)
RBC: 3.1 MIL/uL — ABNORMAL LOW (ref 3.87–5.11)
RDW: 13.9 % (ref 11.5–15.5)
WBC: 4.3 10*3/uL (ref 4.0–10.5)

## 2014-06-30 LAB — IRON AND TIBC
Iron: 49 ug/dL (ref 28–170)
Saturation Ratios: 22 % (ref 10.4–31.8)
TIBC: 227 ug/dL — AB (ref 250–450)
UIBC: 178 ug/dL

## 2014-06-30 LAB — ACTH STIMULATION, 3 TIME POINTS
CORTISOL BASE: 1.2 ug/dL
Cortisol, 30 Min: 1.2 ug/dL
Cortisol, 60 Min: 1.2 ug/dL

## 2014-06-30 LAB — VITAMIN B12: Vitamin B-12: 417 pg/mL (ref 180–914)

## 2014-06-30 LAB — FOLATE: Folate: 44.4 ng/mL (ref >5.9)

## 2014-06-30 LAB — FERRITIN: FERRITIN: 31 ng/mL (ref 11–307)

## 2014-06-30 LAB — CORTISOL
Cortisol, Plasma: 0.9 ug/dL
Cortisol, Plasma: 18.7 ug/dL

## 2014-06-30 LAB — TSH: TSH: 2.863 u[IU]/mL (ref 0.350–4.500)

## 2014-06-30 MED ORDER — HYDROCORTISONE 10 MG PO TABS
10.0000 mg | ORAL_TABLET | Freq: Every day | ORAL | Status: DC
  Administered 2014-06-30: 10 mg via ORAL
  Filled 2014-06-30: qty 1

## 2014-06-30 MED ORDER — FERROUS SULFATE 325 (65 FE) MG PO TABS
325.0000 mg | ORAL_TABLET | Freq: Two times a day (BID) | ORAL | Status: DC
  Filled 2014-06-30 (×2): qty 1

## 2014-06-30 MED ORDER — HYDROCORTISONE 5 MG PO TABS
15.0000 mg | ORAL_TABLET | Freq: Every day | ORAL | Status: DC
  Filled 2014-06-30: qty 1

## 2014-06-30 MED ORDER — ONDANSETRON HCL 4 MG PO TABS: 4.0000 mg | ORAL_TABLET | Freq: Three times a day (TID) | ORAL | Status: DC | PRN

## 2014-06-30 MED ORDER — FERROUS SULFATE 325 (65 FE) MG PO TABS: 325.0000 mg | ORAL_TABLET | Freq: Every day | ORAL | Status: DC

## 2014-06-30 MED ORDER — HYDROCORTISONE 5 MG PO TABS: ORAL_TABLET | ORAL | Status: DC

## 2014-06-30 NOTE — Discharge Summary (Addendum)
Physician Discharge Summary  Sue Benson PFX:[id number] DOB: [birth year] DOA: 06/28/2014  PCP: Lottie Dawson, MD  Admit date: 06/28/2014 Discharge date: 06/30/2014  Recommendations for Outpatient Follow-up:  1. Follow up with local endocrinologist to verify true adrenal insufficiency 2. Started hydrocortisone supplementation 3. Continue low fiber diet, pureed diet and follow up with General Surgery, Dr. Johney Maine, as needed 4. Started iron supplementation for borderline iron deficiency and progressive anemia for which she should follow up with her primary care doctor in about 3 weeks.  CBC, ferritin  Discharge Diagnoses:  Principal Problem:   Partial small bowel obstruction Active Problems:   Cervix cancer   Chronic radiation cystitis   SBO (small bowel obstruction)   Hypotension   Adrenal insufficiency   Anemia of chronic disease   Discharge Condition: stable, improved  Diet recommendation: pureed   Wt Readings from Last 3 Encounters:  06/28/14 58.06 kg (128 lb)  06/15/13 53.071 kg (117 lb)  03/24/13 50.213 kg (110 lb 11.2 oz)    History of present illness/brief summary:    The patient is a 39 year old female with history of cervical cancer diagnosed in 2010 status post chemotherapy and radiation. 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 She has chronic pain and severe major depressive disorder. She presented with abdominal pain and nausea with decreased flatulence and bowel movements. CT scan demonstrated a distal small bowel obstruction secondary to radiation enteritis.  She was made nothing by mouth and her diet was gradually advanced. She did not require an NG tube. Her pain and nausea had subsided and she has been able to tolerate a full liquid diet prior to discharge.  She had some mild hypotension which was asymptomatic but did not respond to IV fluids. She had a cortisol  level which was low and a stim test, the results of which are pending at the time of discharge. She was started on hydrocortisone empirically. She is on progesterone, however I believe that this should actually allow the adrenal glands to naturally produce cortisol since I believe this is a precursor steroid.  I defer further interpretation to endocrinology since I know steroids usually suppress cortisol and may be interfering with test results.    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  She may continue ibuprofen and fentanyl patch with when necessary oxycodone at discharge. She should follow-up with general surgery as needed and continue a low residue diet.  She was given prescriptions for antiemetics.  Radiation cystitis, once able to tolerate oral medications she resumed her home meds.  Moderate MDD, stable, continued home medications once tolerating by mouth.  Hx of cervical cancer in remission followed by Dr. Margaretmary Bayley at [facility] and Dr. Royal Hawthorn XRT-ONC. Notified Dr. Thurston Pounds of admission  Mild hypotension and leukocytosis likely related to dehydration from SBO, not responding to IVF and AM cortisol was very low suggesting adrenal insufficiency.  She had a cortisol stim test ordered however she did not receive her cosyntropin after her first cortisol level was drawn. He was given later in the morning and she had a cortisol level done 30 minutes after her dose was given and that test is pending at the time of discharge.  As stated above, I am unsure if the progesterone she has been receiving would act as a precursor for cortisol and therefore allow her to produce it naturally if her adrenal  glands are working or if it would cause suppression of cortisol by suppressing ACTH. She is at risk for adrenal insufficiency secondary to her abdominal radiation. I am treating her as if she has adrenal insufficiency with hydrocortisone 50 mg in the morning and 10 mg in the  afternoon and have asked that she followed up with endocrinologist to clarify this question.  Normocytic anemia likely hemodilutional and chronic disease. Her vitamin B12, folate, TSH were within normal limits. Her iron studies were also within normal limits but her ferritin was on the low end of normal at 31. I started her on once daily iron supplementation and have asked her to follow-up with her primary care doctor in about 3-4 weeks for repeat CBC and iron studies.  Sinus bradycardia, asymptomatic.    Consultants:  Gen Surg  Procedures:  CT abd/pelvis  Antibiotics:  none  Discharge Exam: Filed Vitals:   06/30/14 1409  BP: 100/61  Pulse: 54  Temp: 98.9 F (37.2 C)  Resp: 16   Filed Vitals:   06/29/14 1342 06/29/14 2324 06/30/14 0621 06/30/14 1409  BP: 85/49 108/70 95/52 100/61  Pulse: 56 54 56 54  Temp: 98.1 F (36.7 C) 98.1 F (36.7 C) 98 F (36.7 C) 98.9 F (37.2 C)  TempSrc: Oral Oral Oral Oral  Resp: 18 16 16 16   Height:      Weight:      SpO2: 99% 100% 100% 100%     General: Thin F, No acute distress  HEENT: NCAT, MMM  Cardiovascular: RRR, nl S1, S2 no mrg, 2+ pulses, warm extremities  Respiratory: CTAB, no increased WOB  Abdomen: Normal active BS, soft, NT/ND  MSK: Normal tone and bulk, no LEE  Neuro: Grossly intact  Discharge Instructions      Discharge Instructions    Call MD for:  difficulty breathing, headache or visual disturbances    Complete by:  As directed      Call MD for:  extreme fatigue    Complete by:  As directed      Call MD for:  hives    Complete by:  As directed      Call MD for:  persistant dizziness or light-headedness    Complete by:  As directed      Call MD for:  persistant nausea and vomiting    Complete by:  As directed      Call MD for:  severe uncontrolled pain    Complete by:  As directed      Call MD for:  temperature >100.4    Complete by:  As directed      Discharge instructions    Complete  by:  As directed   You were hospitalized with a partial bowel obstruction from inflammation of your bowels from your radiation.  Please eat low fiber foods and use miralax to keep from developing constipation.  Adjust your miralax so that you have about 1-2 soft bowel movements per day.  Please take iron supplements once a day for the next month to boost your iron stores a little which may help your anemia.  Finally, you may have adrenal insufficiency.  I think that the progesterone should not interfere with your test or the hydrocortisone medication, but I am not an expert.  Please follow up with an endocrinologist to verify that you truly have adrenal insufficiency.  Make sure you take your medication every day and take 3 tabs at 6AM and 2 tabs around 3PM to mimic  your body's natural production of cortisol.  If you ever have severe illness or infection or surgery, triple each of your doses to 9 tabs in the morning and 6 tabs in the afternoon for three days, then resume your previous dose.     Increase activity slowly    Complete by:  As directed             Medication List    TAKE these medications        ADDERALL PO  Take 10 mg by mouth daily as needed (focus).     amitriptyline 10 MG tablet  Commonly known as:  ELAVIL  Take 1 tablet by mouth daily.     ANUCORT-HC 25 MG suppository  Generic drug:  hydrocortisone  Place 25 mg rectally as needed. For rectal pain.     clonazePAM 1 MG tablet  Commonly known as:  KLONOPIN  Take 1 mg by mouth at bedtime.     DEPLIN 15 MG Tabs  Take 1 tablet by mouth daily.     DULoxetine 30 MG capsule  Commonly known as:  CYMBALTA  Take 30-60 mg by mouth 2 (two) times daily. Takes 60mg  in am and 30mg  in pm, updated 5/22     estradiol 0.025 MG/24HR  Commonly known as:  VIVELLE-DOT  Place 1 patch onto the skin 2 (two) times a week.     fentaNYL 25 MCG/HR patch  Commonly known as:  DURAGESIC - dosed mcg/hr  Place 25 mcg onto the skin every 3  (three) days.     ferrous sulfate 325 (65 FE) MG tablet  Take 1 tablet (325 mg total) by mouth daily with breakfast.     GRALISE 300 MG Tabs  Generic drug:  Gabapentin (Once-Daily)  Take 1 tablet by mouth daily.     hydrocortisone 5 MG tablet  Commonly known as:  CORTEF  Take 3 tabs in the morning at 6AM and 2 tabs in the afternoon at 3PM     ondansetron 4 MG tablet  Commonly known as:  ZOFRAN  Take 1 tablet (4 mg total) by mouth every 8 (eight) hours as needed for nausea or vomiting.     oxyCODONE 5 MG immediate release tablet  Commonly known as:  Oxy IR/ROXICODONE  Take 5 mg by mouth every 6 (six) hours as needed for moderate pain.     polyethylene glycol packet  Commonly known as:  MIRALAX / GLYCOLAX  Take 17 g by mouth daily.     progesterone 100 MG capsule  Commonly known as:  PROMETRIUM  Take 100 mg by mouth daily.     UROGESIC-BLUE 81.6 MG Tabs  Take 1 tablet by mouth 2 (two) times daily. Patient stated 5/22 she takes this med bid, not tid prn       Follow-up Information    Follow up with Shamleffer, Herschell Dimes, MD. Schedule an appointment as soon as possible for a visit in 1 month.   Specialty:  Internal Medicine   Contact information:   [address]  [address] [place] [address] [phone]        The results of significant diagnostics from this hospitalization (including imaging, microbiology, ancillary and laboratory) are listed below for reference.    Significant Diagnostic Studies: Ct Abdomen Pelvis W Contrast  06/28/2014   CLINICAL DATA:  Abdominal pain, cramping and nausea for 1 day. History of cervical cancer post radiation and chemotherapy.  EXAM: CT ABDOMEN AND PELVIS WITH CONTRAST  TECHNIQUE: Multidetector CT imaging of the abdomen and pelvis was performed using the standard protocol following bolus administration of intravenous contrast.  CONTRAST:  122mL OMNIPAQUE IOHEXOL 300 MG/ML  SOLN  COMPARISON:  CT 06/15/2013  FINDINGS: The  included lung bases are clear.  Subcentimeter hypodensity in the right hepatic dome, unchanged. Focal fatty infiltration adjacent with falciform ligament. The gallbladder is minimally distended. The spleen, pancreas, and adrenal glands are normal. Kidneys demonstrate symmetric enhancement without hydronephrosis or perinephric stranding.  The stomach is physiologically distended with contrast. The proximal small bowel loops are normal. Fluid-filled dilated small bowel loops in the central and right lower pelvis with feculization of small bowel contents. This is just proximal to a moderate length segment of small bowel thickening with adjacent inflammatory change, transition point best appreciated on coronal image 27/73. There is adjacent mesenteric edema and small amount of free fluid. No pneumatosis, perforation or abscess. The appearance and distribution is similar to that of prior exam. There is a moderate volume of stool throughout the colon without colonic wall thickening. The appendix is not definitively identified.  No retroperitoneal adenopathy. Abdominal aorta is normal in caliber. In stents note a retro aortic left renal vein.  Within the pelvis the urinary bladder is physiologically distended. Uterus is small with fiducial markers in the region of the cervix. Small amount of free fluid in the pelvis. No inguinal or pelvic adenopathy.  There are no acute or suspicious osseous abnormalities.  IMPRESSION: Distal small bowel inflammation with proximal dilatation, suggesting enteritis with partial proximal obstruction. The overall appearance appears similar to that of prior exam. 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 Small amount of mesenteric free fluid and free fluid in the pelvis is likely reactive, no perforation or abscess.   Electronically Signed   By: Jeb Levering M.D.   On: 06/28/2014 05:28   Dg Abd 2 Views  06/29/2014   CLINICAL DATA:  Mid abdominal pain with nausea and  vomiting  EXAM: ABDOMEN - 2 VIEW  COMPARISON:  CT scan of the abdomen and pelvis of June 28, 2014  FINDINGS: The bowel gas pattern is normal. There is contrast within the normal calibered colon and rectum. There are no free extraluminal gas collections. There is air and fluid in the stomach. The lung bases are clear. There are surgical clips projecting in the mid pelvis. There is no evidence of free air. No radio-opaque calculi or other significant radiographic abnormality is seen.  IMPRESSION: No acute intra-abdominal abnormality is demonstrated.   Electronically Signed   By: David  Martinique M.D.   On: 06/29/2014 08:57    Microbiology: No results found for this or any previous visit (from the past 240 hour(s)).   Labs: Basic Metabolic Panel:  Recent Labs Lab 06/28/14 0227 06/29/14 0538 06/30/14 0424  NA 136 141 141  K 3.8 4.1 4.2  CL 102 111 111  CO2 25 26 25   GLUCOSE 114* 112* 100*  BUN 18 7 6   CREATININE 0.84 0.85 0.66  CALCIUM 9.5 8.3* 8.5*   Liver Function Tests:  Recent Labs Lab 06/28/14 0227  AST 23  ALT 20  ALKPHOS 50  BILITOT 0.4  PROT 7.1  ALBUMIN 4.1    Recent Labs Lab 06/28/14 0227  LIPASE 17*   No results for input(s): AMMONIA in the last 168 hours. CBC:  Recent Labs Lab 06/28/14 0227 06/29/14 0538 06/30/14 0424  WBC 11.6* 4.7 4.3  NEUTROABS 9.0*  --   --  HGB 12.4 9.9* 9.2*  HCT 36.3 30.9* 28.6*  MCV 89.9 91.7 92.3  PLT 358 279 258   Cardiac Enzymes: No results for input(s): CKTOTAL, CKMB, CKMBINDEX, TROPONINI in the last 168 hours. BNP: BNP (last 3 results) No results for input(s): BNP in the last 8760 hours.  ProBNP (last 3 results) No results for input(s): PROBNP in the last 8760 hours.  CBG: No results for input(s): GLUCAP in the last 168 hours.  Time coordinating discharge: 35 minutes  Signed:  Mercedez Boule  [facility] 06/30/2014, 3:58 PM

## 2014-06-30 NOTE — Progress Notes (Signed)
Pt reported prior to dc "I've used all my home meds that was in pharmacy." Confirmed with pharmacy via phone no medications currently there to be retrieved.

## 2014-06-30 NOTE — Progress Notes (Signed)
Patient ID: Sue Benson, female   DOB: 12/17/1975, 39 y.o.   MRN: [id number]  [facility]  HD#: 3  Subjective: Patient up in bed, mother at bedside.  Tolerating full liquids for breakfast.  BM last night.  Denies pain or nausea.  Objective: Vital signs in last 24 hours: Temp:  [98 F (36.7 C)-98.1 F (36.7 C)] 98 F (36.7 C) (06/04 0621) Pulse Rate:  [54-56] 56 (06/04 0621) Resp:  [16-18] 16 (06/04 0621) BP: (85-108)/(49-70) 95/52 mmHg (06/04 0621) SpO2:  [99 %-100 %] 100 % (06/04 0621)    Intake/Output from previous day: July 04, 2022 0701 - 06/04 0700 In: 2220 [P.O.:120; I.V.:2000; IV Piggyback:100] Out: 1400 [Urine:1400] Intake/Output this shift:    Physical Exam: HEENT - sclerae clear, mucous membranes moist Neck - soft Chest - clear bilaterally Cor - RRR Abdomen - soft without distension; BS present; no tenderness Ext - no edema, non-tender Neuro - alert & oriented, no focal deficits  Lab Results:   Recent Labs  07/04/2014 0538 06/30/14 0424  WBC 4.7 4.3  HGB 9.9* 9.2*  HCT 30.9* 28.6*  PLT 279 258   BMET  Recent Labs  07-04-14 0538 06/30/14 0424  NA 141 141  K 4.1 4.2  CL 111 111  CO2 26 25  GLUCOSE 112* 100*  BUN 7 6  CREATININE 0.85 0.66  CALCIUM 8.3* 8.5*   PT/INR No results for input(s): LABPROT, INR in the last 72 hours. Comprehensive Metabolic Panel:    Component Value Date/Time   NA 141 06/30/2014 0424   NA 141 July 04, 2014 0538   K 4.2 06/30/2014 0424   K 4.1 Jul 04, 2014 0538   CL 111 06/30/2014 0424   CL 111 04-Jul-2014 0538   CO2 25 06/30/2014 0424   CO2 26 07-04-14 0538   BUN 6 06/30/2014 0424   BUN 7 July 04, 2014 0538   CREATININE 0.66 06/30/2014 0424   CREATININE 0.85 07-04-2014 0538   GLUCOSE 100* 06/30/2014 0424   GLUCOSE 112* 07/04/14 0538   CALCIUM 8.5* 06/30/2014 0424   CALCIUM 8.3* 2014/07/04 0538   AST 23 06/28/2014 0227   AST 20 06/15/2013 0010   ALT 20 06/28/2014 0227   ALT 14  06/15/2013 0010   ALKPHOS 50 06/28/2014 0227   ALKPHOS 49 06/15/2013 0010   BILITOT 0.4 06/28/2014 0227   BILITOT 0.4 06/15/2013 0010   PROT 7.1 06/28/2014 0227   PROT 7.5 06/15/2013 0010   ALBUMIN 4.1 06/28/2014 0227   ALBUMIN 4.3 06/15/2013 0010    Studies/Results: Dg Abd 2 Views  04-Jul-2014   CLINICAL DATA:  Mid abdominal pain with nausea and vomiting  EXAM: ABDOMEN - 2 VIEW  COMPARISON:  CT scan of the abdomen and pelvis of June 28, 2014  FINDINGS: The bowel gas pattern is normal. There is contrast within the normal calibered colon and rectum. There are no free extraluminal gas collections. There is air and fluid in the stomach. The lung bases are clear. There are surgical clips projecting in the mid pelvis. There is no evidence of free air. No radio-opaque calculi or other significant radiographic abnormality is seen.  IMPRESSION: No acute intra-abdominal abnormality is demonstrated.   Electronically Signed   By: David  Martinique M.D.   On: 07-04-2014 08:57    Anti-infectives: Anti-infectives    None      Assessment & Plans: SBO with recurrent radiation enteritis  Appears clinically resolved  Tolerating full liquid diet  AXR yesterday without obstruction  Recommend  soft/pureed diet for two weeks  OK for discharge from surgical standpoint - will see prn  Earnstine Regal, MD, [facility] Office: [place] 06/30/2014

## 2014-06-30 NOTE — Progress Notes (Signed)
Assessment unchanged. Pt and parents verbalized understanding of dc instructions through teach back. Pt understands process of obtaining records post discharge per request. Understands to follow up with PCP soon, as well as endocrinologist. Information provided by MD about new dx of adrenal insufficiency. Scripts x 3 given as provided by Dr. Sheran Fava. Discharged via wc to front entrance to meet awaiting vehicle to carry home. Accompanied by parents and NT.

## 2014-09-06 ENCOUNTER — Ambulatory Visit (INDEPENDENT_AMBULATORY_CARE_PROVIDER_SITE_OTHER): Payer: BLUE CROSS/BLUE SHIELD | Admitting: Internal Medicine

## 2014-09-06 ENCOUNTER — Encounter: Payer: Self-pay | Admitting: Internal Medicine

## 2014-09-06 VITALS — BP 110/62 | HR 81 | Temp 97.9°F | Resp 12 | Ht 63.0 in | Wt 140.0 lb

## 2014-09-06 DIAGNOSIS — E2740 Unspecified adrenocortical insufficiency: Secondary | ICD-10-CM | POA: Diagnosis not present

## 2014-09-06 DIAGNOSIS — E041 Nontoxic single thyroid nodule: Secondary | ICD-10-CM | POA: Diagnosis not present

## 2014-09-06 DIAGNOSIS — R7989 Other specified abnormal findings of blood chemistry: Secondary | ICD-10-CM

## 2014-09-06 MED ORDER — HYDROCORTISONE 5 MG PO TABS: ORAL_TABLET | ORAL | Status: DC

## 2014-09-06 NOTE — Patient Instructions (Signed)
Please try to decrease the hydrocortisone (HC) dose as follows: - 15 mg in am and 5 mg in pm for 2 weeks - 10 mg in am and 5 mg in pm   Come back for labs in 1 month: - skip your pm dose of HC the day before and the am of the labs - take the dose right after you have labs drawn  Please come back for a follow-up appointment in 3 months.   If you cannot keep anything down, including your hydrocortisone medication, please go to the emergency room or your primary care physician office to get steroids injected in the muscle or vein.   If you have a fever (more than 100 Fahrenheit) or gastroenteritis with nausea/vomiting and diarrhea, please double the dose of your hydrocortisone for the duration of the fever or the gastroenteritis.  Do not run out of your hydrocortisone medication.

## 2014-09-06 NOTE — Progress Notes (Addendum)
Patient ID: Sue Benson, female   DOB: [birth year], 39 y.o.   MRN: [id number]   HPI  Sue Benson is a 39 y.o.-year-old female, referred by her PCP, Dr.Shamleffer, for evaluation for suspicion for adrenal insufficiency.  Pt. 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   She had Prednisone tapers before  - last 10 years ago.  No h/o Depo-provera, Megace, po ketoconazole, phenytoin, rifampin, chronic fluconazole use. She is on Prometrium, which should not interfere with the pituitary-adrenal axis.  + h/o autoimmune diseases in father (MG). + NSAIDs use. No h/o generalized infections or HIV. No IVDA. + h/o head injuries (concussions - 2 more recently - horseback riding). No severe HA. May have blurry vision  Pt complains of: - + weight loss - + fatigue - + nausea - + vomiting - + abdominal pain - + mm aches - no palpitations - + HAs - no dizziness - no dark skin discoloration  No h/o hyponatremia or hyperkalemia.   Chemistry      Component Value Date/Time   NA 141 06/30/2014 0424   K 4.2 06/30/2014 0424   CL 111 06/30/2014 0424   CO2 25 06/30/2014 0424   BUN 6 06/30/2014 0424   CREATININE 0.66 06/30/2014 0424      Component Value Date/Time   CALCIUM 8.5* 06/30/2014 0424   ALKPHOS 50 06/28/2014 0227   AST 23 06/28/2014 0227   ALT 20 06/28/2014 0227   BILITOT 0.4 06/28/2014  0227     She also tells me that she feels a lump in her lower neck. She would like me to check her thyroid. Of note, previous TSH level 2 months ago was normal: Lab Results  Component Value Date   TSH 2.863 06/30/2014   ROS: Constitutional: + weight gain, + fatigue, + hot flushes, + poor sleep, + nocturia Eyes: + blurry vision, no xerophthalmia ENT: no sore throat, + nodule palpated in throat, no dysphagia/odynophagia, no hoarseness, + hypoacusis Cardiovascular: no CP/+ SOB/no palpitations/leg swelling Respiratory: no cough/+ SOB Gastrointestinal: + N/+ V/+ D/+ C, + heartburn Musculoskeletal: + muscle aches/+ joint aches Skin: + rash, + itching, + easy bruising, + hair loss Neurological: no tremors/numbness/tingling/dizziness, + HA Psychiatric: + depression/+ anxiety + low libido  Past Medical History  Diagnosis Date  . Frequency of urination   . Nocturia   . Dyspareunia   . History of cervical cancer ONCOLOGIST--  [place]    DX 2010--  S/P RADIATION (EXTERNAL AND BRACHY) AND CHEMO SPRING 2010--  NO RECURRENCE  . Chronic constipation     SECONDARY TO RADIATION  . History of concussion     NO RESIDUAL  . Mild acid  reflux   . History of kidney stones   . Wears glasses   . Nerve pain     legs  . Radiation cystitis   . Depression    Past Surgical History  Procedure Laterality Date  . Cervical cone biopsy  FALL 2009  . Extracorporeal shock wave lithotripsy  SUMMER 2013  . Cysto/ bilateral ureteroscopic stone extractions and stent placement  FALL 2013  . Tonsillectomy   FALL 2010-- LEFT SIDE    2007--- RIGHT SIDE  AND REMOVAL BENIGN CYST  . Cystoscopy with biopsy N/A 02/03/2013    Procedure: CYSTOSCOPY WITH BIOPSY, CAUTERIZATION OF THE BIOPSY SITES INSTILLATION OF PYRIDIUM  AND RECTAL EXAM UNDER ANESTHESIA;  Surgeon: Ailene Rud, MD;  Location: [facility];  Service: Urology;  Laterality: N/A;   Social History   Social History  . Marital  Status: Married    Spouse Name: N/A  . Number of Children: 0   Occupational History  . professor   Social History Main Topics  . Smoking status: Former Smoker -- 0.50 packs/day for 20 years    Types: Cigarettes  . Smokeless tobacco: Never Used     Comment: CURRENTLY USES ECIG:  less than a cartridge a day  . Alcohol Use: No  . Drug Use: No   Social History Narrative   Lives witih husband who had a stroke   Current Outpatient Prescriptions on File Prior to Visit  Medication Sig Dispense Refill  . amitriptyline (ELAVIL) 10 MG tablet Take 1 tablet by mouth daily.    . Amphetamine-Dextroamphetamine (ADDERALL PO) Take 10 mg by mouth daily as needed (focus).     . clonazePAM (KLONOPIN) 1 MG tablet Take 1 mg by mouth at bedtime.    . DULoxetine (CYMBALTA) 30 MG capsule Take 30-60 mg by mouth 2 (two) times daily. Takes 60mg  in am and 30mg  in pm, updated 5/22    . estradiol (VIVELLE-DOT) 0.025 MG/24HR Place 1 patch onto the skin 2 (two) times a week.    . GRALISE 300 MG TABS Take 1 tablet by mouth daily.  2  . L-Methylfolate (DEPLIN) 15 MG TABS Take 1 tablet by mouth daily.     . Methen-Hyosc-Meth Blue-Na Phos (UROGESIC-BLUE) 81.6 MG TABS Take 1 tablet by mouth 2 (two) times daily. Patient stated 5/22 she takes this med bid, not tid prn    . ondansetron (ZOFRAN) 4 MG tablet Take 1 tablet (4 mg total) by mouth every 8 (eight) hours as needed for nausea or vomiting. 20 tablet 0  . oxyCODONE (OXY IR/ROXICODONE) 5 MG immediate release tablet Take 5 mg by mouth every 6 (six) hours as needed for moderate pain.     . polyethylene glycol (MIRALAX / GLYCOLAX) packet Take 17 g by mouth daily.    . progesterone (PROMETRIUM) 100 MG capsule Take 100 mg by mouth daily.    . ANUCORT-HC 25 MG suppository Place 25 mg rectally as needed. For rectal pain.    . fentaNYL (DURAGESIC - DOSED MCG/HR) 25 MCG/HR patch Place 25 mcg onto the skin every 3 (three) days.    . ferrous sulfate 325 (65 FE) MG tablet Take 1  tablet (325 mg total) by mouth daily with breakfast. (Patient not taking: Reported on 09/06/2014) 30 tablet 0   No current facility-administered medications on file prior to visit.   Allergies  Allergen Reactions  . Penicillins Anaphylaxis  . Prilosec [Omeprazole] Other (See Comments)    Patient states that it "shut  her stomach down too much."  . Ceclor [Cefaclor] Rash   Family History  Problem Relation Age of Onset  . Diabetes Mother   . Fibromyalgia Mother   . Myasthenia gravis Father    PE: BP 110/62 mmHg  Pulse 81  Temp(Src) 97.9 F (36.6 C) (Oral)  Resp 12  Ht 5\' 3"  (1.6 m)  Wt 140 lb (63.504 kg)  BMI 24.81 kg/m2  SpO2 97% Wt Readings from Last 3 Encounters:  09/06/14 140 lb (63.504 kg)  06/28/14 128 lb (58.06 kg)  06/15/13 117 lb (53.071 kg)   Constitutional: Normal weight, in NAD Eyes: PERRLA, EOMI, no exophthalmos ENT: moist mucous membranes, no thyromegaly, possible left thyroid nodule, no cervical lymphadenopathy Cardiovascular: RRR, No MRG Respiratory: CTA B Gastrointestinal: abdomen soft, NT, ND, BS+ Musculoskeletal: no deformities, strength intact in all 4 Skin: moist, warm, no rashes; no dark discoloration of skin Neurological: no tremor with outstretched hands, DTR normal in all 4  ASSESSMENT: 1. Low cortisol level - ? Acute or chronic adrenal insufficiency  2. ? Thyroid nodule  PLAN:  1. Low cortisol level - Patient had a low cortisol level and subsequent cosyntropin stimulation test while in the hospital with dehydration, hypotension, abdominal pain - complications of radiation enteritis and subsequent small bowel obstruction. At that time, she was given morphine and Dilaudid, which are known to pressors of the pituitary-adrenal axis. There is one cortisol level at 18.5 on 08/30/2014, and I'm not sure how this was obtained (? Repeat stim test). 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 She was managed correctly by starting hydrocortisone with plans for recheck in outpatient. We will try to recheck her cortisol level, after tapering down her current hydrocortisone dose is. - we discussed about proper replacement with Hydrocortisone in case she has AI  - I explained side effects of overreplacement on many organs in the body and the fact that we need to decrease the dose to the minimum dose that allows her to feel well - A physiologic dose would be 10 mg in am and 5 mg in pm, best ~3 pm, so we will first try to decrease her hydrocortisone doses to the above doses, and then we will try to recheck a stim test in a month.   If this is now normal, we can taper the hydrocortisone to off  If this is abnormal, we will need to investigate her pituitary gland, although, she is on chronic narcotics, which may lower the levels... - given sick days rules - see pt instructions - Refilled her hydrocortisone  Patient Instructions  Please try to decrease the hydrocortisone (HC) dose as follows: - 15 mg in am and 5 mg in pm for 2 weeks - 10 mg in am and 5 mg in pm   Come back for labs in 1 month: - skip your pm dose of HC the day before and the am of the labs - take the dose right after you have labs drawn  Please come back for a follow-up appointment in 3 months.   If you cannot keep anything down, including your hydrocortisone medication, please go to the emergency room or your primary care physician office to get steroids injected in the muscle or vein.   If you have a fever (more than 100 Fahrenheit) or gastroenteritis with nausea/vomiting and diarrhea, please double the dose of your hydrocortisone for the duration of  the fever or the gastroenteritis.  Do not run out of your hydrocortisone medication.  2. ? Thyroid nodule - Possible left-sided thyroid nodule, like to obtain a thyroid ultrasound - Reviewed recent TFTs along with the patient:  Normal - will add TPO Abs at next blood draw  CLINICAL DATA: Left lobe nodule  EXAM: THYROID ULTRASOUND  TECHNIQUE: Ultrasound examination of the thyroid gland and adjacent soft tissues was performed.  COMPARISON: None.  FINDINGS: Right thyroid lobe  Measurements: 4.4 x 1.1 x 1.2 cm. 3 mm lower pole hypoechoic nodule.  Left thyroid lobe  Measurements: 3.9 x 1.0 x 1.3 cm. Multiple small nodules are present throughout the lobe. The largest is in the lower pole measures 6 mm and is hypoechoic.  Isthmus  Thickness: 1 mm. No nodules visualized.  Lymphadenopathy  None visualized.  IMPRESSION: Small bilateral nodules as described. The largest is in the left lower pole measuring 6 mm. Findings do not meet current SRU consensus criteria for biopsy. Follow-up by clinical exam is recommended. If patient has known risk factors for thyroid carcinoma, consider follow-up ultrasound in 12 months. If patient is clinically hyperthyroid, consider nuclear medicine thyroid uptake and scan.Reference: Management of Thyroid Nodules Detected at [place]: Society of Radiologists in [place]. Radiology 2005; [id number].  Very small multiple thyroid nodules, not worrisome.   Electronically Signed  By: Marybelle Killings M.D.  On: 09/17/2014 16:47  10/15/2014: Component     Latest Ref Rng 10/11/2014 10/11/2014 10/11/2014        10:02 AM 10:39 AM 11:10 AM  Cortisol, Plasma      5.3 10.5 12.6  C206 ACTH     6 - 50 pg/mL 6    Thyroperoxidase Ab SerPl-aCnc     <9 IU/mL 25 (H)     Persistent central AI. Hashimoto's thyroiditis.

## 2014-09-17 ENCOUNTER — Ambulatory Visit
Admission: RE | Admit: 2014-09-17 | Discharge: 2014-09-17 | Disposition: A | Discharge status: Home / Self Care | Payer: BLUE CROSS/BLUE SHIELD | Source: Ambulatory Visit | Attending: Internal Medicine | Admitting: Internal Medicine

## 2014-09-21 ENCOUNTER — Encounter: Payer: Self-pay | Admitting: *Deleted

## 2014-10-11 ENCOUNTER — Other Ambulatory Visit (INDEPENDENT_AMBULATORY_CARE_PROVIDER_SITE_OTHER): Payer: BLUE CROSS/BLUE SHIELD | Admitting: *Deleted

## 2014-10-11 ENCOUNTER — Other Ambulatory Visit: Payer: BLUE CROSS/BLUE SHIELD

## 2014-10-11 ENCOUNTER — Other Ambulatory Visit (INDEPENDENT_AMBULATORY_CARE_PROVIDER_SITE_OTHER): Payer: BLUE CROSS/BLUE SHIELD

## 2014-10-11 DIAGNOSIS — E041 Nontoxic single thyroid nodule: Secondary | ICD-10-CM

## 2014-10-11 DIAGNOSIS — E2740 Unspecified adrenocortical insufficiency: Secondary | ICD-10-CM

## 2014-10-11 DIAGNOSIS — R7989 Other specified abnormal findings of blood chemistry: Secondary | ICD-10-CM

## 2014-10-11 LAB — CORTISOL
Cortisol, Plasma: 5.3 ug/dL
Cortisol, Plasma: 10.5 ug/dL
Cortisol, Plasma: 12.6 ug/dL

## 2014-10-11 MED ORDER — COSYNTROPIN 0.25 MG IJ SOLR
0.2500 mg | Freq: Once | INTRAMUSCULAR | Status: AC
  Administered 2014-10-11: 0.25 mg via INTRAMUSCULAR

## 2014-10-11 MED ORDER — COSYNTROPIN 0.25 MG IJ SOLR: 0.2500 mg | Freq: Once | INTRAMUSCULAR | Status: DC

## 2014-10-12 LAB — THYROID PEROXIDASE ANTIBODY: Thyroperoxidase Ab SerPl-aCnc: 25 IU/mL — ABNORMAL HIGH (ref <9)

## 2014-10-15 LAB — ACTH: C206 ACTH: 6 pg/mL (ref 6–50)

## 2014-11-13 DIAGNOSIS — Z79891 Long term (current) use of opiate analgesic: Secondary | ICD-10-CM | POA: Insufficient documentation

## 2014-11-19 ENCOUNTER — Ambulatory Visit (INDEPENDENT_AMBULATORY_CARE_PROVIDER_SITE_OTHER): Payer: BLUE CROSS/BLUE SHIELD | Admitting: Internal Medicine

## 2014-11-19 ENCOUNTER — Encounter: Payer: Self-pay | Admitting: Internal Medicine

## 2014-11-19 VITALS — BP 98/60 | HR 98 | Temp 98.0°F | Resp 12 | Wt 139.0 lb

## 2014-11-19 DIAGNOSIS — E063 Autoimmune thyroiditis: Secondary | ICD-10-CM

## 2014-11-19 DIAGNOSIS — E2740 Unspecified adrenocortical insufficiency: Secondary | ICD-10-CM | POA: Diagnosis not present

## 2014-11-19 DIAGNOSIS — E041 Nontoxic single thyroid nodule: Secondary | ICD-10-CM | POA: Diagnosis not present

## 2014-11-19 NOTE — Patient Instructions (Signed)
Please continue hydrocortisone (HC) 10 mg in am and 5 mg in pm   Please stop at the lab.  Please come back for a follow-up appointment in 6 months.    If you cannot keep anything down, including your hydrocortisone medication, please go to the emergency room or your primary care physician office to get steroids injected in the muscle or vein.   If you have a fever (more than 100 Fahrenheit) or gastroenteritis with nausea/vomiting and diarrhea, please double the dose of your hydrocortisone for the duration of the fever or the gastroenteritis.  Do not run out of your hydrocortisone medication.

## 2014-11-19 NOTE — Progress Notes (Addendum)
Patient ID: Sue Benson, female   DOB: [birth year], 39 y.o.   MRN: [id number]   HPI  Sue Benson is a 39 y.o.-year-old female, returning for central adrenal insufficiency and also Hashimoto's thyroiditis and thyroid nodules, both diagnosed at last visit.  Reviewed and addended history: Pt. 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 and 5 mg in p.m.  Since last visit, we repeated a cosyntropin stimulation test >> abnormal, suggesting persistent central adrenal insufficiency. Component     Latest Ref Rng 10/11/2014 10/11/2014 10/11/2014        10:02 AM 10:39 AM 11:10 AM  Cortisol, Plasma      5.3 10.5 12.6  C206 ACTH     6 - 50 pg/mL 6     She had Prednisone tapers before  - last 10 years ago.  No h/o Depo-provera, Megace, po ketoconazole, phenytoin, rifampin, chronic fluconazole use.  She is on Prometrium, which should not interfere with the pituitary-adrenal axis.  + h/o autoimmune diseases in father (MG). + NSAIDs use. No h/o generalized infections or HIV. No IVDA. + h/o head injuries (concussions - 2 more recently - horseback riding). No severe HA.   Pt complains of: - + fatigue - + Feeling hot - No nausea - No vomiting - No abdominal pain - + mm aches - no palpitations - No HAs - no dizziness  No h/o hyponatremia or hyperkalemia.   Chemistry       Component Value Date/Time   NA 141 06/30/2014 0424   K 4.2 06/30/2014 0424   CL 111 06/30/2014 0424   CO2 25 06/30/2014 0424   BUN 6 06/30/2014 0424   CREATININE 0.66 06/30/2014 0424      Component Value Date/Time   CALCIUM 8.5* 06/30/2014 0424   ALKPHOS 50 06/28/2014 0227   AST 23 06/28/2014 0227   ALT 20 06/28/2014 0227   BILITOT 0.4 06/28/2014 0227     At last visit, she also complained about a "lump"in her lower neck. We checked a thyroid ultrasound that showed few thyroid nodules, the largest being 6 mm in the left lobe.  We also checked her TPO antibodies and these were positive >> a new diagnosis of Hashimoto's thyroiditis: Component     Latest Ref Rng 10/11/2014  Thyroperoxidase Ab SerPl-aCnc     <9 IU/mL 25 (H)    Of note, TSH level was normal in June: Lab Results  Component Value Date   TSH 2.863 06/30/2014   ROS: Constitutional: See history of present illness Eyes: No blurry vision, no xerophthalmia ENT: + sore throat, no nodules palpated in throat, no dysphagia/odynophagia, no hoarseness Cardiovascular: no CP/+  SOB/no palpitations/leg swelling Respiratory: no cough/+ SOB Gastrointestinal: No N/V/D/+ C, no heartburn Musculoskeletal: + muscle aches/no joint aches Skin: No rash, + itching, + easy bruising, no hair loss Neurological: no tremors/numbness/tingling/dizziness + Low libido  I reviewed pt's medications, allergies, PMH, social hx, family hx, and changes were documented in the history of present illness. Otherwise, unchanged from my initial visit note. Since last visit, she stopped smoking e-cigarettes. She was on Chantix before. She decreased the dose of clonazepam 0.75 mg daily. She increased the dose of Gralise to 900 mg daily.  Past Medical History  Diagnosis Date  . Frequency of urination   . Nocturia   . Dyspareunia   . History of cervical cancer ONCOLOGIST--  [place]    DX 2010--  S/P RADIATION (EXTERNAL AND BRACHY) AND CHEMO SPRING 2010--   NO RECURRENCE  . Chronic constipation     SECONDARY TO RADIATION  . History of concussion     NO RESIDUAL  . Mild acid reflux   . History of kidney stones   . Wears glasses   . Nerve pain     legs  . Radiation cystitis   . Depression    Past Surgical History  Procedure Laterality Date  . Cervical cone biopsy  FALL 2009  . Extracorporeal shock wave lithotripsy  SUMMER 2013  . Cysto/ bilateral ureteroscopic stone extractions and stent placement  FALL 2013  . Tonsillectomy   FALL 2010-- LEFT SIDE    2007--- RIGHT SIDE  AND REMOVAL BENIGN CYST  . Cystoscopy with biopsy N/A 02/03/2013    Procedure: CYSTOSCOPY WITH BIOPSY, CAUTERIZATION OF THE BIOPSY SITES INSTILLATION OF PYRIDIUM  AND RECTAL EXAM UNDER ANESTHESIA;  Surgeon: Ailene Rud, MD;  Location: [facility];  Service: Urology;  Laterality: N/A;   Social History   Social History  . Marital Status: Married    Spouse Name: N/A  . Number of Children: 0   Occupational History  . professor   Social History Main Topics  . Smoking status: Former Smoker -- 0.50 packs/day for 20 years    Types: Cigarettes  . Smokeless tobacco: Never Used     Comment: CURRENTLY USES ECIG:  less than a cartridge a day  . Alcohol Use: No  . Drug Use: No   Social History Narrative   Lives witih husband who had a stroke   Current Outpatient Prescriptions on File Prior to Visit  Medication Sig Dispense Refill  . amitriptyline (ELAVIL) 10 MG tablet Take 1 tablet by mouth daily.    . Amphetamine-Dextroamphetamine (ADDERALL PO) Take 10 mg by mouth daily as needed (focus).     . ANUCORT-HC 25 MG suppository Place 25 mg rectally as needed. For rectal pain.    . clonazePAM (KLONOPIN) 1 MG tablet Take 1 mg by mouth at bedtime.    Marland Kitchen doxylamine, Sleep, (UNISOM) 25 MG tablet Take 25 mg by mouth at bedtime as needed.    . DULoxetine (CYMBALTA) 30 MG capsule Take 30-60 mg by mouth 2 (two) times daily. Takes 60mg  in am and 30mg  in pm,  updated 5/22    . estradiol (VIVELLE-DOT) 0.025 MG/24HR Place 1 patch onto the skin 2 (two) times a week.    . fentaNYL (DURAGESIC - DOSED MCG/HR) 25 MCG/HR patch Place 25 mcg onto the skin every 3 (three) days.    . fentaNYL (DURAGESIC) 25 MCG/HR patch Apply one patch every 72 hours, remove old patch prior to applying a new patch. Do  not refill prior to: 09/08/2014, 10/08/2014, 11/07/14    . ferrous sulfate 325 (65 FE) MG tablet Take 1 tablet (325 mg total) by mouth daily with breakfast. (Patient not taking: Reported on 09/06/2014) 30 tablet 0  . GRALISE 300 MG TABS Take 1 tablet by mouth daily.  2  . hydrocortisone (CORTEF) 5 MG tablet Take 2 tabs in the morning and 1 tab in the afternoon 120 tablet 11  . ibuprofen (ADVIL,MOTRIN) 200 MG tablet Take 200 mg by mouth every 6 (six) hours as needed.    Marland Kitchen L-Methylfolate (DEPLIN) 15 MG TABS Take 1 tablet by mouth daily.     . Melatonin 300 MCG TABS Take 1 tablet by mouth as needed.    . Methen-Hyosc-Meth Blue-Na Phos (UROGESIC-BLUE) 81.6 MG TABS Take 1 tablet by mouth 2 (two) times daily. Patient stated 5/22 she takes this med bid, not tid prn    . ondansetron (ZOFRAN) 4 MG tablet Take 1 tablet (4 mg total) by mouth every 8 (eight) hours as needed for nausea or vomiting. 20 tablet 0  . oxyCODONE (OXY IR/ROXICODONE) 5 MG immediate release tablet Take 5 mg by mouth every 6 (six) hours as needed for moderate pain.     Marland Kitchen oxymetazoline (AFRIN) 0.05 % nasal spray Place 2 sprays into both nostrils 2 (two) times daily as needed for congestion.    . polyethylene glycol (MIRALAX / GLYCOLAX) packet Take 17 g by mouth daily.    . Probiotic Product (PROBIOTIC ADVANCED PO) Take 2 capsules by mouth daily.    . progesterone (PROMETRIUM) 100 MG capsule Take 100 mg by mouth daily.    . varenicline (CHANTIX STARTING MONTH PAK) 0.5 MG X 11 & 1 MG X 42 tablet TK UTD     No current facility-administered medications on file prior to visit.   Allergies  Allergen Reactions  .  Penicillins Anaphylaxis  . Prilosec [Omeprazole] Other (See Comments)    Patient states that it "shut her stomach down too much."  . Ceclor [Cefaclor] Rash   Family History  Problem Relation Age of Onset  . Diabetes Mother   . Fibromyalgia Mother   . Myasthenia gravis Father    PE: BP 98/60 mmHg  Pulse 98  Temp(Src) 98 F (36.7 C) (Oral)  Resp 12  Wt 139 lb (63.05 kg)  SpO2 96%  LMP  Wt Readings from Last 3 Encounters:  11/19/14 139 lb (63.05 kg)  09/06/14 140 lb (63.504 kg)  06/28/14 128 lb (58.06 kg)   Constitutional: Normal weight, in NAD Eyes: PERRLA, EOMI, no exophthalmos ENT: moist mucous membranes, no thyromegaly, no cervical lymphadenopathy Cardiovascular: RRR, No MRG Respiratory: CTA B Gastrointestinal: abdomen soft, NT, ND, BS+ Musculoskeletal: no deformities, strength intact in all 4 Skin: moist, warm, no rashes Neurological: no tremor with outstretched hands, DTR normal in all 4  ASSESSMENT: 1. Central adrenal insufficiency  2. Thyroid nodules - Largest: 6 mm left lobe  - Thyroid ultrasound (09/17/2014):  Right thyroid lobe: 4.4 x 1.1 x 1.2 cm. 3 mm lower pole hypoechoic nodule.  Left thyroid lobe: 3.9 x 1.0 x 1.3 cm. Multiple small nodules are present throughout the lobe. The largest is in the lower pole measures 6 mm and is hypoechoic.  Isthmus Thickness: 1 mm. No nodules visualized.  Lymphadenopathy None visualized.  3. Hashimoto's thyroiditis - dx in 09/2014  PLAN:  1.Central adrenal insufficiency - Patient had a low cortisol level and subsequent cosyntropin stimulation test while in the hospital with dehydration,  hypotension, abdominal pain - complications of radiation enteritis and subsequent small bowel obstruction. At that time, she was given morphine and Dilaudid, which are known to pressors of the pituitary-adrenal axis. However, in 09/2014, we repeated her cortisol level, and also performed a cosyntropin stimulation test which confirmed  secondary  (central) adrenal insufficiency. This is likely secondary to using opiates, however, we discussed at this visit, that we have to make sure that she does not have a pituitary tumor. Therefore, I will order a pituitary MRI and also the following tests: Orders Placed This Encounter  Procedures  . MR Brain W Wo Contrast  . TSH  . Prolactin  . Luteinizing hormone  . Follicle stimulating hormone  . Insulin-like growth factor  . T4, free  . T3, free  - of note, she is on Prometrium and estradiol patches (hormone replacement therapy), which may influence the results of the [facility] and FSH. - we discussed about proper replacement with Hydrocortisone, the fact that it would be indicated to wear a med alert bracelet mentioning" adrenal insufficiency", and I also stressed the importance of following sick day rules (listed again in the after visit summary)  -for now, she does not have nausea, vomiting, abdominal pain, dizziness, headaches, and it appears that the current dose of hydrocortisone is enough: 10 mg in am and 5 mg in pm  Patient Instructions  Pleasecontinue hydrocortisone (HC) 10 mg in am and 5 mg in pm    If you cannot keep anything down, including your hydrocortisone medication, please go to the emergency room or your primary care physician office to get steroids injected in the muscle or vein.   If you have a fever (more than 100 Fahrenheit) or gastroenteritis with nausea/vomiting and diarrhea, please double the dose of your hydrocortisone for the duration of the fever or the gastroenteritis.  Do not run out of your hydrocortisone medication.  2. Thyroid nodules - Small, not worrisome, per thyroid ultrasound 08/2014 - We discussed that the new thyroid ultrasound is not needed anytime soon  3. Hashimoto thyroiditis  - Diagnosed 09/2014 based on mildly elevated TPO antibodies - Euthyroid, per last TSH from 06/2014 - We discussed about the fact that this is an autoimmune disease,  and it can lead to complete hypothyroidism. However, at this point, with her TSH being 2.8, there is no indication for treatment with thyroid hormones. There are no other FDA approved treatments for Hashimoto's thyroiditis. - She had multiple questions about this which I answered to the best of my ability - Will recheck TFTs today  - time spent with the patient: 40 minutes, of which >50% was spent in obtaining information about her symptoms, reviewing her previous labs, evaluations, and treatments, counseling her about her 3 conditions described above (please see the discussed topics above), and developing a plan to further investigate and treat them; she had a number of questions which I addressed.  Office Visit on 11/19/2014  Component Date Value Ref Range Status  . TSH 11/19/2014 2.60  0.35 - 4.50 uIU/mL Final  . Prolactin 11/19/2014 8.9   Final   Comment:      Reference Ranges:                  Female:                       2.1 -  17.1 ng/ml  Female:   Pregnant          9.7 - 208.5 ng/mL                            Non Pregnant      2.8 -  29.2 ng/mL                            Post Menopausal   1.8 -  20.3 ng/mL                      . [facility] 11/19/2014 39.56   Final   Comment: Female Reference Range:20-70 yrs     1.5-9.3 mIU/mL>70 yrs       3.1-35.6 mIU/mLFemale Reference Range:Follicular Phase     4.4-96.7 mIU/mLMidcycle             8.7-76.3 mIU/mLLuteal Phase         0.5-16.9 mIU/mL  Post Menopausal      15.9-54.0  mIU/mLPregnant             <1.5 mIU/mLContraceptives       0.7-5.6 mIU/mL   . [facility] 11/19/2014 114.0   Final   Female Reference Range:  1.4-18.1 mIU/mLFemale Reference Range:Follicular Phase          2.5-10.2 mIU/mLMidCycle Peak          3.4-33.4 mIU/mLLuteal Phase          1.5-9.1 mIU/mLPost Menopausal     23.0-116.3 mIU/mLPregnant          <0.3 mIU/mL  . IGF-I, LC/MS 11/19/2014 156  52 - 328 ng/mL Final  . Z-Score (Female) 11/19/2014 0.2  -2.0-+2.0 SD Final   Comment:    This test was developed and its analytical performance characteristics have been determined by [facility]. It has not been cleared or approved by FDA. This assay has been validated pursuant to the CLIA regulations and is used for clinical purposes.     . Free T4 11/19/2014 0.72  0.60 - 1.60 ng/dL Final  . T3, Free 11/19/2014 2.7  2.3 - 4.2 pg/mL Final   Pituitary labs are all normal, except LH and FSH, which are in the menopausal range, as expected. Pituitary MRI is pending. I will addend the results when they become available.  CLINICAL DATA: Adrenal insufficiency. Evaluate pituitary axis.  EXAM: MRI HEAD WITHOUT AND WITH CONTRAST  TECHNIQUE: Multiplanar, multiecho pulse sequences of the brain and surrounding structures were obtained without and with intravenous contrast.  CONTRAST: 63mL MULTIHANCE GADOBENATE DIMEGLUMINE 529 MG/ML IV SOLN  COMPARISON: None.  FINDINGS: The brain itself has normal appearance without evidence of old or acute infarction, atrophy, mass lesion, hemorrhage, hydrocephalus or extra-axial collection. Sinuses, middle ears and mastoids are clear.  Pituitary gland is normal in size measuring 1 cm AP, 1.4 cm right to left, and 5.8 mm cephalocaudal. The infundibulum is midline. The hypothalamus appears normal. After contrast administration, the enhancement pattern is normal.  IMPRESSION: Normal appearance of the brain, pituitary gland and hypothalamus.   Electronically Signed By: Nelson Chimes M.D. On: 01/23/2015 09:47  No pituitary tumor or empty sella. At this point, I believe her AI is 2/2 opiates.

## 2014-11-20 LAB — T4, FREE: Free T4: 0.72 ng/dL (ref 0.60–1.60)

## 2014-11-20 LAB — FOLLICLE STIMULATING HORMONE: FSH: 114 m[IU]/mL

## 2014-11-20 LAB — T3, FREE: T3, Free: 2.7 pg/mL (ref 2.3–4.2)

## 2014-11-20 LAB — TSH: TSH: 2.6 u[IU]/mL (ref 0.35–4.50)

## 2014-11-20 LAB — LUTEINIZING HORMONE: LH: 39.56 m[IU]/mL

## 2014-11-21 LAB — PROLACTIN: PROLACTIN: 8.9 ng/mL

## 2014-11-23 LAB — INSULIN-LIKE GROWTH FACTOR
IGF-I, LC/MS: 156 ng/mL (ref 52–328)
Z-Score (Female): 0.2 SD (ref ?–2.0)

## 2014-11-27 DIAGNOSIS — E041 Nontoxic single thyroid nodule: Secondary | ICD-10-CM | POA: Insufficient documentation

## 2014-11-27 DIAGNOSIS — E063 Autoimmune thyroiditis: Secondary | ICD-10-CM | POA: Insufficient documentation

## 2014-11-28 ENCOUNTER — Encounter: Payer: Self-pay | Admitting: *Deleted

## 2014-12-11 ENCOUNTER — Ambulatory Visit: Payer: BLUE CROSS/BLUE SHIELD | Admitting: Internal Medicine

## 2014-12-24 ENCOUNTER — Encounter: Payer: Self-pay | Admitting: Internal Medicine

## 2015-01-08 ENCOUNTER — Inpatient Hospital Stay: Admission: RE | Admit: 2015-01-08 | Payer: BLUE CROSS/BLUE SHIELD | Source: Ambulatory Visit

## 2015-01-22 ENCOUNTER — Ambulatory Visit
Admission: RE | Admit: 2015-01-22 | Discharge: 2015-01-22 | Disposition: A | Discharge status: Home / Self Care | Payer: BLUE CROSS/BLUE SHIELD | Source: Ambulatory Visit | Attending: Internal Medicine | Admitting: Internal Medicine

## 2015-01-22 MED ORDER — GADOBENATE DIMEGLUMINE 529 MG/ML IV SOLN
7.0000 mL | Freq: Once | INTRAVENOUS | Status: AC | PRN
  Administered 2015-01-22: 7 mL via INTRAVENOUS

## 2015-05-20 ENCOUNTER — Ambulatory Visit: Payer: BLUE CROSS/BLUE SHIELD | Admitting: Internal Medicine

## 2015-07-22 ENCOUNTER — Ambulatory Visit (INDEPENDENT_AMBULATORY_CARE_PROVIDER_SITE_OTHER): Payer: BLUE CROSS/BLUE SHIELD | Admitting: Internal Medicine

## 2015-07-22 ENCOUNTER — Encounter: Payer: Self-pay | Admitting: Internal Medicine

## 2015-07-22 VITALS — BP 84/60 | HR 93 | Wt 129.0 lb

## 2015-07-22 DIAGNOSIS — E2740 Unspecified adrenocortical insufficiency: Secondary | ICD-10-CM

## 2015-07-22 DIAGNOSIS — E063 Autoimmune thyroiditis: Secondary | ICD-10-CM

## 2015-07-22 NOTE — Patient Instructions (Addendum)
Please continue the hydrocortisone at the same dose.  Come back for labs Wednesday am.  Skip the pm dose of HC on Tuesday pm and bring with you the am dose for Wednesday and take it right after  after the test.  Please come back in 1 year.

## 2015-07-22 NOTE — Progress Notes (Signed)
Patient ID: Sue Benson, female   DOB: 1975/10/06, 40 y.o.   MRN: [id number]   HPI  Sue Benson is a 40 y.o.-year-old female, returning for central adrenal insufficiency and also Hashimoto's thyroiditis and thyroid nodules. Last visit 8 mo ago.  Reviewed and addended history: Pt. 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 and 5 mg in p.m. She did not have to double the dose. She also did not have to have it injected.  We repeated a cosyntropin stimulation test >> abnormal, suggesting persistent central adrenal insufficiency. Component     Latest Ref Rng 10/11/2014 10/11/2014 10/11/2014        10:02 AM 10:39 AM 11:10 AM  Cortisol, Plasma      5.3 10.5 12.6  C206 ACTH     6 - 50 pg/mL 6     She had Prednisone tapers before  - last 10 years ago.  No h/o Depo-provera, Megace, po ketoconazole, phenytoin, rifampin, chronic fluconazole use.  She is on Prometrium, which should not interfere with the pituitary-adrenal axis.  + h/o autoimmune diseases in father (MG). + NSAIDs use. No h/o generalized infections or HIV. No IVDA. + h/o head injuries (concussions - 2 more recently - horseback riding). No severe HA.   Remaining pituitary labs normal: Component     Latest Ref Rng 11/19/2014  IGF-I, LC/MS     52 - 328 ng/mL 156  Z-Score (Female)     -2.0-+2.0 SD 0.2  Prolactin      8.9   LH      39.56  FSH      114.0   We checked a pituitary MRI (01/23/2015): no pituitary dysfunction or empty sella >> likely her AI is from previous opiate use.  She tells me she sometimes she hyperventilate >> had SOB, has disorientation while driving home.  She complained about a "lump"in her lower neck. We checked a thyroid ultrasound that showed few thyroid nodules, the largest being 6 mm in the left lobe.  We also checked her TPO antibodies and these were positive >> Hashimoto's thyroiditis: Component     Latest Ref Rng 10/11/2014  Thyroperoxidase Ab SerPl-aCnc     <9 IU/mL 25 (H)    Of note, TSH level was normal:  Lab Results  Component Value Date   TSH 2.60 11/19/2014   ROS: Constitutional: no fatigue, weight gain (had weight loss when stopped Amitriptyline), nocturia Eyes: No blurry vision, no xerophthalmia ENT: no sore throat, no nodules palpated in throat, no dysphagia/odynophagia, no hoarseness Cardiovascular: no CP/+ SOB/no palpitations/leg swelling Respiratory: no cough/+ SOB Gastrointestinal: No N/V/D/+ C, no heartburn Musculoskeletal: + muscle aches/no joint aches Skin: No  rash, no hair loss Neurological: + tremors/no numbness/tingling/dizziness + anxiety  I reviewed pt's medications, allergies, PMH, social hx, family hx, and changes were documented in the history of present illness. Otherwise, unchanged from my initial visit note.   Past Medical History  Diagnosis Date  . Frequency of urination   . Nocturia   . Dyspareunia   . History of cervical cancer ONCOLOGIST--  [place]    DX 2010--  S/P RADIATION (EXTERNAL AND BRACHY) AND CHEMO SPRING 2010--  NO RECURRENCE  . Chronic constipation     SECONDARY TO RADIATION  . History of concussion     NO RESIDUAL  . Mild acid reflux   . History of kidney stones   . Wears glasses   . Nerve pain     legs  . Radiation cystitis   . Depression    Past Surgical History  Procedure Laterality Date  . Cervical  cone biopsy  FALL 2009  . Extracorporeal shock wave lithotripsy  SUMMER 2013  . Cysto/ bilateral ureteroscopic stone extractions and stent placement  FALL 2013  . Tonsillectomy   FALL 2010-- LEFT SIDE    2007--- RIGHT SIDE  AND REMOVAL BENIGN CYST  . Cystoscopy with biopsy N/A 02/03/2013    Procedure: CYSTOSCOPY WITH BIOPSY, CAUTERIZATION OF THE BIOPSY SITES INSTILLATION OF PYRIDIUM  AND RECTAL EXAM UNDER ANESTHESIA;  Surgeon: Ailene Rud, MD;  Location: [facility];  Service: Urology;  Laterality: N/A;   Social History   Social History  . Marital Status: Married    Spouse Name: N/A  . Number of Children: 0   Occupational History  . professor   Social History Main Topics  . Smoking status: Former Smoker -- 0.50 packs/day for 20 years    Types: Cigarettes  . Smokeless tobacco: Never Used     Comment: CURRENTLY USES ECIG:  less than a cartridge a day  . Alcohol Use: No  . Drug Use: No   Social History Narrative   Lives witih husband who had a stroke   Current Outpatient Prescriptions on File Prior to Visit  Medication Sig Dispense Refill  . Amphetamine-Dextroamphetamine (ADDERALL PO) Take 10 mg by mouth daily as needed (focus).     . clonazePAM (KLONOPIN) 1 MG tablet Take 0.5 mg by mouth at bedtime.     Marland Kitchen doxylamine, Sleep, (UNISOM) 25 MG tablet Take 25 mg by mouth at bedtime as needed.    . DULoxetine (CYMBALTA) 30 MG capsule Take 20 mg by mouth 5 (five) times daily. Takes 60mg  in am and 30mg  in pm, updated 5/22    . estradiol (VIVELLE-DOT) 0.025 MG/24HR Place 0.05 patches onto the skin 2 (two) times a week.     . fentaNYL (DURAGESIC - DOSED MCG/HR) 25 MCG/HR patch Place 25 mcg onto the skin every 3 (three) days.    . GRALISE 300 MG TABS Take 3 tablets by mouth daily.   2  . hydrocortisone (CORTEF) 5 MG tablet Take 2 tabs in the morning and 1 tab in the afternoon 120 tablet 11  . ibuprofen (ADVIL,MOTRIN) 200 MG tablet Take 200 mg by mouth every 6 (six)  hours as needed.    Marland Kitchen L-Methylfolate (DEPLIN) 15 MG TABS Take 1 tablet by mouth daily.     . Methen-Hyosc-Meth Blue-Na Phos (UROGESIC-BLUE) 81.6 MG TABS Take 1 tablet by mouth 2 (two) times daily. Patient stated 5/22 she takes this med bid, not tid prn    . ondansetron (ZOFRAN) 4  MG tablet Take 1 tablet (4 mg total) by mouth every 8 (eight) hours as needed for nausea or vomiting. 20 tablet 0  . oxyCODONE (OXY IR/ROXICODONE) 5 MG immediate release tablet Take 5 mg by mouth every 6 (six) hours as needed for moderate pain.     . Probiotic Product (PROBIOTIC ADVANCED PO) Take 2 capsules by mouth daily.    . progesterone (PROMETRIUM) 100 MG capsule Take 100 mg by mouth daily.    Marland Kitchen amitriptyline (ELAVIL) 10 MG tablet Take 1 tablet by mouth daily. Reported on 07/22/2015    . ANUCORT-HC 25 MG suppository Place 25 mg rectally as needed. Reported on 07/22/2015    . fentaNYL (DURAGESIC) 25 MCG/HR patch Reported on 07/22/2015    . ferrous sulfate 325 (65 FE) MG tablet Take 1 tablet (325 mg total) by mouth daily with breakfast. (Patient not taking: Reported on 07/22/2015) 30 tablet 0  . Melatonin 300 MCG TABS Take 1 tablet by mouth as needed. Reported on 07/22/2015    . oxymetazoline (AFRIN) 0.05 % nasal spray Place 2 sprays into both nostrils 2 (two) times daily as needed for congestion. Reported on 07/22/2015    . polyethylene glycol (MIRALAX / GLYCOLAX) packet Take 17 g by mouth daily. Reported on 07/22/2015    . varenicline (CHANTIX STARTING MONTH PAK) 0.5 MG X 11 & 1 MG X 42 tablet Reported on 07/22/2015     No current facility-administered medications on file prior to visit.   Allergies  Allergen Reactions  . Penicillins Anaphylaxis  . Prilosec [Omeprazole] Other (See Comments)    Patient states that it "shut her stomach down too much."  . Ceclor [Cefaclor] Rash   Family History  Problem Relation Age of Onset  . Diabetes Mother   . Fibromyalgia Mother   . Myasthenia gravis Father    PE: BP 84/60  mmHg  Pulse 93  Wt 129 lb (58.514 kg)  SpO2 98% Wt Readings from Last 3 Encounters:  07/22/15 129 lb (58.514 kg)  11/19/14 139 lb (63.05 kg)  09/06/14 140 lb (63.504 kg)   Constitutional: Normal weight, in NAD Eyes: PERRLA, EOMI, no exophthalmos ENT: moist mucous membranes, no thyromegaly, no cervical lymphadenopathy Cardiovascular: RRR, No MRG Respiratory: CTA B Gastrointestinal: abdomen soft, NT, ND, BS+ Musculoskeletal: no deformities, strength intact in all 4 Skin: moist, warm, no rashes Neurological: no tremor with outstretched hands, DTR normal in all 4  ASSESSMENT: 1. Central adrenal insufficiency  2. Thyroid nodules - Largest: 6 mm left lobe  - Thyroid ultrasound (09/17/2014):  Right thyroid lobe: 4.4 x 1.1 x 1.2 cm. 3 mm lower pole hypoechoic nodule.  Left thyroid lobe: 3.9 x 1.0 x 1.3 cm. Multiple small nodules are present throughout the lobe. The largest is in the lower pole measures 6 mm and is hypoechoic.  Isthmus Thickness: 1 mm. No nodules visualized.  Lymphadenopathy None visualized.  3. Hashimoto's thyroiditis - dx in 09/2014  PLAN:  1.Central adrenal insufficiency - Patient had a low cortisol level and subsequent cosyntropin stimulation test while in the hospital with dehydration, hypotension, abdominal pain - complications of radiation enteritis and subsequent small bowel obstruction. At that time, she was given morphine and Dilaudid, which are known to pressors of the pituitary-adrenal axis. However, in 09/2014, we repeated her cortisol level, and also performed a cosyntropin stimulation test which confirmed secondary  (central) adrenal insufficiency. This is likely secondary to using opiates, as an MRI checked in 12/2004 was negative for pituitary structural defects.  -  we discussed that I would repeat a stim test 8 mo after the previous to see if the HT-pit-adrenal axis recovered >> she agrees >> ordered - we discussed about proper replacement with  Hydrocortisone, the fact that it would be indicated to wear a med alert bracelet mentioning" adrenal insufficiency" (she has this), and I also stressed the importance of following sick day rules - for now, she does not have nausea, vomiting, abdominal pain, dizziness, headaches. She does have a lower BP, but she is asymptomatic with this. Patient Instructions  Please continue the hydrocortisone at the same dose.  Come back for labs Wednesday am.  Skip the pm dose of HC on Tuesday pm and bring with you the am dose for Wednesday and take it right after  after the test.  Please come back in 1 year.  2. Thyroid nodules - Small, not worrisome, per thyroid ultrasound 08/2014 - no thyroid ultrasound is not needed anytime soon  3. Hashimoto thyroiditis  - Diagnosed 09/2014 based on mildly elevated TPO antibodies - Euthyroid, per last TSH from 10/2014 - Will recheck TFTs today  Component     Latest Ref Rng 07/24/2015 07/24/2015 07/24/2015         8:55 AM  9:29 AM  9:52 AM  TSH     0.35 - 4.50 uIU/mL 4.57 (H)    Cortisol, Plasma      4.2 11.2 14.1  T4,Free(Direct)     0.60 - 1.60 ng/dL 0.69    Triiodothyronine,Free,Serum     2.3 - 4.2 pg/mL 2.3     Stim test is still abnormal. We'll continue the current dose of hydrocortisone. TSH is slightly high. I would like to repeat this in a month and a half, but no intervention needed for now.

## 2015-07-24 ENCOUNTER — Telehealth: Payer: Self-pay

## 2015-07-24 ENCOUNTER — Other Ambulatory Visit (INDEPENDENT_AMBULATORY_CARE_PROVIDER_SITE_OTHER): Payer: BLUE CROSS/BLUE SHIELD

## 2015-07-24 DIAGNOSIS — E063 Autoimmune thyroiditis: Secondary | ICD-10-CM | POA: Diagnosis not present

## 2015-07-24 DIAGNOSIS — E2740 Unspecified adrenocortical insufficiency: Secondary | ICD-10-CM | POA: Diagnosis not present

## 2015-07-24 LAB — CORTISOL
CORTISOL PLASMA: 11.2 ug/dL
CORTISOL PLASMA: 4.2 ug/dL
CORTISOL PLASMA: 14.1 ug/dL

## 2015-07-24 LAB — TSH: TSH: 4.57 u[IU]/mL — AB (ref 0.35–4.50)

## 2015-07-24 LAB — T3, FREE: T3 FREE: 2.3 pg/mL (ref 2.3–4.2)

## 2015-07-24 LAB — T4, FREE: FREE T4: 0.69 ng/dL (ref 0.60–1.60)

## 2015-07-24 MED ORDER — COSYNTROPIN 0.25 MG IJ SOLR
0.2500 mg | Freq: Once | INTRAMUSCULAR | Status: AC
  Administered 2015-07-24: 0.25 mg via INTRAMUSCULAR

## 2015-07-24 NOTE — Telephone Encounter (Signed)
Called patient. Notified of results. Patient acknowledged. Was not able to schedule appointment for labs today, but I will call back tomorrow and she said she could schedule it then.

## 2015-07-25 ENCOUNTER — Telehealth: Payer: Self-pay

## 2015-07-25 NOTE — Telephone Encounter (Signed)
Called patient to set up appointment for lab work. Left message for call back.

## 2015-09-09 ENCOUNTER — Other Ambulatory Visit (INDEPENDENT_AMBULATORY_CARE_PROVIDER_SITE_OTHER): Payer: BLUE CROSS/BLUE SHIELD

## 2015-09-09 DIAGNOSIS — E063 Autoimmune thyroiditis: Secondary | ICD-10-CM | POA: Diagnosis not present

## 2015-09-09 LAB — TSH: TSH: 5.19 u[IU]/mL — AB (ref 0.35–4.50)

## 2015-09-09 LAB — T3, FREE: T3, Free: 2.5 pg/mL (ref 2.3–4.2)

## 2015-09-09 LAB — T4, FREE: FREE T4: 0.66 ng/dL (ref 0.60–1.60)

## 2015-09-10 ENCOUNTER — Telehealth: Payer: Self-pay

## 2015-09-10 DIAGNOSIS — E041 Nontoxic single thyroid nodule: Secondary | ICD-10-CM

## 2015-09-10 MED ORDER — LEVOTHYROXINE SODIUM 25 MCG PO TABS: 25.0000 ug | ORAL_TABLET | Freq: Every day | ORAL | 2 refills | Status: DC

## 2015-09-10 NOTE — Telephone Encounter (Signed)
Spoke with patient about lab results. Advised of medication that was sent into pharmacy, patient stated she would go to pick it up. Patient also scheduled lab appointment in October for labs that were put in today as well. Patient had no other questions or concerns.

## 2015-10-07 ENCOUNTER — Telehealth: Payer: Self-pay | Admitting: Internal Medicine

## 2015-10-07 ENCOUNTER — Other Ambulatory Visit: Payer: Self-pay

## 2015-10-07 MED ORDER — HYDROCORTISONE 5 MG PO TABS: ORAL_TABLET | ORAL | 3 refills | Status: DC

## 2015-10-07 NOTE — Telephone Encounter (Signed)
Pt needs hydrocortisone called to [facility] please [phone]

## 2015-11-08 ENCOUNTER — Other Ambulatory Visit (INDEPENDENT_AMBULATORY_CARE_PROVIDER_SITE_OTHER): Payer: BLUE CROSS/BLUE SHIELD

## 2015-11-08 DIAGNOSIS — E041 Nontoxic single thyroid nodule: Secondary | ICD-10-CM

## 2015-11-08 LAB — T4, FREE: FREE T4: 0.69 ng/dL (ref 0.60–1.60)

## 2015-11-08 LAB — TSH: TSH: 0.96 u[IU]/mL (ref 0.35–4.50)

## 2015-11-11 ENCOUNTER — Telehealth: Payer: Self-pay

## 2015-11-11 NOTE — Telephone Encounter (Signed)
I called and left message, advised of normal lab results, and to continue current dose of levothyroxine.Gave call back number if any issues or questions.

## 2015-12-08 ENCOUNTER — Other Ambulatory Visit: Payer: Self-pay | Admitting: Internal Medicine

## 2016-01-01 ENCOUNTER — Other Ambulatory Visit: Payer: Self-pay | Admitting: Internal Medicine

## 2016-02-06 ENCOUNTER — Other Ambulatory Visit: Payer: Self-pay | Admitting: Internal Medicine

## 2016-02-10 NOTE — Telephone Encounter (Signed)
Please advise. I refilled last month, and advised to make an appointment. No appointment made. Okay to refill? Thank you!

## 2016-02-10 NOTE — Telephone Encounter (Signed)
OK for 2 more months but needs appt.

## 2016-02-11 ENCOUNTER — Other Ambulatory Visit: Payer: Self-pay

## 2016-02-11 MED ORDER — LEVOTHYROXINE SODIUM 25 MCG PO TABS: ORAL_TABLET | ORAL | 1 refills | Status: DC

## 2016-04-03 ENCOUNTER — Other Ambulatory Visit: Payer: Self-pay | Admitting: Internal Medicine

## 2016-05-06 ENCOUNTER — Other Ambulatory Visit: Payer: Self-pay | Admitting: Internal Medicine

## 2016-06-11 ENCOUNTER — Other Ambulatory Visit: Payer: Self-pay | Admitting: Internal Medicine

## 2016-06-11 NOTE — Telephone Encounter (Signed)
Okay to refill last seen 06/2015, no other appointments made after notifying pharmacy she needed an appointment.

## 2016-06-11 NOTE — Telephone Encounter (Signed)
OK 

## 2016-07-03 ENCOUNTER — Other Ambulatory Visit: Payer: Self-pay | Admitting: Internal Medicine

## 2016-07-16 ENCOUNTER — Other Ambulatory Visit: Payer: Self-pay

## 2016-07-16 ENCOUNTER — Telehealth: Payer: Self-pay | Admitting: Internal Medicine

## 2016-07-16 MED ORDER — HYDROCORTISONE 5 MG PO TABS: ORAL_TABLET | ORAL | 0 refills | Status: DC

## 2016-07-16 MED ORDER — LEVOTHYROXINE SODIUM 25 MCG PO TABS: ORAL_TABLET | ORAL | 0 refills | Status: DC

## 2016-07-16 NOTE — Telephone Encounter (Signed)
Submitted for 3 month only, and advised patient to keep appointment.

## 2016-07-16 NOTE — Telephone Encounter (Signed)
See message.

## 2016-07-16 NOTE — Telephone Encounter (Signed)
Patient needs refills on hydrocortisone (CORTEF) 5 MG tablet and levothyroxine (SYNTHROID, LEVOTHROID) 25 MCG tablet however, pharmacy needs her to be seen before refilling.   Dr. Arman Filter earliest follow up is not until September 20th which I scheduled the patient for. Call patient to advise on what can be done about the refills until then, she can not wait that long.

## 2016-10-15 ENCOUNTER — Ambulatory Visit (INDEPENDENT_AMBULATORY_CARE_PROVIDER_SITE_OTHER): Payer: BLUE CROSS/BLUE SHIELD | Admitting: Internal Medicine

## 2016-10-15 ENCOUNTER — Encounter: Payer: Self-pay | Admitting: Internal Medicine

## 2016-10-15 VITALS — BP 100/64 | HR 88 | Ht 63.0 in | Wt 120.0 lb

## 2016-10-15 DIAGNOSIS — E063 Autoimmune thyroiditis: Secondary | ICD-10-CM

## 2016-10-15 DIAGNOSIS — E2740 Unspecified adrenocortical insufficiency: Secondary | ICD-10-CM

## 2016-10-15 DIAGNOSIS — E041 Nontoxic single thyroid nodule: Secondary | ICD-10-CM

## 2016-10-15 NOTE — Progress Notes (Addendum)
Patient ID: Sue Benson, female   DOB: 05-01-1975, 41 y.o.   MRN: [id number]   HPI  Sue Benson is a 41 y.o.-year-old female, returning for central adrenal insufficiency, early menopause and also Hashimoto's thyroiditis and thyroid nodules. Last visit 1 year and 3 mo ago. ObGyn: Dr. 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 She was found to have new precancerous vaginal cells.   She stopped Fentanyl patch in 06/2016 and increased the Oxycodone. Added Lamictal last year (for pain and depression).  She has a lot of stress at work. 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 She travels to [facility] for med appt.  Reviewed and addended history: Pt. 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 She did not have to 2x the dose since last visit or get an iv/im injection. She has a med alert bracelet mentioning adrenal insufficiency.  We repeated a cosyntropin stim test  X2 >> abnormal suggesting persistent central adrenal insufficiency.  Component     Latest Ref Rng 10/11/2014 10/11/2014 10/11/2014        10:02 AM 10:39 AM 11:10 AM  Cortisol, Plasma      5.3 10.5 12.6  C206 ACTH     6 - 50 pg/mL 6      Component     Latest Ref Rng & Units 07/24/2015 07/24/2015 07/24/2015         8:55 AM  9:29 AM  9:52 AM  Cortisol,  Plasma     ug/dL 4.2 11.2 14.1   She had Prednisone tapers before  - last 10 years ago.  No h/o Depo-provera, Megace, po ketoconazole, phenytoin, rifampin, chronic fluconazole use.  She is on Prometrium, which should not interfere with the pituitary-adrenal axis.  + h/o autoimmune diseases in father (MG). + NSAIDs use. No h/o generalized infections or HIV. No IVDA. + h/o head injuries (concussions - 2 more recently - horseback riding). No severe HA.   Pituitary MRI (01/23/2015): no pituitary dysfunction or empty sella >> likely her AI is from previous opiate use. She had another brain MRI in 03/06/2015: normal.  Remaining pituitary labs were normal, Except high LH and FSH, indicating early menopause (she had RxTx and ChTx for cervical cancer in 2010): Component     Latest Ref Rng & Units 11/19/2014  IGF-I, LC/MS     52 - 328 ng/mL 156  Z-Score (Female)     -2.0 - 2.0 SD 0.2  TSH     0.35 - 4.50 uIU/mL 2.60  Prolactin     ng/mL 8.9  LH  mIU/mL 39.56  FSH     mIU/ML 114.0   She is on Estradiol patch (increased from 0.025 to 05 mg since last visit) and Prometrium.  In 2016 >> she c/o "lump" in neck >>. We checked a thyroid ultrasound that showed few thyroid nodules, the largest being 6 mm in left lobe.  We also checked her TPO antibodies and these were positive >> Hashimoto's thyroiditis:  Reviewed TFTs: Lab Results  Component Value Date   TSH 0.96 11/08/2015   TSH 5.19 (H) 09/09/2015   TSH 4.57 (H) 07/24/2015   TSH 2.60 11/19/2014   TSH 2.863 06/30/2014   FREET4 0.69 11/08/2015   FREET4 0.66 09/09/2015   FREET4 0.69 07/24/2015   FREET4 0.72 11/19/2014   T3FREE 2.5 09/09/2015   T3FREE 2.3 07/24/2015   T3FREE 2.7 11/19/2014   Component     Latest Ref Rng & Units 10/11/2014  Thyroperoxidase Ab SerPl-aCnc     <9 IU/mL 25 (H)   After last visit, as TSH remained elevated, we started low-dose levothyroxine, 25 g daily. She felt MUCH better after starting this. She  takes it: - in am - fasting - at least 30 min from b'fast - no Ca, Fe, MVI, PPIs - not on Biotin  She lost weight since last visit and since 2016 but in 2016 she was on Amitriptyline which caused wt gain.  ROS: Constitutional: + weight loss, + fatigue, no subjective hyperthermia, no subjective hypothermia, + nocturia Eyes: no blurry vision, no xerophthalmia ENT: no sore throat, no nodules palpated in throat, no dysphagia, no odynophagia, no hoarseness Cardiovascular: no CP/+ SOB/no palpitations/no leg swelling Respiratory: no cough/+ SOB (anxiety)/no wheezing Gastrointestinal: + N/no V/no D/+ C/+ acid reflux Musculoskeletal: no muscle aches/no joint aches Skin: no rashes, no hair loss Neurological: + tremors/no numbness/no tingling/no dizziness, + HA  I reviewed pt's medications, allergies, PMH, social hx, family hx, and changes were documented in the history of present illness. Otherwise, unchanged from my initial visit note.  Past Medical History:  Diagnosis Date  . Chronic constipation    SECONDARY TO RADIATION  . Depression   . Dyspareunia   . Frequency of urination   . History of cervical cancer ONCOLOGIST--  [place]   DX 2010--  S/P RADIATION (EXTERNAL AND BRACHY) AND CHEMO SPRING 2010--  NO RECURRENCE  . History of concussion    NO RESIDUAL  . History of kidney stones   . Mild acid reflux   . Nerve pain    legs  . Nocturia   . Radiation cystitis   . Wears glasses    Past Surgical History:  Procedure Laterality Date  . CERVICAL CONE BIOPSY  FALL 2009  . CYSTO/ BILATERAL URETEROSCOPIC STONE EXTRACTIONS AND STENT PLACEMENT  FALL 2013  . CYSTOSCOPY WITH BIOPSY N/A 02/03/2013   Procedure: CYSTOSCOPY WITH BIOPSY, CAUTERIZATION OF THE BIOPSY SITES INSTILLATION OF PYRIDIUM  AND RECTAL EXAM UNDER ANESTHESIA;  Surgeon: Ailene Rud, MD;  Location[facility];  Service: Urology;  Laterality: N/A;  . EXTRACORPOREAL SHOCK WAVE LITHOTRIPSY  SUMMER  2013  . TONSILLECTOMY   FALL 2010-- LEFT SIDE   2007--- RIGHT SIDE  AND REMOVAL BENIGN CYST   Social History   Social History  . Marital Status: Married    Spouse Name: N/A  . Number of Children: 0   Occupational History  . professor   Social History Main Topics  . Smoking status: Former Smoker -- 0.50 packs/day for 20 years  Types: Cigarettes  . Smokeless tobacco: Never Used     Comment: CURRENTLY USES ECIG:  less than a cartridge a day  . Alcohol Use: No  . Drug Use: No   Social History Narrative   Lives witih husband who had a stroke   Current Outpatient Prescriptions on File Prior to Visit  Medication Sig Dispense Refill  . amitriptyline (ELAVIL) 10 MG tablet Take 1 tablet by mouth daily. Reported on 07/22/2015    . Amphetamine-Dextroamphetamine (ADDERALL PO) Take 10 mg by mouth daily as needed (focus).     . ANUCORT-HC 25 MG suppository Place 25 mg rectally as needed. Reported on 07/22/2015    . clonazePAM (KLONOPIN) 1 MG tablet Take 0.5 mg by mouth at bedtime.     Marland Kitchen doxylamine, Sleep, (UNISOM) 25 MG tablet Take 25 mg by mouth at bedtime as needed.    . DULoxetine (CYMBALTA) 30 MG capsule Take 20 mg by mouth 5 (five) times daily. Takes 60mg  in am and 30mg  in pm, updated 5/22    . estradiol (VIVELLE-DOT) 0.025 MG/24HR Place 0.05 patches onto the skin 2 (two) times a week.     . fentaNYL (DURAGESIC - DOSED MCG/HR) 25 MCG/HR patch Place 25 mcg onto the skin every 3 (three) days.    . fentaNYL (DURAGESIC) 25 MCG/HR patch Reported on 07/22/2015    . ferrous sulfate 325 (65 FE) MG tablet Take 1 tablet (325 mg total) by mouth daily with breakfast. (Patient not taking: Reported on 07/22/2015) 30 tablet 0  . GRALISE 300 MG TABS Take 3 tablets by mouth daily.   2  . hydrocortisone (CORTEF) 5 MG tablet TAKE 2 TABLET BY MOUTH EVERY MORNING AND 1 TABLET BY MOUTH EVERY AFTERNOON 120 tablet 0  . hydrocortisone (CORTEF) 5 MG tablet TAKE 2 TABLET BY MOUTH EVERY MORNING AND 1 TABLET BY MOUTH  EVERY AFTERNOON 360 tablet 0  . ibuprofen (ADVIL,MOTRIN) 200 MG tablet Take 200 mg by mouth every 6 (six) hours as needed.    Marland Kitchen L-Methylfolate (DEPLIN) 15 MG TABS Take 1 tablet by mouth daily.     Marland Kitchen lamoTRIgine (LAMICTAL) 25 MG tablet Take 75 mg by mouth daily.     Marland Kitchen levothyroxine (SYNTHROID, LEVOTHROID) 25 MCG tablet TAKE 1 TABLET BY MOUTH DAILY BEFORE BREAKFAST. NEED TO MAKE APPT WITH DOCTOR FOR FURTHER REFILLS 30 tablet 0  . levothyroxine (SYNTHROID, LEVOTHROID) 25 MCG tablet TAKE 1 TABLET BY MOUTH DAILY BEFORE BREAKFAST. 90 tablet 0  . Melatonin 300 MCG TABS Take 1 tablet by mouth as needed. Reported on 07/22/2015    . Methen-Hyosc-Meth Blue-Na Phos (UROGESIC-BLUE) 81.6 MG TABS Take 1 tablet by mouth 2 (two) times daily. Patient stated 5/22 she takes this med bid, not tid prn    . ondansetron (ZOFRAN) 4 MG tablet Take 1 tablet (4 mg total) by mouth every 8 (eight) hours as needed for nausea or vomiting. 20 tablet 0  . oxyCODONE (OXY IR/ROXICODONE) 5 MG immediate release tablet Take 5 mg by mouth every 6 (six) hours as needed for moderate pain.     Marland Kitchen oxymetazoline (AFRIN) 0.05 % nasal spray Place 2 sprays into both nostrils 2 (two) times daily as needed for congestion. Reported on 07/22/2015    . polyethylene glycol (MIRALAX / GLYCOLAX) packet Take 17 g by mouth daily. Reported on 07/22/2015    . Probiotic Product (PROBIOTIC ADVANCED PO) Take 2 capsules by mouth daily.    . progesterone (PROMETRIUM) 100 MG capsule Take 100 mg by  mouth daily.    . varenicline (CHANTIX STARTING MONTH PAK) 0.5 MG X 11 & 1 MG X 42 tablet Reported on 07/22/2015     No current facility-administered medications on file prior to visit.    Allergies  Allergen Reactions  . Penicillins Anaphylaxis  . Prilosec [Omeprazole] Other (See Comments)    Patient states that it "shut her stomach down too much."  . Ceclor [Cefaclor] Rash   Family History  Problem Relation Age of Onset  . Diabetes Mother   . Fibromyalgia Mother    . Myasthenia gravis Father    PE: BP 100/64   Pulse 88   Ht 5\' 3"  (1.6 m)   Wt 120 lb (54.4 kg)   SpO2 98%   BMI 21.26 kg/m  Wt Readings from Last 3 Encounters:  10/15/16 120 lb (54.4 kg)  07/22/15 129 lb (58.5 kg)  11/19/14 139 lb (63 kg)   Constitutional: normal weight, in NAD Eyes: PERRLA, EOMI, no exophthalmos ENT: moist mucous membranes, no thyromegaly, no cervical lymphadenopathy Cardiovascular: RRR, No MRG Respiratory: CTA B Gastrointestinal: abdomen soft, NT, ND, BS+ Musculoskeletal: no deformities, strength intact in all 4 Skin: moist, warm, no rashes Neurological: + tremor with outstretched hands, DTR normal in all 4  ASSESSMENT: 1. Central adrenal insufficiency  2. Thyroid nodules - Largest: 6 mm left lobe  - Thyroid ultrasound (09/17/2014):  Right thyroid lobe: 4.4 x 1.1 x 1.2 cm. 3 mm lower pole hypoechoic nodule.  Left thyroid lobe: 3.9 x 1.0 x 1.3 cm. Multiple small nodules are present throughout the lobe. The largest is in the lower pole measures 6 mm and is hypoechoic.  Isthmus Thickness: 1 mm. No nodules visualized.  Lymphadenopathy None visualized.  3. Hashimoto's thyroiditis - dx in 09/2014  4. Early menopause - likely 2/2 RxTx and ChTx for cervical cancer in 2010  PLAN:  1.Central adrenal insufficiency - Patient had a low cortisol level and the subsequent cosyntropin stimulation test was abnormal while in the hospital with dehydration, hypotension, abdominal pain in 2016. These were complications of radiation enteritis and subsequent small bowel obstruction. At that time, she was given morphine and Dilaudid, which are known to pressors of the pituitary-adrenal axis, however, a repeated cortisol level after discharge was still abnormal and a new cosyntropin stimulation test confirmed central adrenal insufficiency. This is likely secondary to using opiates, as brain MRIs were negative for pituitary structural defects.  - We discussed that the  chances of her pituitary-adrenal axis to recover decreased every year. However, I would like to recheck another stimulation test now, and I advised her to skip the hydrocortisone dose the day of the test and the afternoon before the test. - We again discussed about proper replacement replacement with Hydrocortisone, and again emphasized sick day rules. - For now, will continue the hydrocortisone at the same dose, 10 mg in a.m. and 5 mg in p.m. Patient Instructions  Please continue Hydrocortisone 10 mg in a.m. and 5 mg in p.m.  Continue Levothyroxine 25 mcg daily.  .Take the thyroid hormone every day, with water, at least 30 minutes before breakfast, separated by at least 4 hours from: - acid reflux medications - calcium - iron - multivitamins  Please come back for labs at 8 AM. Plan to be here for 1 hour.   Skip the pm dose of HC on the pm before the test and bring with you the am dose to take it right after  after the test.  Also, skip  the pain medication in the am of the test and take it as early as possible the day before.  Please come back in 1 year.  2. Thyroid nodules - small, not worrisome, per U/S from 08/2014 - will plan to repeat the u/s if she develops neck compression sxs  3. Hashimoto thyroiditis  - dx'ed in 2016, based on mildly elevated TPO abs - At last visit, TSH was slightly high and this persisted in being above the upper limit of normal so we started levothyroxine 25 g daily. On this dose, her TFTs normalized.  - latest thyroid labs reviewed with pt >> normal (10/2015). Discussed about TSH targets.  - pt feels good on this dose, but wonders if this can be increased >> will see when results are back - we discussed about taking the thyroid hormone every day, with water, >30 minutes before breakfast, separated by >4 hours from acid reflux medications, calcium, iron, multivitamins. Pt. is taking it correctly - will check thyroid tests at next lab draw: TSH and fT4 -  If labs are abnormal, she will need to return for repeat TFTs in 1.5 months - OTW, RTC in 1 year   4. Early menopause - on HRT >> managed by ObGyn  Orders Placed This Encounter  Procedures  . TSH  . T4, free  . Cortisol  . Cortisol  . Cortisol   I will addend the results when they become available.  - time spent with the patient: 45 min, of which >50% was spent in obtaining information about her symptoms, reviewing her previous labs, evaluations, and treatments, counseling her about her endocrine conditions (please see the discussed topics above), and developing a plan to further investigate and treat them; she had a number of questions which I addressed.  Component     Latest Ref Rng & Units 10/29/2016 10/29/2016 10/29/2016         9:25 AM 10:05 AM 10:44 AM  TSH     0.35 - 4.50 uIU/mL 2.04    Cortisol, Plasma     ug/dL 18.4 22.7 27.4  T4,Free(Direct)     0.60 - 1.60 ng/dL 0.85     Normal stimulation test >> will try to stop HC but keep it at hand. TFTs normal.  Philemon Kingdom, MD PhD [facility] Endocrinology

## 2016-10-15 NOTE — Patient Instructions (Addendum)
Please continue Hydrocortisone 10 mg in a.m. and 5 mg in p.m.  Continue Levothyroxine 25 mcg daily.  .Take the thyroid hormone every day, with water, at least 30 minutes before breakfast, separated by at least 4 hours from: - acid reflux medications - calcium - iron - multivitamins  Please come back for labs at 8 AM. Plan to be here for 1 hour.   Skip the pm dose of HC on the pm before the test and bring with you the am dose to take it right after  after the test.  Also, skip the pain medication in the am of the test and take it as early as possible the day before.  Please come back in 1 year.

## 2016-10-15 NOTE — Addendum Note (Signed)
Addended by: Nile Riggs on: 10/15/2016 05:13 PM   Modules accepted: Orders

## 2016-10-29 ENCOUNTER — Other Ambulatory Visit (INDEPENDENT_AMBULATORY_CARE_PROVIDER_SITE_OTHER): Payer: BLUE CROSS/BLUE SHIELD

## 2016-10-29 DIAGNOSIS — E063 Autoimmune thyroiditis: Secondary | ICD-10-CM

## 2016-10-29 DIAGNOSIS — E2740 Unspecified adrenocortical insufficiency: Secondary | ICD-10-CM | POA: Diagnosis not present

## 2016-10-29 LAB — CORTISOL
Cortisol, Plasma: 18.4 ug/dL
Cortisol, Plasma: 22.7 ug/dL
Cortisol, Plasma: 27.4 ug/dL

## 2016-10-29 LAB — TSH: TSH: 2.04 u[IU]/mL (ref 0.35–4.50)

## 2016-10-29 LAB — T4, FREE: Free T4: 0.85 ng/dL (ref 0.60–1.60)

## 2016-10-30 ENCOUNTER — Telehealth: Payer: Self-pay

## 2016-10-30 NOTE — Telephone Encounter (Signed)
Called patient and gave lab results. Patient had no questions or concerns.  

## 2016-10-30 NOTE — Telephone Encounter (Signed)
-----   Message from Philemon Kingdom, MD sent at 10/29/2016  4:24 PM EDT ----- Almyra Free, can you please call pt: TFTs normal. Normal cosyntropin stimulation test >> we can try to stop hydrocortisone but keep it at hand. I would start by just taking 10 mg in a.m. for the next 5 days, then 5 mg in a.m. for the next 5 days, then stop. We may need to restart it if she starts to become dehydrated, has increase in abdominal pain and significant fatigue. Please let us know if this happens.

## 2016-11-05 ENCOUNTER — Other Ambulatory Visit: Payer: Self-pay | Admitting: Urology

## 2016-11-10 ENCOUNTER — Other Ambulatory Visit: Payer: Self-pay | Admitting: Internal Medicine

## 2016-12-24 IMAGING — CT CT ABD-PELV W/ CM
2 of 4 series · 15 of 46 positions shown, 17 images · IV contrast (100 ML OMNI 300)
Comparison: CT 06/15/2013

CLINICAL DATA: Abdominal pain, cramping and nausea for 1 day.
History of cervical cancer post radiation and chemotherapy.

EXAM:
CT ABDOMEN AND PELVIS WITH CONTRAST
TECHNIQUE: Multidetector CT imaging of the abdomen and pelvis was performed
using the standard protocol following bolus administration of
intravenous contrast.
CONTRAST:  100mL OMNIPAQUE IOHEXOL 300 MG/ML  SOLN

[Series 2: abd/pel with · axial · 0.62mm/px · z∈[-46,+299]mm · 12 of 81 slices shown, 14 images]
[im 6/81  soft-tissue]
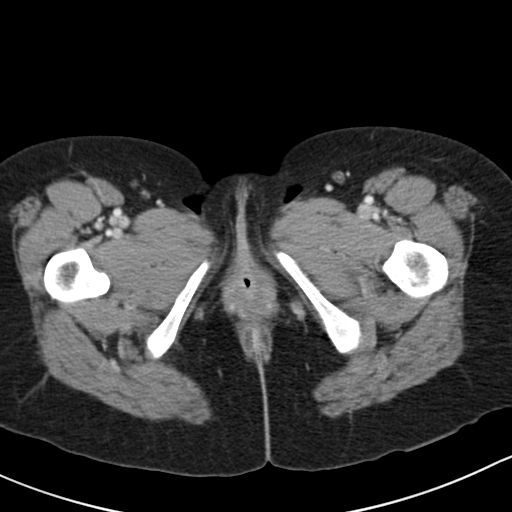
[im 6/81  bone]
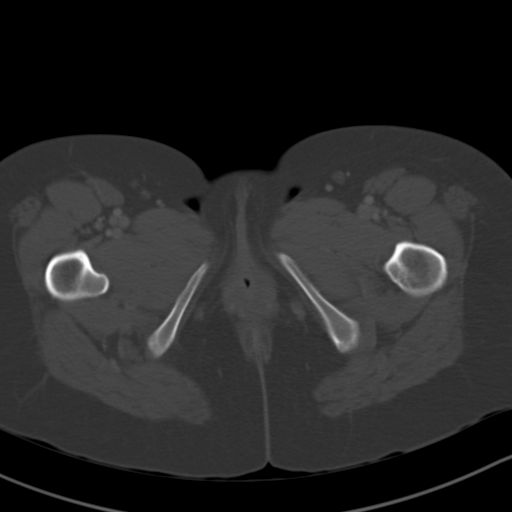
[im 12/81  soft-tissue]
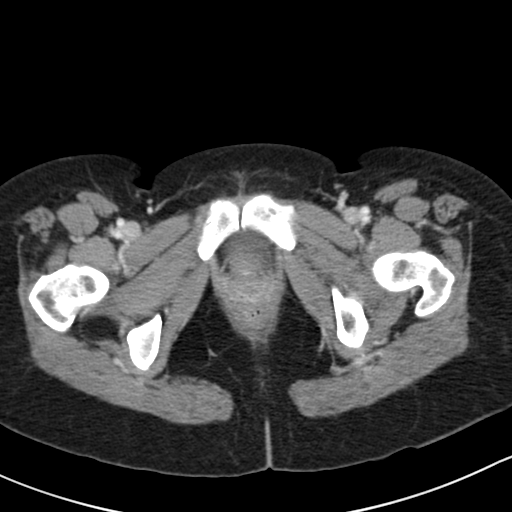
[im 18/81  soft-tissue]
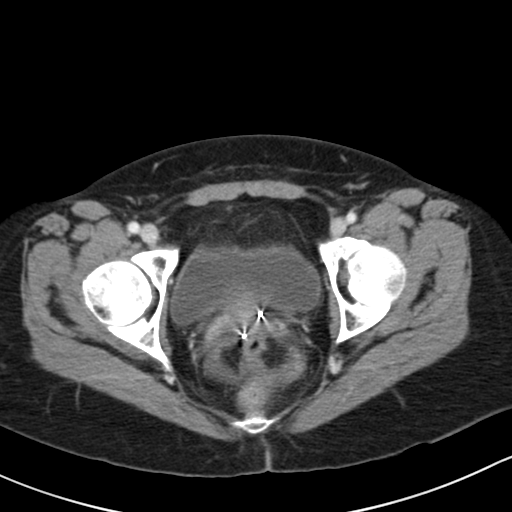
[im 24/81  soft-tissue]
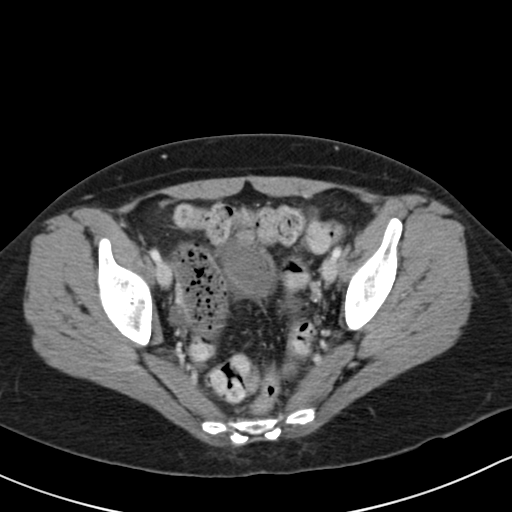
[im 30/81  soft-tissue]
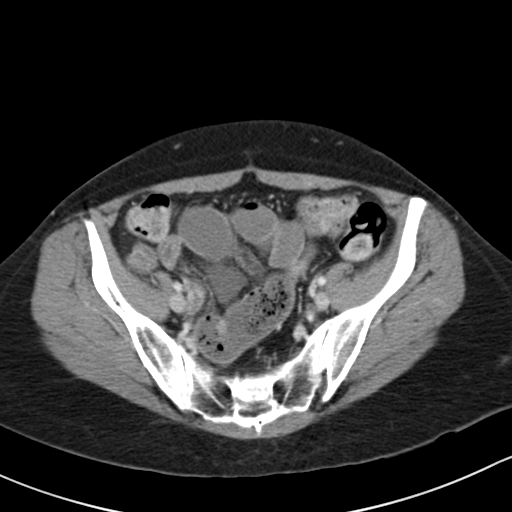
[im 36/81  soft-tissue]
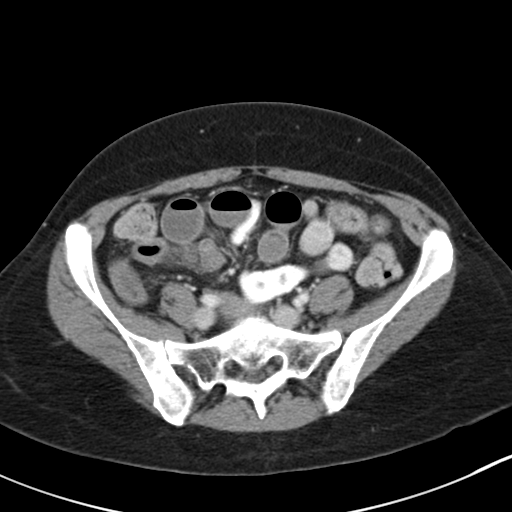
[im 45/81  soft-tissue]
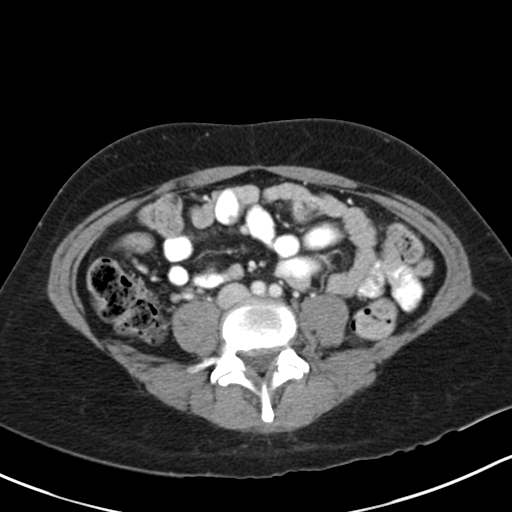
[im 51/81  soft-tissue]
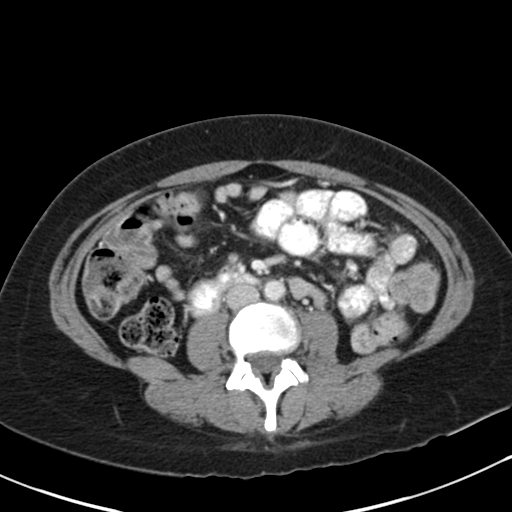
[im 57/81  soft-tissue]
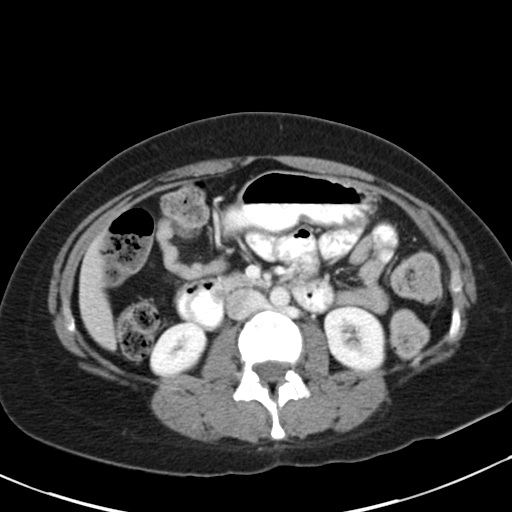
[im 57/81  bone]
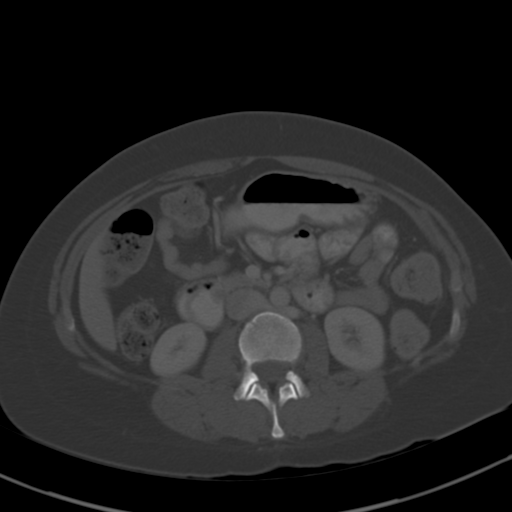
[im 63/81  soft-tissue]
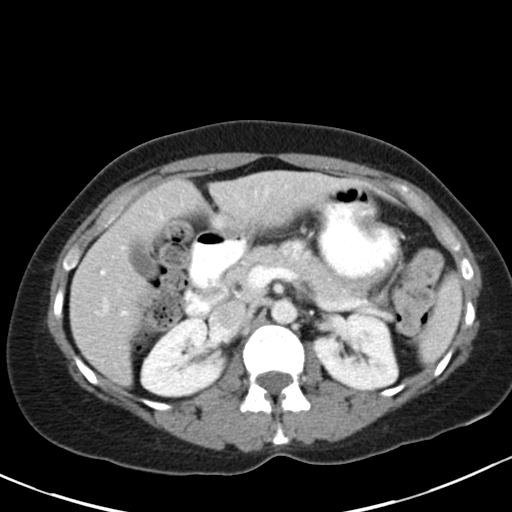
[im 69/81  soft-tissue]
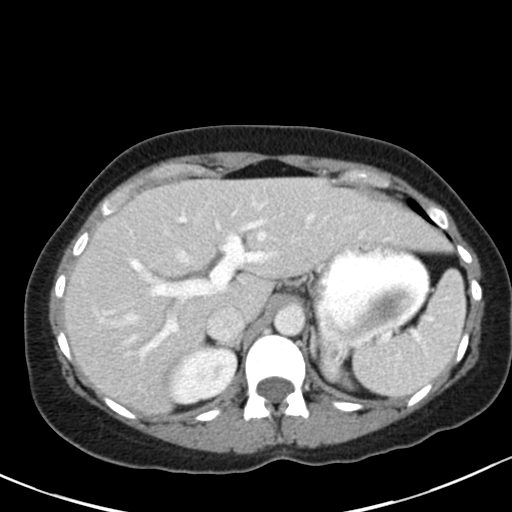
[im 75/81  soft-tissue]
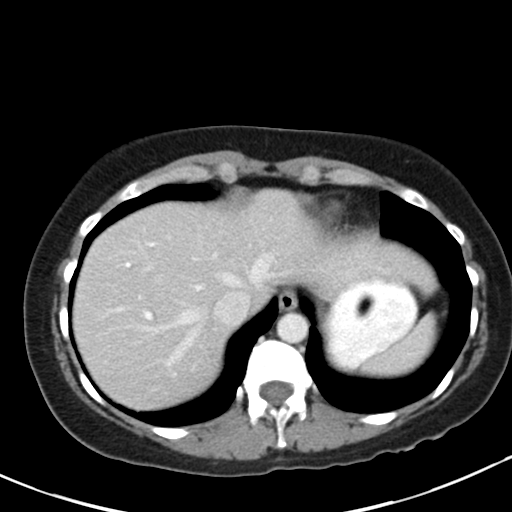

[Series 4: coronal a/|p · coronal · 0.57mm/px · 3 of 73 slices shown]
[im 25/73  soft-tissue]
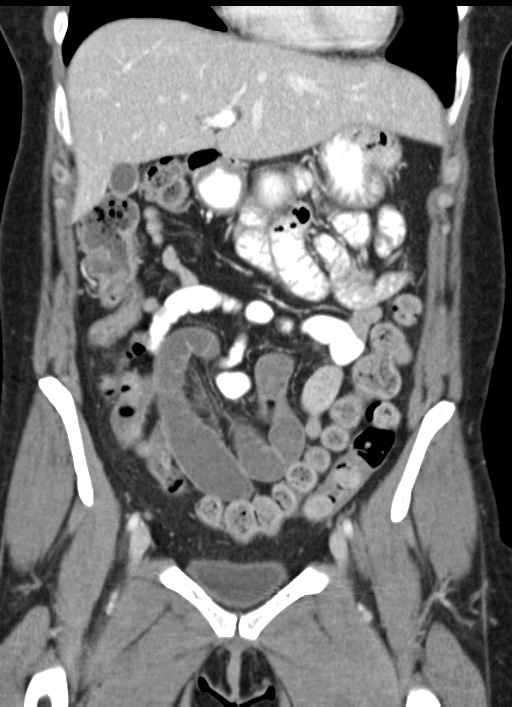
[im 33/73  soft-tissue]
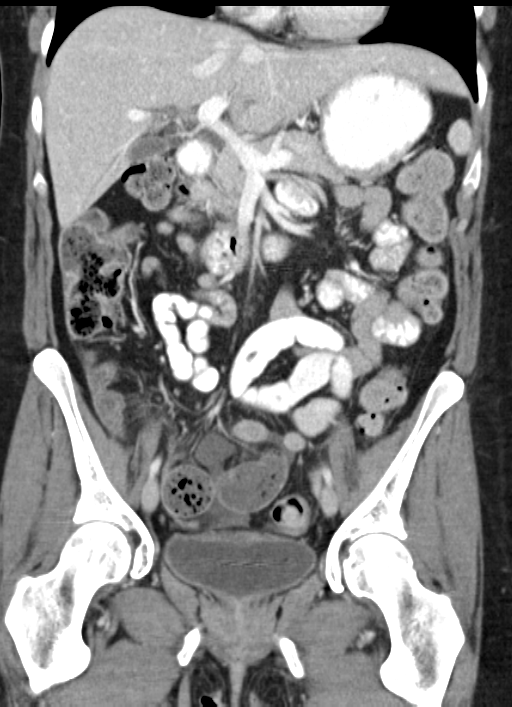
[im 41/73  soft-tissue]
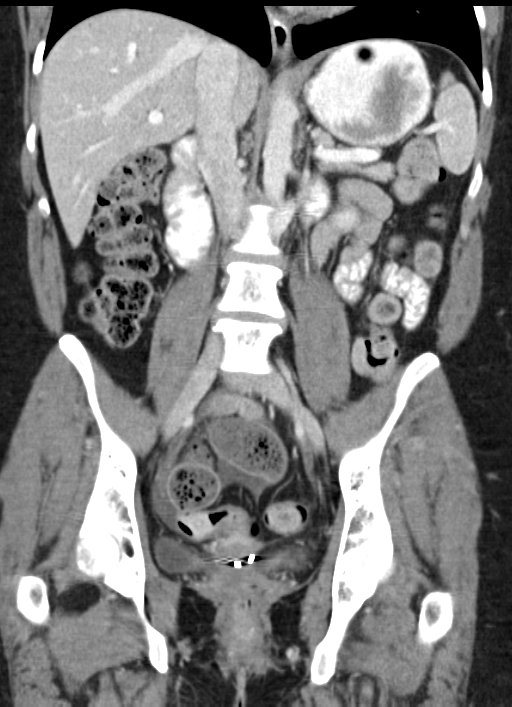

[15 of 46 positions shown; findings below may reference images not displayed]

FINDINGS: The included lung bases are clear.

Subcentimeter hypodensity in the right hepatic dome, unchanged.
Focal fatty infiltration adjacent with falciform ligament. The
gallbladder is minimally distended. The spleen, pancreas, and
adrenal glands are normal. Kidneys demonstrate symmetric enhancement
without hydronephrosis or perinephric stranding.

The stomach is physiologically distended with contrast. The proximal
small bowel loops are normal. Fluid-filled dilated small bowel loops
in the central and right lower pelvis with feculization of small
bowel contents. This is just proximal to a moderate length segment
of small bowel thickening with adjacent inflammatory change,
transition point best appreciated on coronal image 27/73. There is
adjacent mesenteric edema and small amount of free fluid. No
pneumatosis, perforation or abscess. The appearance and distribution
is similar to that of prior exam. There is a moderate volume of
stool throughout the colon without colonic wall thickening. The
appendix is not definitively identified.

No retroperitoneal adenopathy. Abdominal aorta is normal in caliber.
In stents note a retro aortic left renal vein.

Within the pelvis the urinary bladder is physiologically distended.
Uterus is small with fiducial markers in the region of the cervix.
Small amount of free fluid in the pelvis. No inguinal or pelvic
adenopathy.

There are no acute or suspicious osseous abnormalities.
IMPRESSION: Distal small bowel inflammation with proximal dilatation, suggesting
enteritis with partial proximal obstruction. The overall appearance
appears similar to that of prior exam. This may reflect sequela of
chronic radiation enteritis. Small amount of mesenteric free fluid
and free fluid in the pelvis is likely reactive, no perforation or
abscess.

## 2016-12-25 IMAGING — CR DG ABDOMEN 2V
2 series · 2 of 2 positions shown · non-contrast
Comparison: CT scan of the abdomen and pelvis of June 28, 2014

CLINICAL DATA: Mid abdominal pain with nausea and vomiting

EXAM:
ABDOMEN - 2 VIEW

[w abdomen upright]
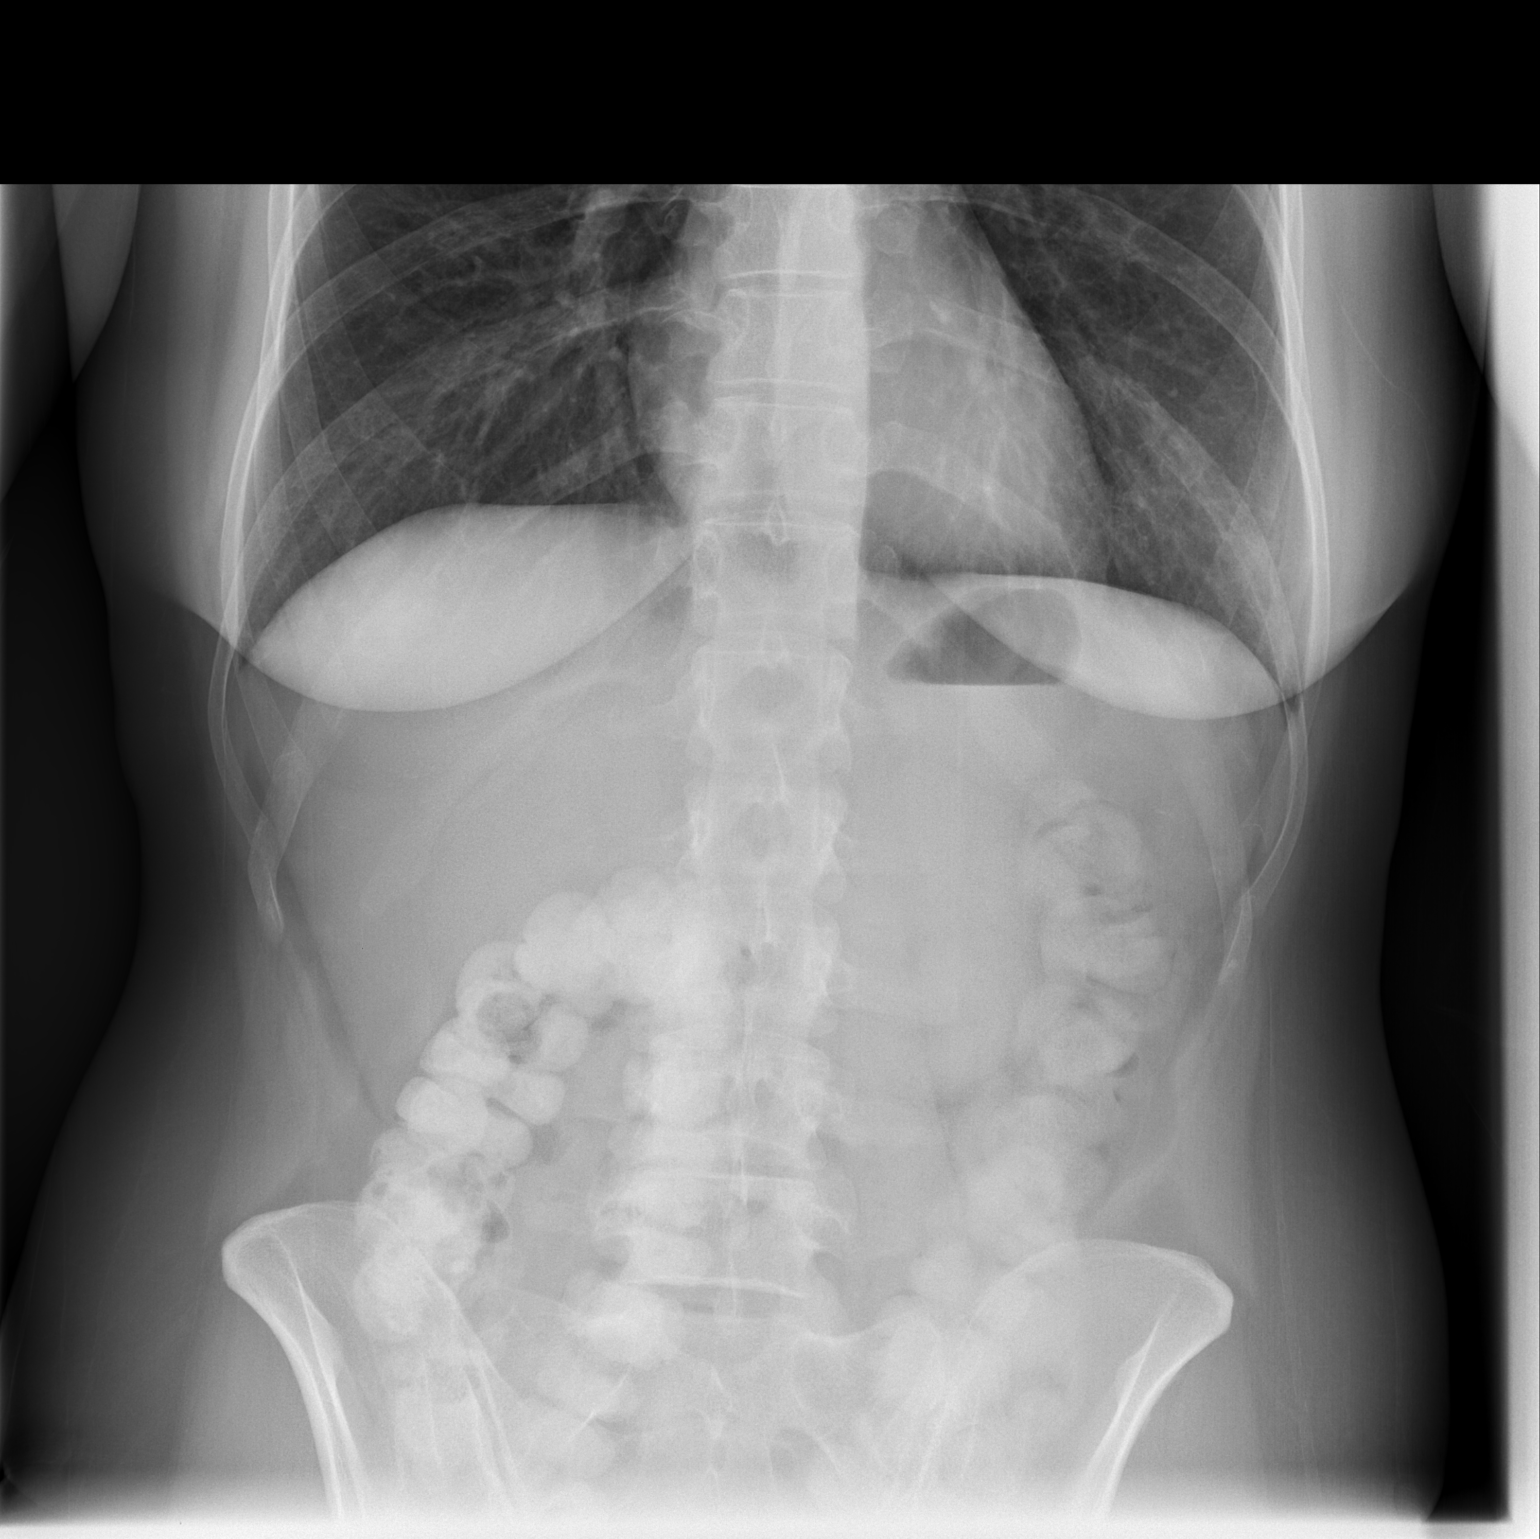

[t abdomen supine]
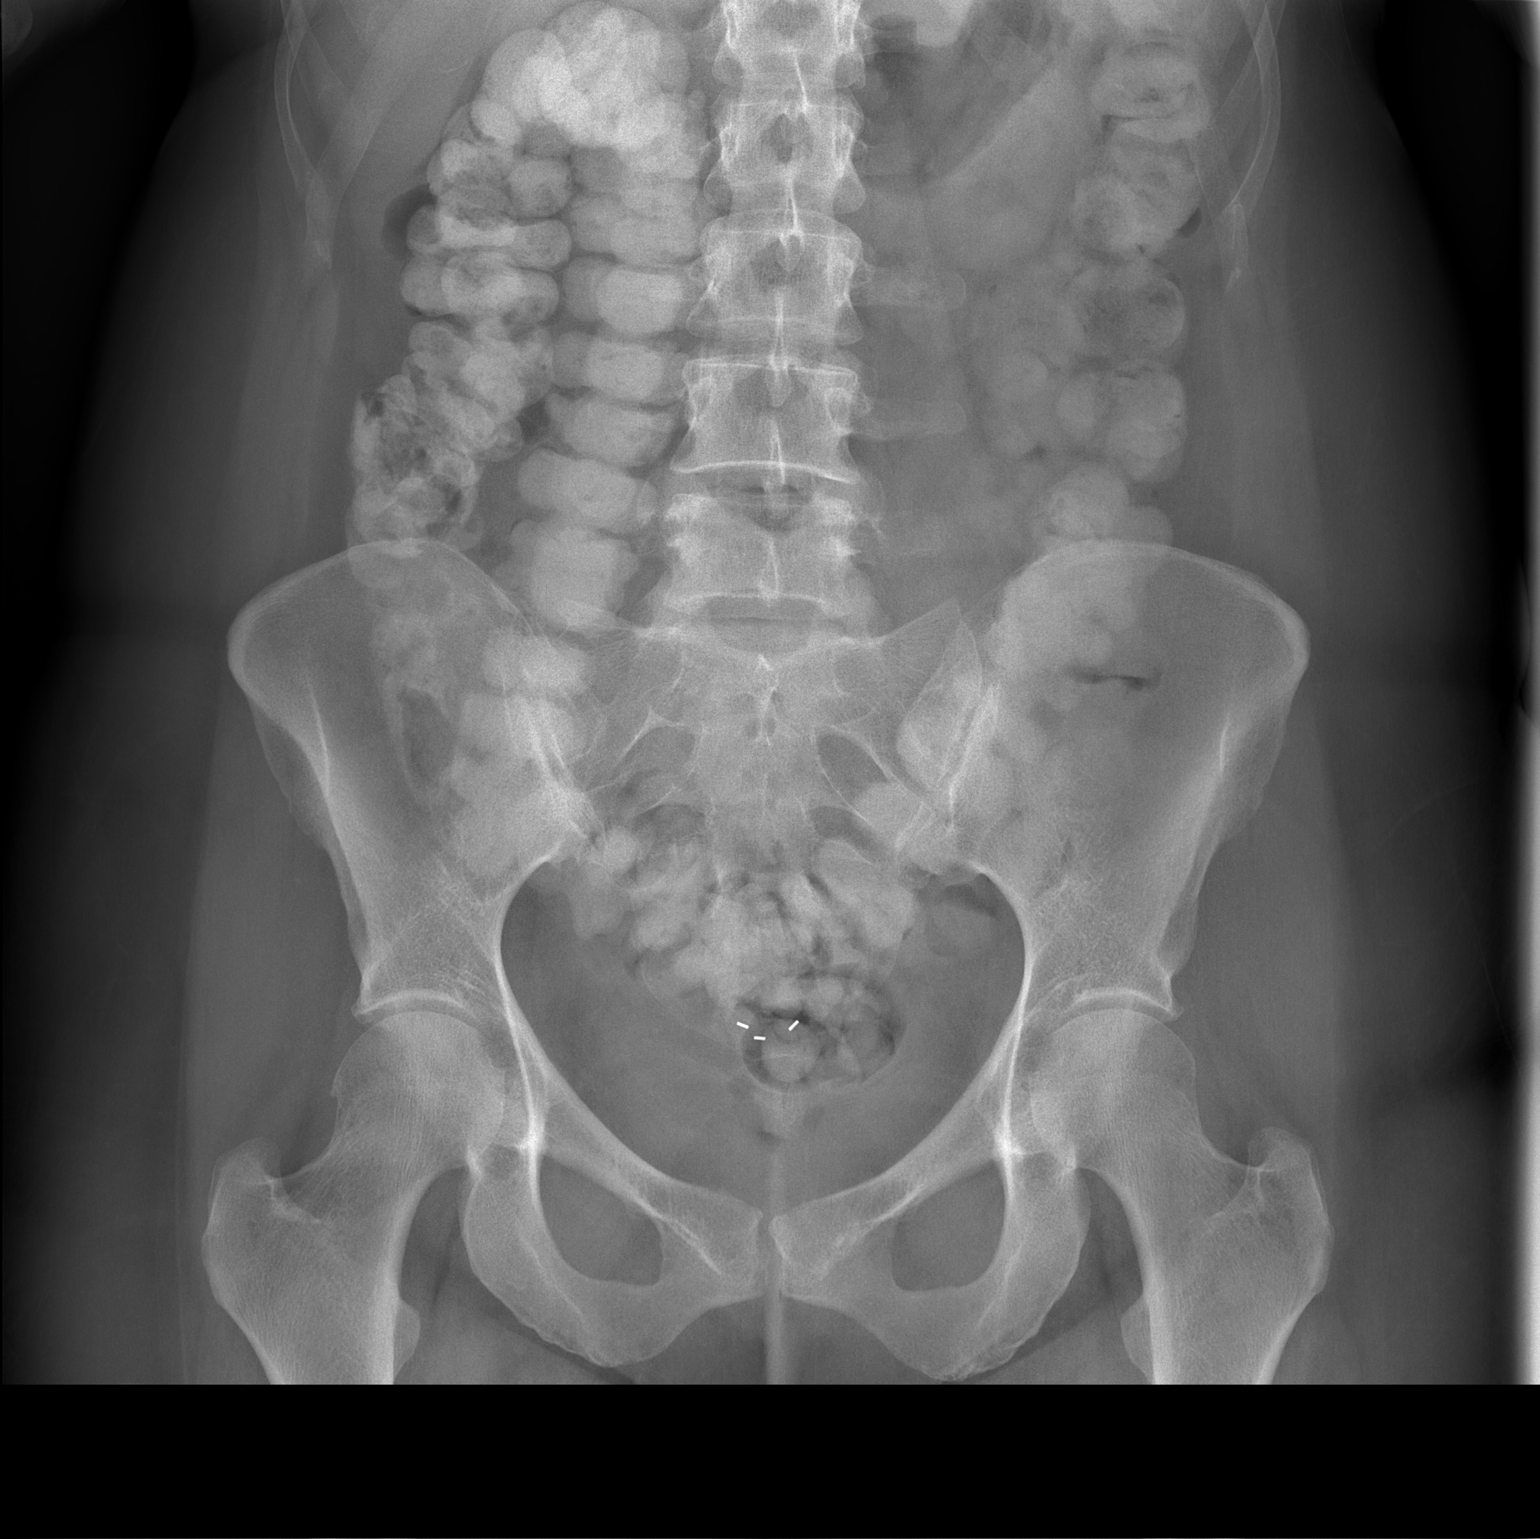

[2 of 2 positions shown; findings below may reference images not displayed]

FINDINGS: The bowel gas pattern is normal. There is contrast within the normal
calibered colon and rectum. There are no free extraluminal gas
collections. There is air and fluid in the stomach. The lung bases
are clear. There are surgical clips projecting in the mid pelvis.
There is no evidence of free air. No radio-opaque calculi or other
significant radiographic abnormality is seen.
IMPRESSION: No acute intra-abdominal abnormality is demonstrated.

## 2017-02-01 ENCOUNTER — Encounter (HOSPITAL_BASED_OUTPATIENT_CLINIC_OR_DEPARTMENT_OTHER): Payer: BLUE CROSS/BLUE SHIELD | Attending: Internal Medicine

## 2017-02-01 DIAGNOSIS — Z8541 Personal history of malignant neoplasm of cervix uteri: Secondary | ICD-10-CM | POA: Insufficient documentation

## 2017-02-01 DIAGNOSIS — Z87891 Personal history of nicotine dependence: Secondary | ICD-10-CM | POA: Diagnosis not present

## 2017-02-01 DIAGNOSIS — Z9221 Personal history of antineoplastic chemotherapy: Secondary | ICD-10-CM | POA: Diagnosis not present

## 2017-02-01 DIAGNOSIS — Y842 Radiological procedure and radiotherapy as the cause of abnormal reaction of the patient, or of later complication, without mention of misadventure at the time of the procedure: Secondary | ICD-10-CM | POA: Insufficient documentation

## 2017-02-01 DIAGNOSIS — N3041 Irradiation cystitis with hematuria: Secondary | ICD-10-CM | POA: Diagnosis present

## 2017-02-01 DIAGNOSIS — N3289 Other specified disorders of bladder: Secondary | ICD-10-CM | POA: Diagnosis not present

## 2017-02-04 ENCOUNTER — Other Ambulatory Visit: Payer: Self-pay | Admitting: Internal Medicine

## 2017-02-04 ENCOUNTER — Ambulatory Visit (HOSPITAL_COMMUNITY)
Admission: RE | Admit: 2017-02-04 | Discharge: 2017-02-04 | Disposition: A | Discharge status: Home / Self Care | Payer: BLUE CROSS/BLUE SHIELD | Source: Ambulatory Visit | Attending: Internal Medicine | Admitting: Internal Medicine

## 2017-02-04 DIAGNOSIS — T148XXA Other injury of unspecified body region, initial encounter: Secondary | ICD-10-CM

## 2017-02-04 DIAGNOSIS — X58XXXA Exposure to other specified factors, initial encounter: Secondary | ICD-10-CM | POA: Insufficient documentation

## 2017-02-11 DIAGNOSIS — N3041 Irradiation cystitis with hematuria: Secondary | ICD-10-CM | POA: Diagnosis not present

## 2017-02-12 DIAGNOSIS — N3041 Irradiation cystitis with hematuria: Secondary | ICD-10-CM | POA: Diagnosis not present

## 2017-02-15 DIAGNOSIS — N3041 Irradiation cystitis with hematuria: Secondary | ICD-10-CM | POA: Diagnosis not present

## 2017-02-16 DIAGNOSIS — N3041 Irradiation cystitis with hematuria: Secondary | ICD-10-CM | POA: Diagnosis not present

## 2017-02-18 DIAGNOSIS — N3041 Irradiation cystitis with hematuria: Secondary | ICD-10-CM | POA: Diagnosis not present

## 2017-02-19 ENCOUNTER — Other Ambulatory Visit: Payer: Self-pay | Admitting: Internal Medicine

## 2017-02-19 DIAGNOSIS — N3041 Irradiation cystitis with hematuria: Secondary | ICD-10-CM | POA: Diagnosis not present

## 2017-02-22 DIAGNOSIS — N3041 Irradiation cystitis with hematuria: Secondary | ICD-10-CM | POA: Diagnosis not present

## 2017-02-22 NOTE — Telephone Encounter (Signed)
ok 

## 2017-02-22 NOTE — Telephone Encounter (Signed)
Is this okay to refill? 

## 2017-02-23 DIAGNOSIS — N3041 Irradiation cystitis with hematuria: Secondary | ICD-10-CM | POA: Diagnosis not present

## 2017-02-24 DIAGNOSIS — N3041 Irradiation cystitis with hematuria: Secondary | ICD-10-CM | POA: Diagnosis not present

## 2017-02-25 DIAGNOSIS — N3041 Irradiation cystitis with hematuria: Secondary | ICD-10-CM | POA: Diagnosis not present

## 2017-02-26 ENCOUNTER — Encounter (HOSPITAL_BASED_OUTPATIENT_CLINIC_OR_DEPARTMENT_OTHER): Payer: BLUE CROSS/BLUE SHIELD | Attending: Internal Medicine

## 2017-02-26 DIAGNOSIS — N3041 Irradiation cystitis with hematuria: Secondary | ICD-10-CM | POA: Diagnosis not present

## 2017-02-26 DIAGNOSIS — Z8541 Personal history of malignant neoplasm of cervix uteri: Secondary | ICD-10-CM | POA: Insufficient documentation

## 2017-02-26 DIAGNOSIS — N3289 Other specified disorders of bladder: Secondary | ICD-10-CM | POA: Insufficient documentation

## 2017-02-26 DIAGNOSIS — Y842 Radiological procedure and radiotherapy as the cause of abnormal reaction of the patient, or of later complication, without mention of misadventure at the time of the procedure: Secondary | ICD-10-CM | POA: Diagnosis not present

## 2017-03-01 DIAGNOSIS — N3041 Irradiation cystitis with hematuria: Secondary | ICD-10-CM | POA: Diagnosis not present

## 2017-03-02 DIAGNOSIS — N3041 Irradiation cystitis with hematuria: Secondary | ICD-10-CM | POA: Diagnosis not present

## 2017-03-03 DIAGNOSIS — N3041 Irradiation cystitis with hematuria: Secondary | ICD-10-CM | POA: Diagnosis not present

## 2017-03-08 DIAGNOSIS — N3041 Irradiation cystitis with hematuria: Secondary | ICD-10-CM | POA: Diagnosis not present

## 2017-03-09 DIAGNOSIS — N3041 Irradiation cystitis with hematuria: Secondary | ICD-10-CM | POA: Diagnosis not present

## 2017-03-10 DIAGNOSIS — N3041 Irradiation cystitis with hematuria: Secondary | ICD-10-CM | POA: Diagnosis not present

## 2017-03-11 DIAGNOSIS — N3041 Irradiation cystitis with hematuria: Secondary | ICD-10-CM | POA: Diagnosis not present

## 2017-03-12 DIAGNOSIS — N3041 Irradiation cystitis with hematuria: Secondary | ICD-10-CM | POA: Diagnosis not present

## 2017-03-15 DIAGNOSIS — N3041 Irradiation cystitis with hematuria: Secondary | ICD-10-CM | POA: Diagnosis not present

## 2017-03-16 DIAGNOSIS — N3041 Irradiation cystitis with hematuria: Secondary | ICD-10-CM | POA: Diagnosis not present

## 2017-03-19 DIAGNOSIS — N3041 Irradiation cystitis with hematuria: Secondary | ICD-10-CM | POA: Diagnosis not present

## 2017-03-22 DIAGNOSIS — N3041 Irradiation cystitis with hematuria: Secondary | ICD-10-CM | POA: Diagnosis not present

## 2017-03-23 DIAGNOSIS — N3041 Irradiation cystitis with hematuria: Secondary | ICD-10-CM | POA: Diagnosis not present

## 2017-03-24 DIAGNOSIS — N3041 Irradiation cystitis with hematuria: Secondary | ICD-10-CM | POA: Diagnosis not present

## 2017-03-26 ENCOUNTER — Encounter (HOSPITAL_BASED_OUTPATIENT_CLINIC_OR_DEPARTMENT_OTHER): Payer: BLUE CROSS/BLUE SHIELD | Attending: Internal Medicine

## 2017-03-26 DIAGNOSIS — Y842 Radiological procedure and radiotherapy as the cause of abnormal reaction of the patient, or of later complication, without mention of misadventure at the time of the procedure: Secondary | ICD-10-CM | POA: Diagnosis not present

## 2017-03-26 DIAGNOSIS — Z8541 Personal history of malignant neoplasm of cervix uteri: Secondary | ICD-10-CM | POA: Insufficient documentation

## 2017-03-26 DIAGNOSIS — N3289 Other specified disorders of bladder: Secondary | ICD-10-CM | POA: Insufficient documentation

## 2017-03-26 DIAGNOSIS — N3041 Irradiation cystitis with hematuria: Secondary | ICD-10-CM | POA: Insufficient documentation

## 2017-03-29 DIAGNOSIS — N3289 Other specified disorders of bladder: Secondary | ICD-10-CM | POA: Diagnosis not present

## 2017-03-30 DIAGNOSIS — N3289 Other specified disorders of bladder: Secondary | ICD-10-CM | POA: Diagnosis not present

## 2017-04-01 DIAGNOSIS — N3289 Other specified disorders of bladder: Secondary | ICD-10-CM | POA: Diagnosis not present

## 2017-05-29 ENCOUNTER — Other Ambulatory Visit: Payer: Self-pay | Admitting: Internal Medicine

## 2017-07-02 ENCOUNTER — Encounter: Payer: Self-pay | Admitting: Internal Medicine

## 2017-07-02 ENCOUNTER — Ambulatory Visit (INDEPENDENT_AMBULATORY_CARE_PROVIDER_SITE_OTHER): Payer: BLUE CROSS/BLUE SHIELD | Admitting: Internal Medicine

## 2017-07-02 VITALS — BP 110/60 | HR 98 | Ht 63.0 in | Wt 119.2 lb

## 2017-07-02 DIAGNOSIS — E063 Autoimmune thyroiditis: Secondary | ICD-10-CM | POA: Diagnosis not present

## 2017-07-02 DIAGNOSIS — E2740 Unspecified adrenocortical insufficiency: Secondary | ICD-10-CM

## 2017-07-02 DIAGNOSIS — E041 Nontoxic single thyroid nodule: Secondary | ICD-10-CM

## 2017-07-02 NOTE — Patient Instructions (Signed)
Please come back for labs fasting, between 8-9 am, after being off the Fentanyl patch for 12h.  Continue levothyroxine 25 mcg daily.  Take the thyroid hormone every day, with water, at least 30 minutes before breakfast, separated by at least 4 hours from: - acid reflux medications - calcium - iron - multivitamins  Please come back for a follow-up appointment in 6 months.

## 2017-07-02 NOTE — Progress Notes (Addendum)
Patient ID: Sue Benson, female   DOB: 10-Jul-1975, 42 y.o.   MRN: [id number]   HPI  Sue Benson is a 42 y.o.-year-old female, returning for central adrenal insufficiency, early menopause and also Hashimoto's thyroiditis and thyroid nodules. Last visit 9 months ago. ObGyn: Dr. Hester Mates  Patient has a history of radiation cystitis and she is on pain medicines for this.  She stopped fentanyl patch in 06/2016 and increase her oxycodone.  In 2017, she added Lamictal for pain and depression.  Since last visit, however, she had to restart fentanyl patch 11/2016.  At last visit, we checked a cosyntropin stimulation test that returned to normal, so we discussed about stopping hydrocortisone but keeping it at hand.  She is scheduled this appointment as she is losing weight (11 lbs) and has a lack of appetite - started after last visit. Now gained some weight back after staying at her parents' house.  She also has depression >> missing classes >> now on medical leave - for this semester.   Reviewed and addended history: Pt. 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 She did not have to 2x the dose since last visit or get an iv/im injection. She has a med alert bracelet mentioning adrenal insufficiency.  She had to repeated cosyntropin stimulation test with abnormal  results:  Component     Latest Ref Rng 10/11/2014 10/11/2014 10/11/2014        10:02 AM 10:39 AM 11:10 AM  Cortisol, Plasma      5.3 10.5 12.6  C206 ACTH     6 - 50 pg/mL 6      Component     Latest Ref Rng & Units 07/24/2015 07/24/2015 07/24/2015         8:55 AM  9:29 AM  9:52 AM  Cortisol, Plasma     ug/dL 4.2 11.2 14.1   She had Prednisone tapers before  - last 10 years ago.  No h/o Depo-provera, Megace, po ketoconazole, phenytoin, rifampin, chronic fluconazole use.  She is on Prometrium, which should not interfere with the pituitary-adrenal axis.  + h/o autoimmune diseases in father (MG). + NSAIDs use. No h/o generalized infections or HIV. No IVDA. + h/o head injuries (concussions - 2 more recently - horseback riding). No severe HA.   Pituitary MRI (01/23/2015): no pituitary dysfunction or empty sella >> likely her AI is from previous opiate use. Brain MRI (03/06/2015): normal.  Remaining pituitary labs were normal, except for high LH and FSH, indicating early menopause (she had RxTx and ChTx for cervical cancer in 2010): Component     Latest Ref Rng & Units 11/19/2014  IGF-I, LC/MS     52 - 328 ng/mL 156  Z-Score (Female)     -2.0 - 2.0 SD 0.2  TSH     0.35 - 4.50 uIU/mL 2.60  Prolactin     ng/mL 8.9  LH     mIU/mL 39.56  FSH     mIU/ML 114.0   She is on estradiol patch and Prometrium.  Of note, after last visit, a cosyntropin stimulation test was normal,  Most likely after coming off the fentanyl patch: Component     Latest Ref Rng & Units 10/29/2016 10/29/2016 10/29/2016         9:25 AM 10:05 AM 10:44 AM  Cortisol, Plasma     ug/dL 18.4 22.7 27.4  At that time, we stopped hydrocortisone.  In 2016 >> she c/o "lump" in neck >> we checked a thyroid ultrasound that showed few thyroid nodules the largest being 6 mm, in the left neck   Reviewed her TFTs: Lab Results  Component Value Date   TSH 2.04 10/29/2016   TSH 0.96 11/08/2015   TSH 5.19 (H) 09/09/2015    TSH 4.57 (H) 07/24/2015   TSH 2.60 11/19/2014   TSH 2.863 06/30/2014   FREET4 0.85 10/29/2016   FREET4 0.69 11/08/2015   FREET4 0.66 09/09/2015   FREET4 0.69 07/24/2015   FREET4 0.72 11/19/2014   T3FREE 2.5 09/09/2015   T3FREE 2.3 07/24/2015   T3FREE 2.7 11/19/2014   TPO Abs were elevated >> Hashimoto's thyroiditis: Component     Latest Ref Rng & Units 10/11/2014  Thyroperoxidase Ab SerPl-aCnc     <9 IU/mL 25 (H)   We started LT4 25 mcg daily >> she started to feel [facility] better after this.  Pt is on levothyroxine 25 mcg daily, taken: - in am - fasting - at least 30 min from b'fast - no Ca, Fe, MVI, PPIs - not on Biotin  She initially gained weight on amitriptyline in 2016, and lost the weight afterwards, after coming off the medication.  She continues to have a lot of stress at work. 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 She travels to [facility] for med appt.  ROS: Constitutional: + Fatigue, + weight loss and weight gain, + nocturia, + dysuria Eyes: + blurry vision, no xerophthalmia, + bulging but eyes ENT: no sore throat, no nodules palpated in throat, no dysphagia, no odynophagia, no hoarseness Cardiovascular: no CP/+ SOB related to anxiety/no palpitations/no leg swelling Respiratory: no cough/+ SOB related to anxiety/no wheezing Gastrointestinal: + N/+ V/no D/+ C/no acid reflux Musculoskeletal: no muscle aches/no joint aches Skin: no rashes, no hair loss Neurological: + Tremors/no numbness/no tingling/+ dizziness with standing, + HA   I reviewed pt's medications, allergies, PMH, social hx, family hx, and changes were documented in the history of present illness. Otherwise, unchanged from my initial visit note.  Stop Chantix since last visit.  Started vaginal Valium, is back on gabapentin, extended release.  Past Medical History:  Diagnosis Date  . Chronic constipation    SECONDARY TO RADIATION  . Depression   . Dyspareunia   . Frequency of urination   . History  of cervical cancer ONCOLOGIST--  [place]   DX 2010--  S/P RADIATION (EXTERNAL AND BRACHY) AND CHEMO SPRING 2010--  NO RECURRENCE  . History of concussion    NO RESIDUAL  . History of kidney stones   . Mild acid reflux   . Nerve pain    legs  . Nocturia   .  Radiation cystitis   . Wears glasses    Past Surgical History:  Procedure Laterality Date  . CERVICAL CONE BIOPSY  FALL 2009  . CYSTO/ BILATERAL URETEROSCOPIC STONE EXTRACTIONS AND STENT PLACEMENT  FALL 2013  . CYSTOSCOPY WITH BIOPSY N/A 02/03/2013   Procedure: CYSTOSCOPY WITH BIOPSY, CAUTERIZATION OF THE BIOPSY SITES INSTILLATION OF PYRIDIUM  AND RECTAL EXAM UNDER ANESTHESIA;  Surgeon: Ailene Rud, MD;  Location: [facility];  Service: Urology;  Laterality: N/A;  . EXTRACORPOREAL SHOCK WAVE LITHOTRIPSY  SUMMER 2013  . TONSILLECTOMY   FALL 2010-- LEFT SIDE   2007--- RIGHT SIDE  AND REMOVAL BENIGN CYST   Social History   Social History  . Marital Status: Married    Spouse Name: N/A  . Number of Children: 0   Occupational History  . professor   Social History Main Topics  . Smoking status: Former Smoker -- 0.50 packs/day for 20 years    Types: Cigarettes  . Smokeless tobacco: Never Used     Comment: CURRENTLY USES ECIG:  less than a cartridge a day  . Alcohol Use: No  . Drug Use: No   Social History Narrative   Lives witih husband who had a stroke   Current Outpatient Medications on File Prior to Visit  Medication Sig Dispense Refill  . Amphetamine-Dextroamphetamine (ADDERALL PO) Take 10 mg by mouth daily as needed (focus).     . diazepam (VALIUM) 10 MG tablet 10 mg. Vaginally at night    . doxylamine, Sleep, (UNISOM) 25 MG tablet Take 25 mg by mouth at bedtime as needed.    . DULoxetine (CYMBALTA) 30 MG capsule Take 20 mg by mouth 5 (five) times daily. Takes 60mg  in am and 40mg  in pm, updated 5/22    . estradiol (VIVELLE-DOT) 0.05 MG/24HR patch Place 2 patches onto the skin 2 (two) times a  week.    . Gabapentin Enacarbil (HORIZANT) 600 MG TBCR 600 mg. Take 600 mg at dinner    . hydrocortisone (CORTEF) 5 MG tablet TAKE 2 TABLET BY MOUTH EVERY MORNING AND 1 TABLET BY MOUTH EVERY AFTERNOON 120 tablet 0  . ibuprofen (ADVIL,MOTRIN) 200 MG tablet Take 200 mg by mouth every 6 (six) hours as needed.    Marland Kitchen L-Methylfolate (DEPLIN) 15 MG TABS Take 1 tablet by mouth daily.     Marland Kitchen lamoTRIgine (LAMICTAL) 25 MG tablet Take 250 mg by mouth daily.     Marland Kitchen levothyroxine (SYNTHROID, LEVOTHROID) 25 MCG tablet TAKE 1 TABLET BY MOUTH DAILY BEFORE BREAKFAST. NEED TO MAKE APPT WITH DOCTOR FOR FURTHER REFILLS 30 tablet 0  . levothyroxine (SYNTHROID, LEVOTHROID) 25 MCG tablet TAKE 1 TABLET BY MOUTH DAILY BEFORE BREAKFAST( KEEP APPOINTMENT) 90 tablet 0  . Melatonin 300 MCG TABS Take 1 tablet by mouth as needed. Reported on 07/22/2015    . Methen-Hyosc-Meth Blue-Na Phos (UROGESIC-BLUE) 81.6 MG TABS Take 1 tablet by mouth 2 (two) times daily. Patient stated 5/22 she takes this med bid, not tid prn    . ondansetron (ZOFRAN) 4 MG tablet Take 1 tablet (4 mg total) by mouth every 8 (eight) hours as needed for nausea or vomiting. 20 tablet 0  . oxyCODONE (OXY IR/ROXICODONE) 5 MG immediate release tablet Take 5 mg by mouth every 6 (six) hours as needed for moderate pain.     . polyethylene glycol (MIRALAX / GLYCOLAX) packet Take 17 g by mouth daily. Reported on 07/22/2015    . Probiotic Product (PROBIOTIC ADVANCED PO) Take 2 capsules  by mouth daily.    . progesterone (PROMETRIUM) 100 MG capsule Take 100 mg by mouth daily.    . varenicline (CHANTIX STARTING MONTH PAK) 0.5 MG X 11 & 1 MG X 42 tablet Reported on 07/22/2015     No current facility-administered medications on file prior to visit.    Allergies  Allergen Reactions  . Penicillins Anaphylaxis  . Prilosec [Omeprazole] Other (See Comments)    Patient states that it "shut her stomach down too much."  . Ceclor [Cefaclor] Rash   Family History  Problem Relation  Age of Onset  . Diabetes Mother   . Fibromyalgia Mother   . Myasthenia gravis Father    PE: BP 110/60   Pulse 98   Ht 5\' 3"  (1.6 m)   Wt 119 lb 3.2 oz (54.1 kg)   SpO2 98%   BMI 21.12 kg/m  Wt Readings from Last 3 Encounters:  07/02/17 119 lb 3.2 oz (54.1 kg)  10/15/16 120 lb (54.4 kg)  07/22/15 129 lb (58.5 kg)   Constitutional: normal weight, in NAD Eyes: PERRLA, EOMI, no exophthalmos ENT: moist mucous membranes, no thyromegaly, no cervical lymphadenopathy Cardiovascular: tachycardia, RR, No MRG Respiratory: CTA B Gastrointestinal: abdomen soft, NT, ND, BS+ Musculoskeletal: no deformities, strength intact in all 4 Skin: moist, warm, no rashes Neurological: + tremor with outstretched hands, DTR normal in all 4  ASSESSMENT: 1. Central adrenal insufficiency  2. Thyroid nodules - Largest: 6 mm left lobe  - Thyroid ultrasound (09/17/2014):  Right thyroid lobe: 4.4 x 1.1 x 1.2 cm. 3 mm lower pole hypoechoic nodule.  Left thyroid lobe: 3.9 x 1.0 x 1.3 cm. Multiple small nodules are present throughout the lobe. The largest is in the lower pole measures 6 mm and is hypoechoic.  Isthmus Thickness: 1 mm. No nodules visualized.  Lymphadenopathy None visualized.  3. Hashimoto's thyroiditis - dx in 09/2014  4. Early menopause - likely 2/2 RxTx and ChTx for cervical cancer in 2010  PLAN:  1.Central adrenal insufficiency - Patient has a history of a low cortisol level and suboptimal cosyntropin stimulation tests most likely due to opiate use.  Her first l abnormal set of tests were obtained while in the hospital with dehydration, hypotension, abdominal pain, in 2016.  These were complications of radiation enteritis and subsequent small bowel obstruction.  At that time, she was given morphine and Dilaudid, which are known to suppress the pituitary-adrenal axis, however, repeated cortisol level after discharge was still abnormal and the new cosyntropin stimulation test confirms  central adrenal insufficiency.  Of note, pituitary MRI was negative for any pituitary structural defects.  She was on hydrocortisone, however, after she stopped fentanyl in 06/2016, her cosyntropin stimulation test returned normal, and I advised her to taper this down and keep it at hand just in case she has significant symptoms for adrenal insufficiency: Abdominal pain, significant fatigue, nausea/vomiting.  However, unfortunately, the fentanyl patch was restarted in 11/2016 and she started to lose weight and be more depressed afterwards.  She started to gain the weight back after she moved in with her parents but she continues to be very depressed.  She is out of school for medical leave for this semester. - She scheduled this appointment as she was worried about possible hypothyroidism or return of her adrenal insufficiency. - Per review of her symptoms, I feel that many of them could be attributed to her depression or side effects from her medications.  For example, she is tachycardic today, however,  she had to take several other girls and drank coffee before coming to be able to stay awake while driving. -Therefore, we opted to repeat a cosyntropin stimulation test between 8 and 9 AM to see if she is again adrenal insufficient and will check her TFTs at that time, also. - I advised her to: Patient Instructions  Please come back for labs fasting, between 8-9 am, after being off the Fentanyl patch for 12h.  Continue levothyroxine 25 mcg daily.  Take the thyroid hormone every day, with water, at least 30 minutes before breakfast, separated by at least 4 hours from: - acid reflux medications - calcium - iron - multivitamins  Please come back for a follow-up appointment in 6 months.  2. Thyroid nodules - Not worrisome, per ultrasound from 08/2014 - Plan to repeat the ultrasound if she develops neck compression symptoms; none present now.  3. Hashimoto thyroiditis  - dx'ed in 2016, based on  mildly elevated TPO Ab's - At last visit, TSH was slightly high and this persisted in being above the upper limit of normal so we started levothyroxine 25 g daily. TFTs normalized on this dose.  She is wondering whether this dose can be increased, but I advised her that based on the last TSH, I would not suggest to increase it.   -- latest thyroid labs reviewed with pt >> normal in 10/2016 - she continues on LT4 25 mcg daily - pt feels good on this dose. - we discussed about taking the thyroid hormone every day, with water, >30 minutes before breakfast, separated by >4 hours from acid reflux medications, calcium, iron, multivitamins. Pt. is taking it correctly. - will check thyroid tests at next lab draw: TSH and fT4 - If labs are abnormal, she will need to return for repeat TFTs in 1.5 months  4. Early menopause - On HRT: Managed by OB/GYN  Needs refills for levothyroxine.  - time spent with the patient: 40 minutes, of which >50% was spent in obtaining information about her symptoms, reviewing her previous labs, evaluations, and treatments, counseling her about her conditions (please see the discussed topics above), and developing a plan to further investigate and treat them; she had a number of questions which I addressed.  07/12/17: Addendum -labs still pending.  Will close note and addend the results when they return.  Component     Latest Ref Rng & Units 07/16/2017 07/16/2017 07/16/2017        11:50 AM 12:27 PM 12:59 PM  TSH     0.35 - 4.50 uIU/mL 1.14    Cortisol, Plasma     ug/dL 9.4 22.1 23.5  C206 ACTH     6 - 50 pg/mL 8    T4,Free(Direct)     0.60 - 1.60 ng/dL 0.71     All labs are normal.  Will refill her levothyroxine.  No need to restart hydrocortisone for now.  Philemon Kingdom, MD PhD [facility]

## 2017-07-15 ENCOUNTER — Other Ambulatory Visit: Payer: BLUE CROSS/BLUE SHIELD

## 2017-07-16 ENCOUNTER — Other Ambulatory Visit (INDEPENDENT_AMBULATORY_CARE_PROVIDER_SITE_OTHER): Payer: BLUE CROSS/BLUE SHIELD

## 2017-07-16 DIAGNOSIS — E063 Autoimmune thyroiditis: Secondary | ICD-10-CM

## 2017-07-16 DIAGNOSIS — E2740 Unspecified adrenocortical insufficiency: Secondary | ICD-10-CM

## 2017-07-16 LAB — CORTISOL
CORTISOL PLASMA: 22.1 ug/dL
CORTISOL PLASMA: 23.5 ug/dL
Cortisol, Plasma: 9.4 ug/dL

## 2017-07-16 LAB — T4, FREE: Free T4: 0.71 ng/dL (ref 0.60–1.60)

## 2017-07-16 LAB — TSH: TSH: 1.14 u[IU]/mL (ref 0.35–4.50)

## 2017-07-16 MED ORDER — COSYNTROPIN NICU IV SYRINGE 0.25 MG/ML (STANDARD DOSE)
0.2500 mg | Freq: Once | INTRAVENOUS | Status: AC
  Administered 2017-07-16: 0.25 mg via INTRAVENOUS

## 2017-07-19 LAB — ACTH: C206 ACTH: 8 pg/mL (ref 6–50)

## 2017-07-23 MED ORDER — LEVOTHYROXINE SODIUM 25 MCG PO TABS: ORAL_TABLET | ORAL | 3 refills | Status: DC

## 2017-07-23 NOTE — Addendum Note (Signed)
Addended by: Philemon Kingdom on: 07/23/2017 08:59 AM   Modules accepted: Orders

## 2017-08-06 ENCOUNTER — Encounter: Payer: Self-pay | Admitting: Internal Medicine

## 2017-08-06 NOTE — Progress Notes (Signed)
Received office visit note from PCP from 08/06/2017. Patient had another set of TFTs checked on 07/21/2017, after having normal tests done in my office on 07/16/2017. TSH was 2.81 Free T4 1.0 PCP increased her levothyroxine from 25 to 50 mcg.

## 2017-10-27 ENCOUNTER — Telehealth: Payer: Self-pay

## 2017-10-27 NOTE — Telephone Encounter (Signed)
Per Dr. Cruzita Lederer, patient is scheduled tomorrow for a one year follow up but she was seen this June and does not need to be seen again until June 2020 unless patient still wants seen sooner.  Left message for patient to return our call at [phone].

## 2017-10-28 ENCOUNTER — Ambulatory Visit: Payer: BLUE CROSS/BLUE SHIELD | Admitting: Internal Medicine

## 2017-10-28 DIAGNOSIS — Z0289 Encounter for other administrative examinations: Secondary | ICD-10-CM

## 2018-02-04 ENCOUNTER — Other Ambulatory Visit: Payer: Self-pay | Admitting: Internal Medicine

## 2018-02-04 DIAGNOSIS — Z1231 Encounter for screening mammogram for malignant neoplasm of breast: Secondary | ICD-10-CM

## 2018-08-18 ENCOUNTER — Other Ambulatory Visit: Payer: Self-pay | Admitting: Internal Medicine

## 2018-11-01 ENCOUNTER — Telehealth: Payer: Self-pay | Admitting: Internal Medicine

## 2018-11-01 NOTE — Telephone Encounter (Signed)
Patient moved to [place], [place] and needs a Referral/Records for a new Endocrinologist sent to [facility] Fax# [phone] with a note that states please request an earlier appointment for patient (current appointment is scheduled for 01/17/19). Also patient requests a RX for Hydrocortisone sent to: [facility] at [place], [place], [place] Ph# [phone]. Patient is having problems with adrenal insufficiency due to being back on Fentanyl.  Patient's ph# [phone]

## 2018-11-02 ENCOUNTER — Other Ambulatory Visit: Payer: Self-pay | Admitting: Internal Medicine

## 2018-11-02 DIAGNOSIS — E041 Nontoxic single thyroid nodule: Secondary | ICD-10-CM

## 2018-11-02 DIAGNOSIS — E2740 Unspecified adrenocortical insufficiency: Secondary | ICD-10-CM

## 2018-11-02 DIAGNOSIS — E063 Autoimmune thyroiditis: Secondary | ICD-10-CM

## 2018-11-02 MED ORDER — HYDROCORTISONE 5 MG PO TABS: ORAL_TABLET | ORAL | 1 refills | Status: DC

## 2018-11-02 NOTE — Telephone Encounter (Signed)
I placed a referral, but Melissa please go ahead and fax the printout of the referral order along with her last visit note to the fax number provided. I refilled her hydrocortisone.

## 2018-11-03 NOTE — Telephone Encounter (Signed)
Referral and chart notes have been sent.

## 2020-10-28 DIAGNOSIS — E039 Hypothyroidism, unspecified: Secondary | ICD-10-CM | POA: Diagnosis present

## 2020-10-28 DIAGNOSIS — K838 Other specified diseases of biliary tract: Secondary | ICD-10-CM | POA: Insufficient documentation

## 2020-12-23 ENCOUNTER — Emergency Department (HOSPITAL_COMMUNITY): Payer: PPO

## 2020-12-23 ENCOUNTER — Other Ambulatory Visit: Payer: Self-pay

## 2020-12-23 ENCOUNTER — Encounter (HOSPITAL_COMMUNITY): Payer: Self-pay

## 2020-12-23 ENCOUNTER — Emergency Department (HOSPITAL_COMMUNITY)
Admission: EM | Admit: 2020-12-23 | Discharge: 2020-12-23 | Disposition: A | Discharge status: Home / Self Care | Payer: PPO | Attending: Emergency Medicine | Admitting: Emergency Medicine

## 2020-12-23 DIAGNOSIS — K529 Noninfective gastroenteritis and colitis, unspecified: Secondary | ICD-10-CM | POA: Insufficient documentation

## 2020-12-23 DIAGNOSIS — Z87891 Personal history of nicotine dependence: Secondary | ICD-10-CM | POA: Diagnosis not present

## 2020-12-23 DIAGNOSIS — R102 Pelvic and perineal pain: Secondary | ICD-10-CM | POA: Insufficient documentation

## 2020-12-23 DIAGNOSIS — Z85828 Personal history of other malignant neoplasm of skin: Secondary | ICD-10-CM | POA: Diagnosis not present

## 2020-12-23 DIAGNOSIS — R109 Unspecified abdominal pain: Secondary | ICD-10-CM | POA: Diagnosis present

## 2020-12-23 LAB — CBC WITH DIFFERENTIAL/PLATELET
Abs Immature Granulocytes: 0.09 10*3/uL — ABNORMAL HIGH (ref 0.00–0.07)
Basophils Absolute: 0.1 10*3/uL (ref 0.0–0.1)
Basophils Relative: 0 %
Eosinophils Absolute: 0.1 10*3/uL (ref 0.0–0.5)
Eosinophils Relative: 1 %
HCT: 47.6 % — ABNORMAL HIGH (ref 36.0–46.0)
Hemoglobin: 15.5 g/dL — ABNORMAL HIGH (ref 12.0–15.0)
Immature Granulocytes: 1 %
Lymphocytes Relative: 4 %
Lymphs Abs: 0.6 10*3/uL — ABNORMAL LOW (ref 0.7–4.0)
MCH: 30.3 pg (ref 26.0–34.0)
MCHC: 32.6 g/dL (ref 30.0–36.0)
MCV: 93.2 fL (ref 80.0–100.0)
Monocytes Absolute: 0.7 10*3/uL (ref 0.1–1.0)
Monocytes Relative: 4 %
Neutro Abs: 15.6 10*3/uL — ABNORMAL HIGH (ref 1.7–7.7)
Neutrophils Relative %: 90 %
Platelets: 488 10*3/uL — ABNORMAL HIGH (ref 150–400)
RBC: 5.11 MIL/uL (ref 3.87–5.11)
RDW: 13.2 % (ref 11.5–15.5)
WBC: 17.1 10*3/uL — ABNORMAL HIGH (ref 4.0–10.5)
nRBC: 0 % (ref 0.0–0.2)

## 2020-12-23 LAB — COMPREHENSIVE METABOLIC PANEL
ALT: 74 U/L — ABNORMAL HIGH (ref 0–44)
AST: 64 U/L — ABNORMAL HIGH (ref 15–41)
Albumin: 5.1 g/dL — ABNORMAL HIGH (ref 3.5–5.0)
Alkaline Phosphatase: 134 U/L — ABNORMAL HIGH (ref 38–126)
Anion gap: 14 (ref 5–15)
BUN: 19 mg/dL (ref 6–20)
CO2: 24 mmol/L (ref 22–32)
Calcium: 10.6 mg/dL — ABNORMAL HIGH (ref 8.9–10.3)
Chloride: 98 mmol/L (ref 98–111)
Creatinine, Ser: 0.79 mg/dL (ref 0.44–1.00)
GFR, Estimated: >60 mL/min (ref >60)
Glucose, Bld: 176 mg/dL — ABNORMAL HIGH (ref 70–99)
Potassium: 4.5 mmol/L (ref 3.5–5.1)
Sodium: 136 mmol/L (ref 135–145)
Total Bilirubin: 0.9 mg/dL (ref 0.3–1.2)
Total Protein: 8.5 g/dL — ABNORMAL HIGH (ref 6.5–8.1)

## 2020-12-23 LAB — URINALYSIS, ROUTINE W REFLEX MICROSCOPIC
Bilirubin Urine: NEGATIVE
Glucose, UA: NEGATIVE mg/dL
Hgb urine dipstick: NEGATIVE
Ketones, ur: 20 mg/dL — AB
Leukocytes,Ua: NEGATIVE
Nitrite: NEGATIVE
Protein, ur: NEGATIVE mg/dL
Specific Gravity, Urine: <1.005 — ABNORMAL LOW (ref 1.005–1.030)
pH: 5 (ref 5.0–8.0)

## 2020-12-23 LAB — HCG, QUANTITATIVE, PREGNANCY: hCG, Beta Chain, Quant, S: 3 m[IU]/mL (ref <5)

## 2020-12-23 MED ORDER — ONDANSETRON HCL 4 MG/2ML IJ SOLN
4.0000 mg | Freq: Once | INTRAMUSCULAR | Status: AC
  Administered 2020-12-23: 08:00:00 4 mg via INTRAVENOUS
  Filled 2020-12-23: qty 2

## 2020-12-23 MED ORDER — IOHEXOL 350 MG/ML SOLN
80.0000 mL | Freq: Once | INTRAVENOUS | Status: AC | PRN
  Administered 2020-12-23: 07:00:00 80 mL via INTRAVENOUS

## 2020-12-23 MED ORDER — SODIUM CHLORIDE 0.9 % IV BOLUS
1000.0000 mL | Freq: Once | INTRAVENOUS | Status: AC
  Administered 2020-12-23: 08:00:00 1000 mL via INTRAVENOUS

## 2020-12-23 MED ORDER — MORPHINE SULFATE (PF) 4 MG/ML IV SOLN
4.0000 mg | Freq: Once | INTRAVENOUS | Status: AC
  Administered 2020-12-23: 08:00:00 4 mg via INTRAVENOUS
  Filled 2020-12-23: qty 1

## 2020-12-23 MED ORDER — ONDANSETRON HCL 4 MG PO TABS: 4.0000 mg | ORAL_TABLET | Freq: Three times a day (TID) | ORAL | 0 refills | Status: DC | PRN

## 2020-12-23 NOTE — ED Provider Notes (Signed)
[facility] DEPT Provider Note   CSN: [id number] Arrival date & time: 12/23/20  0253     History Chief Complaint  Patient presents with   Abdominal Pain   Nausea   Emesis    Sue Benson is a 45 y.o. female.  The history is provided by the patient. No language interpreter was used.  Abdominal Pain Associated symptoms: vomiting   Emesis Associated symptoms: abdominal pain    45 year old female significant history of cervical cancer, SBO, thyroid disease, chronic radiation cystitis presenting complaining of abdominal pain.  Patient reports since 2 PM yesterday she has had recurrent abdominal cramping.  Pain is across abdomen, comes in waves, follows with bouts of nausea, vomiting, and feeling dehydrated.  This morning she did produce some loose stools.  Symptoms felt somewhat similar to prior SBO however she has noticed some gurgling of her abdomen.  She rates the pain as 5 out of 10.  She denies fever but does endorse some chills when the pain is intense.  No runny nose sneezing coughing no chest pain or shortness of breath no dysuria.  Past Medical History:  Diagnosis Date   Chronic constipation    SECONDARY TO RADIATION   Depression    Dyspareunia    Frequency of urination    History of cervical cancer ONCOLOGIST--  [place]   DX 2010--  S/P RADIATION (EXTERNAL AND BRACHY) AND CHEMO SPRING 2010--  NO RECURRENCE   History of concussion    NO RESIDUAL   History of kidney stones    Mild acid reflux    Nerve pain    legs   Nocturia    Radiation cystitis    Wears glasses     Patient Active Problem List   Diagnosis Date Noted   Thyroid nodule 11/27/2014   Hashimoto's thyroiditis 11/27/2014   Adrenal insufficiency ([place]) 06/30/2014   Anemia of chronic disease 06/30/2014   Hypotension 06/29/2014   Partial small bowel obstruction ([place]) 06/28/2014   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   SBO (small bowel obstruction) ([place]) 06/15/2013    Nausea 06/15/2013   Abdominal pain 06/15/2013   Leukocytosis 06/15/2013   Cervix cancer ([place]) 03/24/2013   Pain in joint, pelvic region and thigh 03/24/2013   Chronic radiation cystitis 03/24/2013    Past Surgical History:  Procedure Laterality Date   CERVICAL CONE BIOPSY  FALL 2009   CYSTO/ BILATERAL URETEROSCOPIC STONE EXTRACTIONS AND STENT PLACEMENT  FALL 2013   CYSTOSCOPY WITH BIOPSY N/A 02/03/2013   Procedure: CYSTOSCOPY WITH BIOPSY, CAUTERIZATION OF THE BIOPSY SITES INSTILLATION OF PYRIDIUM  AND RECTAL EXAM UNDER ANESTHESIA;  Surgeon: Ailene Rud, MD;  Location: [facility];  Service: Urology;  Laterality: N/A;   EXTRACORPOREAL SHOCK WAVE LITHOTRIPSY  SUMMER 2013   TONSILLECTOMY   FALL 2010-- LEFT SIDE   2007--- RIGHT SIDE  AND REMOVAL BENIGN CYST     OB History   No obstetric history on file.     Family History  Problem Relation Age of Onset   Diabetes Mother    Fibromyalgia Mother    Myasthenia gravis Father     Social History   Tobacco Use   Smoking status: Former    Packs/day: 0.50    Years: 20.00    Pack years: 10.00    Types: Cigarettes   Smokeless tobacco: Never   Tobacco comments:    CURRENTLY USES ECIG:  less than a cartridge a day  Substance Use Topics   Alcohol use:  No    Alcohol/week: 0.0 standard drinks   Drug use: No    Home Medications Prior to Admission medications   Medication Sig Start Date End Date Taking? Authorizing Provider  Amphetamine-Dextroamphetamine (ADDERALL PO) Take 10 mg by mouth daily as needed (focus).     [provider]  diazepam (VALIUM) 10 MG tablet 10 mg. Vaginally at night    [provider]  doxylamine, Sleep, (UNISOM) 25 MG tablet Take 25 mg by mouth at bedtime as needed.    [provider]  DULoxetine (CYMBALTA) 30 MG capsule Take 20 mg by mouth 5 (five) times daily. Takes 73m in am and 423min pm, updated 5/22    [provider]  estradiol (VIVELLE-DOT)  0.05 MG/24HR patch Place 2 patches onto the skin 2 (two) times a week.    [provider]  fentaNYL (DURAGESIC - DOSED MCG/HR) 25 MCG/HR patch PLACE ONE PATCH ON THE SKIN EVERY THIRD DAY. [facility] 06/10/17   [provider]  Gabapentin Enacarbil (HORIZANT) 600 MG TBCR 600 mg. Take 600 mg at dinner    [provider]  hydrocortisone (CORTEF) 5 MG tablet Take 10 mg in a.m. and 5 mg in p.m. 11/02/18   GhPhilemon KingdomMD  ibuprofen (ADVIL,MOTRIN) 200 MG tablet Take 200 mg by mouth every 6 (six) hours as needed.    [provider]  L-Methylfolate (DEPLIN) 15 MG TABS Take 1 tablet by mouth daily.     [provider]  lamoTRIgine (LAMICTAL) 25 MG tablet Take 250 mg by mouth daily.     [provider]  levothyroxine (SYNTHROID) 25 MCG tablet Take 1 tablet every day before breakfast. NEED APPOINTMENT FOR FURTHER REFILLS 08/19/18   GhPhilemon KingdomMD  Methen-Hyosc-Meth Blue-Na Phos (UROGESIC-BLUE) 81.6 MG TABS Take 1 tablet by mouth 2 (two) times daily. Patient stated 5/22 she takes this med bid, not tid prn    [provider]  ondansetron (ZOFRAN) 4 MG tablet Take 1 tablet (4 mg total) by mouth every 8 (eight) hours as needed for nausea or vomiting. 06/30/14   ShJanece CanterburyMD  oxyCODONE (OXY IR/ROXICODONE) 5 MG immediate release tablet Take 5 mg by mouth every 6 (six) hours as needed for moderate pain.  06/02/13   [provider]  Probiotic Product (PROBIOTIC ADVANCED PO) Take 2 capsules by mouth daily.    [provider]  progesterone (PROMETRIUM) 100 MG capsule Take 100 mg by mouth daily.    [provider]    Allergies    Penicillins, Prilosec [omeprazole], and Ceclor [cefaclor]  Review of Systems   Review of Systems  Gastrointestinal:  Positive for abdominal pain and vomiting.  All other systems reviewed and are negative.  Physical Exam Updated Vital Signs BP 123/90 (BP Location: Left Arm)    Pulse 83   Temp (!) 97.4 F (36.3 C) (Oral)   Resp 18   Ht '5\' 3"'  (1.6 m)   Wt 54.4 kg   SpO2 100%   BMI 21.26 kg/m   Physical Exam Vitals and nursing note reviewed.  Constitutional:      General: She is not in acute distress.    Appearance: She is well-developed.  HENT:     Head: Atraumatic.  Eyes:     Conjunctiva/sclera: Conjunctivae normal.  Cardiovascular:     Rate and Rhythm: Normal rate and regular rhythm.  Pulmonary:     Effort: Pulmonary effort is normal.  Abdominal:  General: Abdomen is flat. Bowel sounds are decreased.     Palpations: Abdomen is soft.     Tenderness: There is no abdominal tenderness.  Musculoskeletal:     Cervical back: Neck supple.  Skin:    Findings: No rash.  Neurological:     Mental Status: She is alert. Mental status is at baseline.  Psychiatric:        Mood and Affect: Mood normal.    ED Results / Procedures / Treatments   Labs (all labs ordered are listed, but only abnormal results are displayed) Labs Reviewed  CBC WITH DIFFERENTIAL/PLATELET - Abnormal; Notable for the following components:      Result Value   WBC 17.1 (*)    Hemoglobin 15.5 (*)    HCT 47.6 (*)    Platelets 488 (*)    Neutro Abs 15.6 (*)    Lymphs Abs 0.6 (*)    Abs Immature Granulocytes 0.09 (*)    All other components within normal limits  COMPREHENSIVE METABOLIC PANEL - Abnormal; Notable for the following components:   Glucose, Bld 176 (*)    Calcium 10.6 (*)    Total Protein 8.5 (*)    Albumin 5.1 (*)    AST 64 (*)    ALT 74 (*)    Alkaline Phosphatase 134 (*)    All other components within normal limits  URINALYSIS, ROUTINE W REFLEX MICROSCOPIC - Abnormal; Notable for the following components:   Specific Gravity, Urine <1.005 (*)    Ketones, ur 20 (*)    All other components within normal limits  HCG, QUANTITATIVE, PREGNANCY    EKG None  Radiology CT ABDOMEN PELVIS W CONTRAST  Result Date: 12/23/2020 CLINICAL DATA:  Abdominal pain,  nausea, vomiting EXAM: CT ABDOMEN AND PELVIS WITH CONTRAST TECHNIQUE: Multidetector CT imaging of the abdomen and pelvis was performed using the standard protocol following bolus administration of intravenous contrast. CONTRAST:  31m OMNIPAQUE IOHEXOL 350 MG/ML SOLN COMPARISON:  08/24/2016 FINDINGS: Lower chest: No pleural or pericardial effusion. Hepatobiliary: No focal liver abnormality is seen. Status post cholecystectomy. No biliary dilatation. Pancreas: Unremarkable. No pancreatic ductal dilatation or surrounding inflammatory changes. Spleen: Normal in size without focal abnormality. Adrenals/Urinary Tract: Adrenal glands are unremarkable. Kidneys are normal, without renal calculi, focal lesion, or hydronephrosis. Bladder is unremarkable. Stomach/Bowel: The stomach is nondistended. The small bowel is nondilated. Mild circumferential wall thickening and enhancement in several loops in the right lower quadrant. No evidence of perforation or abscess. The terminal ileum is unremarkable. Appendix not identified. The colon is nondilated, unremarkable. Vascular/Lymphatic: Minimal aortic calcified plaque. Retroaortic left renal vein, an anatomic variant. Portal vein patent. No abdominal or pelvic adenopathy. Reproductive: Fiducial markers in the region of the cervix. Uterus unremarkable. No adnexal mass. Other: No ascites.  No free air. Musculoskeletal: Degenerative disc disease L3-4, L4-5. No fracture or worrisome bone lesion. IMPRESSION: 1. Mild circumferential wall thickening in several distal small bowel loops without obstruction, consistent with enteritis of indeterminate etiology. Electronically Signed   By: DLucrezia EuropeM.D.   On: 12/23/2020 08:15    Procedures Procedures   Medications Ordered in ED Medications  iohexol (OMNIPAQUE) 350 MG/ML injection 80 mL (80 mLs Intravenous Contrast Given 12/23/20 0715)    ED Course  I have reviewed the triage vital signs and the nursing notes.  Pertinent labs &  imaging results that were available during my care of the patient were reviewed by me and considered in my medical decision making (see chart for details).  MDM Rules/Calculators/A&P                           BP 112/70   Pulse 85   Temp (!) 97.4 F (36.3 C) (Oral)   Resp 18   Ht '5\' 3"'  (1.6 m)   Wt 54.4 kg   SpO2 100%   BMI 21.26 kg/m   Final Clinical Impression(s) / ED Diagnoses Final diagnoses:  Enteritis    Rx / DC Orders ED Discharge Orders          Ordered    ondansetron (ZOFRAN) 4 MG tablet  Every 8 hours PRN        12/23/20 1027           7:43 AM Patient with history of SBO secondary to radiation therapy previously for cervical cancer.  She is here with abdominal pain associate nausea vomiting and having loose stool.  Her labs remarkable for elevated white count of 17, mild transaminitis with AST 64, ALT 74, alk phos 134.  Pregnancy test is negative.  CT scan of the abdomen pelvis is currently pending.  Will provide IV fluid, antinausea medication and pain management.  8:39 AM CT scan of the abdomen pelvis demonstrate mild circumferential wall thickening and several distal small bowel loops without obstruction, consistent with enteritis of indeterminate etiology.  Fortunately no evidence of SBO.  Patient has a benign abdominal exam.  She is receiving IV fluid and symptom control.   Domenic Moras, PA-C 12/25/20 1952    Valarie Merino, MD 12/29/20 2256

## 2020-12-23 NOTE — ED Notes (Signed)
During my observation with the pt she was able to eat and drink without any problem. However the patient did ask for the request to comeback in 20 minutes. PA and both Nurse has been notify.

## 2020-12-23 NOTE — ED Triage Notes (Signed)
Pt reports with abdominal pain, nausea, and vomiting since last night. Pt states that she has a hx of obstructions.

## 2021-10-25 ENCOUNTER — Emergency Department (HOSPITAL_BASED_OUTPATIENT_CLINIC_OR_DEPARTMENT_OTHER): Payer: BC Managed Care – PPO

## 2021-10-25 ENCOUNTER — Encounter (HOSPITAL_COMMUNITY): Payer: Self-pay

## 2021-10-25 ENCOUNTER — Inpatient Hospital Stay (HOSPITAL_BASED_OUTPATIENT_CLINIC_OR_DEPARTMENT_OTHER)
Admission: EM | Admit: 2021-10-25 | Discharge: 2021-10-28 | DRG: 389 | Disposition: A | Discharge status: Home / Self Care | Payer: BC Managed Care – PPO | Attending: Student | Admitting: Student

## 2021-10-25 ENCOUNTER — Encounter (HOSPITAL_BASED_OUTPATIENT_CLINIC_OR_DEPARTMENT_OTHER): Payer: Self-pay | Admitting: Emergency Medicine

## 2021-10-25 DIAGNOSIS — K5909 Other constipation: Secondary | ICD-10-CM | POA: Diagnosis not present

## 2021-10-25 DIAGNOSIS — Z87442 Personal history of urinary calculi: Secondary | ICD-10-CM

## 2021-10-25 DIAGNOSIS — Z880 Allergy status to penicillin: Secondary | ICD-10-CM

## 2021-10-25 DIAGNOSIS — E039 Hypothyroidism, unspecified: Secondary | ICD-10-CM | POA: Diagnosis present

## 2021-10-25 DIAGNOSIS — Y842 Radiological procedure and radiotherapy as the cause of abnormal reaction of the patient, or of later complication, without mention of misadventure at the time of the procedure: Secondary | ICD-10-CM | POA: Diagnosis not present

## 2021-10-25 DIAGNOSIS — K520 Gastroenteritis and colitis due to radiation: Secondary | ICD-10-CM | POA: Diagnosis not present

## 2021-10-25 DIAGNOSIS — F32A Depression, unspecified: Secondary | ICD-10-CM | POA: Diagnosis present

## 2021-10-25 DIAGNOSIS — Z923 Personal history of irradiation: Secondary | ICD-10-CM | POA: Diagnosis not present

## 2021-10-25 DIAGNOSIS — Z79899 Other long term (current) drug therapy: Secondary | ICD-10-CM | POA: Diagnosis not present

## 2021-10-25 DIAGNOSIS — G8929 Other chronic pain: Secondary | ICD-10-CM | POA: Diagnosis present

## 2021-10-25 DIAGNOSIS — Z87820 Personal history of traumatic brain injury: Secondary | ICD-10-CM | POA: Diagnosis not present

## 2021-10-25 DIAGNOSIS — D649 Anemia, unspecified: Secondary | ICD-10-CM

## 2021-10-25 DIAGNOSIS — E063 Autoimmune thyroiditis: Secondary | ICD-10-CM | POA: Diagnosis not present

## 2021-10-25 DIAGNOSIS — Z9221 Personal history of antineoplastic chemotherapy: Secondary | ICD-10-CM | POA: Diagnosis not present

## 2021-10-25 DIAGNOSIS — Z881 Allergy status to other antibiotic agents status: Secondary | ICD-10-CM

## 2021-10-25 DIAGNOSIS — F17210 Nicotine dependence, cigarettes, uncomplicated: Secondary | ICD-10-CM | POA: Diagnosis not present

## 2021-10-25 DIAGNOSIS — F419 Anxiety disorder, unspecified: Secondary | ICD-10-CM | POA: Diagnosis not present

## 2021-10-25 DIAGNOSIS — Z8541 Personal history of malignant neoplasm of cervix uteri: Secondary | ICD-10-CM | POA: Diagnosis not present

## 2021-10-25 DIAGNOSIS — Z888 Allergy status to other drugs, medicaments and biological substances status: Secondary | ICD-10-CM

## 2021-10-25 DIAGNOSIS — K56609 Unspecified intestinal obstruction, unspecified as to partial versus complete obstruction: Principal | ICD-10-CM | POA: Diagnosis present

## 2021-10-25 DIAGNOSIS — Z79890 Hormone replacement therapy (postmenopausal): Secondary | ICD-10-CM | POA: Diagnosis not present

## 2021-10-25 DIAGNOSIS — E876 Hypokalemia: Secondary | ICD-10-CM | POA: Diagnosis present

## 2021-10-25 HISTORY — DX: Malignant (primary) neoplasm, unspecified: C80.1

## 2021-10-25 HISTORY — DX: Disorder of thyroid, unspecified: E07.9

## 2021-10-25 HISTORY — DX: Unspecified intestinal obstruction, unspecified as to partial versus complete obstruction: K56.609

## 2021-10-25 LAB — URINALYSIS, ROUTINE W REFLEX MICROSCOPIC
Bilirubin Urine: NEGATIVE
Glucose, UA: NEGATIVE mg/dL
Hgb urine dipstick: NEGATIVE
Ketones, ur: >80 mg/dL — AB
Leukocytes,Ua: NEGATIVE
Nitrite: NEGATIVE
Protein, ur: 30 mg/dL — AB
Specific Gravity, Urine: 1.03 (ref 1.005–1.030)
pH: 6.5 (ref 5.0–8.0)

## 2021-10-25 LAB — COMPREHENSIVE METABOLIC PANEL
ALT: 12 U/L (ref 0–44)
AST: 15 U/L (ref 15–41)
Albumin: 5 g/dL (ref 3.5–5.0)
Alkaline Phosphatase: 50 U/L (ref 38–126)
Anion gap: 16 — ABNORMAL HIGH (ref 5–15)
BUN: 18 mg/dL (ref 6–20)
CO2: 22 mmol/L (ref 22–32)
Calcium: 10.3 mg/dL (ref 8.9–10.3)
Chloride: 101 mmol/L (ref 98–111)
Creatinine, Ser: 0.82 mg/dL (ref 0.44–1.00)
GFR, Estimated: >60 mL/min (ref >60)
Glucose, Bld: 123 mg/dL — ABNORMAL HIGH (ref 70–99)
Potassium: 4 mmol/L (ref 3.5–5.1)
Sodium: 139 mmol/L (ref 135–145)
Total Bilirubin: 0.5 mg/dL (ref 0.3–1.2)
Total Protein: 7.5 g/dL (ref 6.5–8.1)

## 2021-10-25 LAB — LIPASE, BLOOD: Lipase: 12 U/L (ref 11–51)

## 2021-10-25 LAB — CBC WITH DIFFERENTIAL/PLATELET
Abs Immature Granulocytes: 0.03 10*3/uL (ref 0.00–0.07)
Basophils Absolute: 0.1 10*3/uL (ref 0.0–0.1)
Basophils Relative: 1 %
Eosinophils Absolute: 0 10*3/uL (ref 0.0–0.5)
Eosinophils Relative: 0 %
HCT: 43.3 % (ref 36.0–46.0)
Hemoglobin: 14.6 g/dL (ref 12.0–15.0)
Immature Granulocytes: 0 %
Lymphocytes Relative: 6 %
Lymphs Abs: 0.7 10*3/uL (ref 0.7–4.0)
MCH: 30.8 pg (ref 26.0–34.0)
MCHC: 33.7 g/dL (ref 30.0–36.0)
MCV: 91.4 fL (ref 80.0–100.0)
Monocytes Absolute: 0.5 10*3/uL (ref 0.1–1.0)
Monocytes Relative: 4 %
Neutro Abs: 9.6 10*3/uL — ABNORMAL HIGH (ref 1.7–7.7)
Neutrophils Relative %: 89 %
Platelets: 428 10*3/uL — ABNORMAL HIGH (ref 150–400)
RBC: 4.74 MIL/uL (ref 3.87–5.11)
RDW: 13.3 % (ref 11.5–15.5)
WBC: 10.8 10*3/uL — ABNORMAL HIGH (ref 4.0–10.5)
nRBC: 0 % (ref 0.0–0.2)

## 2021-10-25 MED ORDER — ONDANSETRON 4 MG PO TBDP
4.0000 mg | ORAL_TABLET | Freq: Once | ORAL | Status: AC
  Administered 2021-10-25: 4 mg via ORAL
  Filled 2021-10-25: qty 1

## 2021-10-25 MED ORDER — SODIUM CHLORIDE 0.9 % IV BOLUS
1000.0000 mL | Freq: Once | INTRAVENOUS | Status: AC
  Administered 2021-10-25: 1000 mL via INTRAVENOUS

## 2021-10-25 MED ORDER — LIDOCAINE VISCOUS HCL 2 % MT SOLN
15.0000 mL | Freq: Once | OROMUCOSAL | Status: AC
  Administered 2021-10-25: 15 mL via OROMUCOSAL
  Filled 2021-10-25: qty 15

## 2021-10-25 MED ORDER — HYDROMORPHONE HCL 1 MG/ML IJ SOLN
0.5000 mg | INTRAMUSCULAR | Status: DC | PRN
  Administered 2021-10-25: 0.5 mg via INTRAVENOUS
  Filled 2021-10-25: qty 1

## 2021-10-25 MED ORDER — ONDANSETRON HCL 4 MG/2ML IJ SOLN
4.0000 mg | Freq: Once | INTRAMUSCULAR | Status: AC
  Administered 2021-10-25: 4 mg via INTRAVENOUS
  Filled 2021-10-25: qty 2

## 2021-10-25 MED ORDER — IOHEXOL 300 MG/ML  SOLN
100.0000 mL | Freq: Once | INTRAMUSCULAR | Status: AC | PRN
  Administered 2021-10-25: 70 mL via INTRAVENOUS

## 2021-10-25 MED ORDER — HYDROMORPHONE HCL 1 MG/ML IJ SOLN
0.5000 mg | Freq: Once | INTRAMUSCULAR | Status: AC
  Administered 2021-10-25: 0.5 mg via INTRAVENOUS
  Filled 2021-10-25: qty 1

## 2021-10-25 MED ORDER — LIDOCAINE HCL URETHRAL/MUCOSAL 2 % EX GEL
1.0000 | Freq: Once | CUTANEOUS | Status: AC
  Administered 2021-10-25: 1 via TOPICAL
  Filled 2021-10-25: qty 11

## 2021-10-25 NOTE — ED Notes (Signed)
[facility] called for general surgery consult.   Spoke with [employer].

## 2021-10-25 NOTE — ED Provider Notes (Signed)
[facility] EMERGENCY DEPT Provider Note   CSN: [id number] Arrival date & time: 10/25/21  1622     History  Chief Complaint  Patient presents with   Abdominal Pain    Sue Benson is a 46 y.o. female presents emergency department chief complaint of abdominal pain and vomiting.  She has a history of cervical cancer and has had multiple bouts of recurrent episodes of small bowel obstruction.  She feels that she has the same today.  She complains of abdominal distention, pain, multiple episodes of vomiting.  She is not passing gas or making bowel movements for the past 24 hours.She denies fevers or chills   Abdominal Pain      Home Medications Prior to Admission medications   Medication Sig Start Date End Date Taking? Authorizing Provider  acetaminophen (TYLENOL) 500 MG tablet Take 500 mg by mouth every 6 (six) hours as needed for mild pain or headache.    [provider]  Amphetamine-Dextroamphetamine (ADDERALL PO) Take 10 mg by mouth daily as needed (focus).     [provider]  buPROPion (WELLBUTRIN XL) 300 MG 24 hr tablet Take 300 mg by mouth every morning. 11/19/20   [provider]  doxycycline (VIBRAMYCIN) 100 MG capsule Take 100 mg by mouth every 12 (twelve) hours. For 7 days 12/16/20   [provider]  doxylamine, Sleep, (UNISOM) 25 MG tablet Take 25 mg by mouth at bedtime as needed for sleep.    [provider]  DULoxetine (CYMBALTA) 20 MG capsule Take 40 mg by mouth daily. 12/11/20   [provider]  estradiol (VIVELLE-DOT) 0.05 MG/24HR patch Place 1 patch onto the skin 2 (two) times a week.    [provider]  ibuprofen (ADVIL,MOTRIN) 200 MG tablet Take 200 mg by mouth every 6 (six) hours as needed for headache or mild pain.    [provider]  lamoTRIgine (LAMICTAL) 100 MG tablet Take 100 mg by mouth daily. 11/07/20   [provider]  levothyroxine (SYNTHROID) 50 MCG tablet  Take 50 mcg by mouth every morning. 11/26/20   [provider]  naloxone [facility]) nasal spray 4 mg/0.1 mL Place 4 mg into the nose once as needed for opioid reversal. 11/15/18   [provider]  ondansetron (ZOFRAN) 4 MG tablet Take 1 tablet (4 mg total) by mouth every 8 (eight) hours as needed for nausea or vomiting. 12/23/20   Domenic Moras, PA-C  oxymetazoline (AFRIN) 0.05 % nasal spray Place 1 spray into both nostrils at bedtime as needed for congestion.    [provider]      Allergies    Penicillins, Prilosec [omeprazole], and Ceclor [cefaclor]    Review of Systems   Review of Systems  Gastrointestinal:  Positive for abdominal pain.    Physical Exam Updated Vital Signs BP 113/73 (BP Location: Right Arm)   Pulse (!) 59   Temp 97.7 F (36.5 C) (Oral)   Resp 16   Ht '5\' 3"'$  (1.6 m)   Wt 56.7 kg   SpO2 100%   BMI 22.14 kg/m  Physical Exam Vitals and nursing note reviewed.  Constitutional:      General: She is not in acute distress.    Appearance: She is well-developed. She is not diaphoretic.  HENT:     Head: Normocephalic and atraumatic.     Right Ear: External ear normal.     Left Ear: External ear normal.     Nose: Nose normal.  Mouth/Throat:     Mouth: Mucous membranes are moist.  Eyes:     General: No scleral icterus.    Conjunctiva/sclera: Conjunctivae normal.  Cardiovascular:     Rate and Rhythm: Normal rate and regular rhythm.     Heart sounds: Normal heart sounds. No murmur heard.    No friction rub. No gallop.  Pulmonary:     Effort: Pulmonary effort is normal. No respiratory distress.     Breath sounds: Normal breath sounds.  Abdominal:     General: Bowel sounds are normal. There is distension.     Palpations: Abdomen is soft. There is no mass.     Tenderness: There is abdominal tenderness. There is guarding.  Musculoskeletal:     Cervical back: Normal range of motion.  Skin:    General: Skin is warm and dry.   Neurological:     Mental Status: She is alert and oriented to person, place, and time.  Psychiatric:        Behavior: Behavior normal.     ED Results / Procedures / Treatments   Labs (all labs ordered are listed, but only abnormal results are displayed) Labs Reviewed  CBC WITH DIFFERENTIAL/PLATELET - Abnormal; Notable for the following components:      Result Value   WBC 10.8 (*)    Platelets 428 (*)    Neutro Abs 9.6 (*)    All other components within normal limits  COMPREHENSIVE METABOLIC PANEL - Abnormal; Notable for the following components:   Glucose, Bld 123 (*)    Anion gap 16 (*)    All other components within normal limits  URINALYSIS, ROUTINE W REFLEX MICROSCOPIC - Abnormal; Notable for the following components:   Ketones, ur >80 (*)    Protein, ur 30 (*)    All other components within normal limits  LIPASE, BLOOD    EKG None  Radiology CT ABDOMEN PELVIS W CONTRAST  Result Date: 10/25/2021 CLINICAL DATA:  Nausea, vomiting, abdominal pain, history of multiple bowel obstructions EXAM: CT ABDOMEN AND PELVIS WITH CONTRAST TECHNIQUE: Multidetector CT imaging of the abdomen and pelvis was performed using the standard protocol following bolus administration of intravenous contrast. RADIATION DOSE REDUCTION: This exam was performed according to the departmental dose-optimization program which includes automated exposure control, adjustment of the mA and/or kV according to patient size and/or use of iterative reconstruction technique. CONTRAST:  81m OMNIPAQUE IOHEXOL 300 MG/ML  SOLN COMPARISON:  12/23/2020 FINDINGS: Lower chest: No acute abnormality. Hepatobiliary: No focal liver abnormality is seen. Status post cholecystectomy. No biliary dilatation. Pancreas: Unremarkable. No pancreatic ductal dilatation or surrounding inflammatory changes. Spleen: Normal in size without significant abnormality. Adrenals/Urinary Tract: Adrenal glands are unremarkable. Kidneys are normal,  without renal calculi, solid lesion, or hydronephrosis. Bladder is unremarkable. Stomach/Bowel: Stomach is within normal limits. Appendix is not clearly visualized and may be surgically absent. Multiple loops of fluid-filled, although not overtly distended distal small bowel. Narrowed, tethered appearance of the terminal ileum, similar to prior examination, with a thickened, inflamed appearing loop of distal ileum in the anterior pelvis approximately 10 cm in length, with an abrupt caliber change in the left hemipelvis (series 2, image 60, 57). Vascular/Lymphatic: Aortic atherosclerosis. No enlarged abdominal or pelvic lymph nodes. Reproductive: No mass or other significant abnormality. Other: No abdominal wall hernia or abnormality. No ascites. Musculoskeletal: No acute or significant osseous findings. IMPRESSION: 1. Multiple loops of fluid-filled, although not overtly distended distal small bowel. Narrowed, tethered appearance of the terminal ileum, similar to  prior examination, with a thickened, inflamed appearing loop of distal ileum in the anterior pelvis approximately 10 cm in length, and an abrupt caliber change in the left hemipelvis. 2. Findings suggest partial or developing small bowel obstruction, and general appearance and configuration are suggestive of Crohn's disease with acute inflammation and complicated by ileal stricture. Aortic Atherosclerosis (ICD10-I70.0). Electronically Signed   By: Delanna Ahmadi M.D.   On: 10/25/2021 19:19    Procedures Procedures    Medications Ordered in ED Medications  sodium chloride 0.9 % bolus 1,000 mL (has no administration in time range)  HYDROmorphone (DILAUDID) injection 0.5 mg (has no administration in time range)  ondansetron (ZOFRAN-ODT) disintegrating tablet 4 mg (4 mg Oral Given 10/25/21 1658)  iohexol (OMNIPAQUE) 300 MG/ML solution 100 mL (70 mLs Intravenous Contrast Given 10/25/21 1856)    ED Course/ Medical Decision Making/ A&P                            Medical Decision Making This patient presents to the ED for concern of vomiting and abdominal pain, this involves an extensive number of treatment options, and is a complaint that carries with it a high risk of complications and morbidity.  The differential diagnosis includes The differential diagnosis for generalized abdominal pain includes, but is not limited to AAA, gastroenteritis, appendicitis, Bowel obstruction, Bowel perforation. Gastroparesis, DKA, Hernia, Inflammatory bowel disease, mesenteric ischemia, pancreatitis, peritonitis SBP, volvulus.     Co morbidities that complicate the patient evaluation       hx of cervical cancer and recurrent small bowel small bowel obstructions, history of radiation therapy to the abdomen   Additional history obtained:  Additional history obtained from mother at bedside    Lab Tests:  I Ordered, and personally interpreted labs.  The pertinent results include: Elderly elevated white blood cell count and elevated platelet count likely acute phase reaction, CMP with mildly elevated blood glucose also likely acute phase reaction.  Mildly elevated anion gap likely due to some dehydration.  Ketones positive, no anion gap acidosis.  Urine without evidence of infection.   Imaging Studies ordered:  I ordered imaging studies including CT abdomen pelvis with contrast I independently visualized and interpreted imaging which showed small bowel obstruction with transition point at the ileum I agree with the radiologist interpretation   Cardiac Monitoring:       The patient was maintained on a cardiac monitor.  I personally viewed and interpreted the cardiac monitored which showed an underlying rhythm of: Normal sinus rhythm   Medicines ordered and prescription drug management:  I ordered medication including pain medications and antiemetics for domino pain and bowel obstruction Reevaluation of the patient after these medicines showed that  the patient improved I have reviewed the patients home medicines and have made adjustments as needed      Critical Interventions:       NG tube placed   Consultations Obtained:  I requested consultation with the Dr. Rosendo Gros,  and discussed lab and imaging findings as well as pertinent plan - they recommend: Medical admission, NG tube placement   Problem List / ED Course:       Small bowel obstruction   Reevaluation:  After the interventions noted above, I reevaluated the patient and found that they have :improved   Social Determinants of Health:       Good social support, insured, access to outpatient healthcare   Dispostion:  After consideration of the  diagnostic results and the patients response to treatment, I feel that the patent would benefit from work-up initiated.    Amount and/or Complexity of Data Reviewed Labs: ordered. Radiology: ordered.  Risk Prescription drug management. Decision regarding hospitalization.           Final Clinical Impression(s) / ED Diagnoses Final diagnoses:  None    Rx / DC Orders ED Discharge Orders     None         Margarita Mail, PA-C 10/25/21 2257    Ezequiel Essex, MD 10/25/21 2325

## 2021-10-25 NOTE — ED Triage Notes (Signed)
Abd pain since 10 last night. Hx of multiple bowel obstructions.

## 2021-10-25 NOTE — ED Provider Triage Note (Signed)
Emergency Medicine Provider Triage Evaluation Note  Sue Benson , a 46 y.o. female  was evaluated in triage.  Pt complains of vomiting. Recurrent sbo. Hx cervical cancer. Feels same as previous sbo  Review of Systems  Positive: vomiting Negative: fever  Physical Exam  BP 113/73 (BP Location: Right Arm)   Pulse (!) 59   Temp 97.7 F (36.5 C) (Oral)   Resp 16   Ht '5\' 3"'$  (1.6 m)   Wt 56.7 kg   SpO2 100%   BMI 22.14 kg/m  Gen:   Awake, no distress   Resp:  Normal effort  MSK:   Moves extremities without difficulty  Other:    Medical Decision Making  Medically screening exam initiated at 4:57 PM.  Appropriate orders placed.  Sue Benson was informed that the remainder of the evaluation will be completed by another provider, this initial triage assessment does not replace that evaluation, and the importance of remaining in the ED until their evaluation is complete.  Work up started.   Sue Mail, PA-C 10/25/21 1658

## 2021-10-26 ENCOUNTER — Encounter (HOSPITAL_COMMUNITY): Payer: Self-pay | Admitting: Internal Medicine

## 2021-10-26 ENCOUNTER — Inpatient Hospital Stay (HOSPITAL_COMMUNITY): Payer: BC Managed Care – PPO

## 2021-10-26 ENCOUNTER — Other Ambulatory Visit: Payer: Self-pay

## 2021-10-26 DIAGNOSIS — G8929 Other chronic pain: Secondary | ICD-10-CM | POA: Diagnosis present

## 2021-10-26 DIAGNOSIS — F32A Depression, unspecified: Secondary | ICD-10-CM | POA: Diagnosis present

## 2021-10-26 DIAGNOSIS — F419 Anxiety disorder, unspecified: Secondary | ICD-10-CM | POA: Diagnosis present

## 2021-10-26 DIAGNOSIS — Z881 Allergy status to other antibiotic agents status: Secondary | ICD-10-CM | POA: Diagnosis not present

## 2021-10-26 DIAGNOSIS — Z79899 Other long term (current) drug therapy: Secondary | ICD-10-CM | POA: Diagnosis not present

## 2021-10-26 DIAGNOSIS — K520 Gastroenteritis and colitis due to radiation: Secondary | ICD-10-CM

## 2021-10-26 DIAGNOSIS — K5909 Other constipation: Secondary | ICD-10-CM | POA: Diagnosis present

## 2021-10-26 DIAGNOSIS — E063 Autoimmune thyroiditis: Secondary | ICD-10-CM | POA: Diagnosis present

## 2021-10-26 DIAGNOSIS — E039 Hypothyroidism, unspecified: Secondary | ICD-10-CM | POA: Diagnosis not present

## 2021-10-26 DIAGNOSIS — E876 Hypokalemia: Secondary | ICD-10-CM | POA: Diagnosis present

## 2021-10-26 DIAGNOSIS — Z8541 Personal history of malignant neoplasm of cervix uteri: Secondary | ICD-10-CM | POA: Diagnosis not present

## 2021-10-26 DIAGNOSIS — Y842 Radiological procedure and radiotherapy as the cause of abnormal reaction of the patient, or of later complication, without mention of misadventure at the time of the procedure: Secondary | ICD-10-CM | POA: Diagnosis present

## 2021-10-26 DIAGNOSIS — Z87820 Personal history of traumatic brain injury: Secondary | ICD-10-CM | POA: Diagnosis not present

## 2021-10-26 DIAGNOSIS — Z888 Allergy status to other drugs, medicaments and biological substances status: Secondary | ICD-10-CM | POA: Diagnosis not present

## 2021-10-26 DIAGNOSIS — F17210 Nicotine dependence, cigarettes, uncomplicated: Secondary | ICD-10-CM | POA: Diagnosis present

## 2021-10-26 DIAGNOSIS — Z880 Allergy status to penicillin: Secondary | ICD-10-CM | POA: Diagnosis not present

## 2021-10-26 DIAGNOSIS — Z923 Personal history of irradiation: Secondary | ICD-10-CM | POA: Diagnosis not present

## 2021-10-26 DIAGNOSIS — K56609 Unspecified intestinal obstruction, unspecified as to partial versus complete obstruction: Secondary | ICD-10-CM

## 2021-10-26 DIAGNOSIS — Z87442 Personal history of urinary calculi: Secondary | ICD-10-CM | POA: Diagnosis not present

## 2021-10-26 DIAGNOSIS — Z79890 Hormone replacement therapy (postmenopausal): Secondary | ICD-10-CM | POA: Diagnosis not present

## 2021-10-26 DIAGNOSIS — D649 Anemia, unspecified: Secondary | ICD-10-CM | POA: Diagnosis present

## 2021-10-26 DIAGNOSIS — Z9221 Personal history of antineoplastic chemotherapy: Secondary | ICD-10-CM | POA: Diagnosis not present

## 2021-10-26 LAB — COMPREHENSIVE METABOLIC PANEL
ALT: 16 U/L (ref 0–44)
AST: 15 U/L (ref 15–41)
Albumin: 3.8 g/dL (ref 3.5–5.0)
Alkaline Phosphatase: 43 U/L (ref 38–126)
Anion gap: 4 — ABNORMAL LOW (ref 5–15)
BUN: 16 mg/dL (ref 6–20)
CO2: 29 mmol/L (ref 22–32)
Calcium: 8.8 mg/dL — ABNORMAL LOW (ref 8.9–10.3)
Chloride: 108 mmol/L (ref 98–111)
Creatinine, Ser: 0.6 mg/dL (ref 0.44–1.00)
GFR, Estimated: >60 mL/min (ref >60)
Glucose, Bld: 101 mg/dL — ABNORMAL HIGH (ref 70–99)
Potassium: 4.4 mmol/L (ref 3.5–5.1)
Sodium: 141 mmol/L (ref 135–145)
Total Bilirubin: 0.5 mg/dL (ref 0.3–1.2)
Total Protein: 6.3 g/dL — ABNORMAL LOW (ref 6.5–8.1)

## 2021-10-26 LAB — CBC
HCT: 40.3 % (ref 36.0–46.0)
Hemoglobin: 12.4 g/dL (ref 12.0–15.0)
MCH: 30.1 pg (ref 26.0–34.0)
MCHC: 30.8 g/dL (ref 30.0–36.0)
MCV: 97.8 fL (ref 80.0–100.0)
Platelets: 355 10*3/uL (ref 150–400)
RBC: 4.12 MIL/uL (ref 3.87–5.11)
RDW: 13.7 % (ref 11.5–15.5)
WBC: 10.8 10*3/uL — ABNORMAL HIGH (ref 4.0–10.5)
nRBC: 0 % (ref 0.0–0.2)

## 2021-10-26 LAB — PHOSPHORUS: Phosphorus: 3 mg/dL (ref 2.5–4.6)

## 2021-10-26 LAB — MAGNESIUM: Magnesium: 1.9 mg/dL (ref 1.7–2.4)

## 2021-10-26 LAB — C-REACTIVE PROTEIN: CRP: 0.5 mg/dL (ref <1.0)

## 2021-10-26 LAB — SEDIMENTATION RATE: Sed Rate: 5 mm/hr (ref 0–22)

## 2021-10-26 MED ORDER — PHENOL 1.4 % MT LIQD
2.0000 | OROMUCOSAL | Status: DC | PRN
  Administered 2021-10-26 – 2021-10-27 (×2): 2 via OROMUCOSAL
  Filled 2021-10-26: qty 177

## 2021-10-26 MED ORDER — DIATRIZOATE MEGLUMINE & SODIUM 66-10 % PO SOLN
90.0000 mL | Freq: Once | ORAL | Status: AC
  Administered 2021-10-26: 90 mL via NASOGASTRIC
  Filled 2021-10-26: qty 90

## 2021-10-26 MED ORDER — LEVOTHYROXINE SODIUM 100 MCG/5ML IV SOLN: 37.5000 ug | Freq: Every day | INTRAVENOUS | Status: DC

## 2021-10-26 MED ORDER — LACTATED RINGERS IV SOLN: INTRAVENOUS | Status: AC

## 2021-10-26 MED ORDER — FAMOTIDINE IN NACL 20-0.9 MG/50ML-% IV SOLN
20.0000 mg | Freq: Two times a day (BID) | INTRAVENOUS | Status: DC
  Administered 2021-10-26 – 2021-10-27 (×5): 20 mg via INTRAVENOUS
  Filled 2021-10-26 (×6): qty 50

## 2021-10-26 MED ORDER — DIPHENHYDRAMINE HCL 50 MG/ML IJ SOLN
12.5000 mg | Freq: Three times a day (TID) | INTRAMUSCULAR | Status: DC | PRN
  Administered 2021-10-26 – 2021-10-27 (×3): 12.5 mg via INTRAVENOUS
  Filled 2021-10-26 (×3): qty 1

## 2021-10-26 MED ORDER — HEPARIN SODIUM (PORCINE) 5000 UNIT/ML IJ SOLN
5000.0000 [IU] | Freq: Three times a day (TID) | INTRAMUSCULAR | Status: DC
  Administered 2021-10-26 – 2021-10-28 (×7): 5000 [IU] via SUBCUTANEOUS
  Filled 2021-10-26 (×7): qty 1

## 2021-10-26 MED ORDER — HYDROMORPHONE HCL 1 MG/ML IJ SOLN
1.0000 mg | INTRAMUSCULAR | Status: DC | PRN
  Administered 2021-10-26 (×5): 1 mg via INTRAVENOUS
  Filled 2021-10-26 (×6): qty 1

## 2021-10-26 MED ORDER — KETOROLAC TROMETHAMINE 15 MG/ML IJ SOLN
15.0000 mg | Freq: Three times a day (TID) | INTRAMUSCULAR | Status: DC | PRN
  Administered 2021-10-26 – 2021-10-28 (×3): 15 mg via INTRAVENOUS
  Filled 2021-10-26 (×3): qty 1

## 2021-10-26 MED ORDER — ONDANSETRON HCL 4 MG/2ML IJ SOLN
4.0000 mg | Freq: Four times a day (QID) | INTRAMUSCULAR | Status: DC
  Administered 2021-10-26 (×4): 4 mg via INTRAVENOUS
  Filled 2021-10-26 (×5): qty 2

## 2021-10-26 MED ORDER — KETOROLAC TROMETHAMINE 15 MG/ML IJ SOLN
15.0000 mg | Freq: Once | INTRAMUSCULAR | Status: AC
  Administered 2021-10-26: 15 mg via INTRAVENOUS
  Filled 2021-10-26: qty 1

## 2021-10-26 NOTE — Plan of Care (Signed)
  Problem: Education: Goal: Knowledge of General Education information will improve Description: Including pain rating scale, medication(s)/side effects and non-pharmacologic comfort measures Outcome: Progressing   Problem: Health Behavior/Discharge Planning: Goal: Ability to manage health-related needs will improve Outcome: Progressing   Problem: Activity: Goal: Risk for activity intolerance will decrease Outcome: Progressing   

## 2021-10-26 NOTE — Consult Note (Signed)
Reason for Consult:SBO Referring Physician: Dr. Harlow Ohms  Sue Benson is an 46 y.o. female.  HPI: This is a 46 year old female with a history of cervical cancer that was treated with radiation therapy and chemotherapy only.  Since that time, she reports having had multiple small bowel obstructions.  Her last one was approximately a year ago.  They have always resolved with conservative management.  The last was treated in [place].  She has had no prior abdominal surgery.  She reports that her discomfort started after eating asparagus on Friday.  She is always careful with what she eats.  She started having significant nausea and vomiting yesterday.  She presented to the emergency department where a CT scan was performed confirming a small bowel obstruction.  She now has a nasogastric tube in place.  She reports her abdominal pain is improved and she is now comfortable from the nasogastric tube.  Her last bowel movement was prior to arrival.  She is uncertain if she has passed flatus today.  She has no cardiopulmonary issues and is otherwise healthy.  Her pain was moderate and cramping in nature.  Past Medical History:  Diagnosis Date   Cancer ([place])    Chronic constipation    SECONDARY TO RADIATION   Depression    Dyspareunia    Frequency of urination    History of cervical cancer ONCOLOGIST--  [place]   DX 2010--  S/P RADIATION (EXTERNAL AND BRACHY) AND CHEMO SPRING 2010--  NO RECURRENCE   History of concussion    NO RESIDUAL   History of kidney stones    Mild acid reflux    Nerve pain    legs   Nocturia    Radiation cystitis    SBO (small bowel obstruction) ([place])    Thyroid disease    Wears glasses     Past Surgical History:  Procedure Laterality Date   CERVICAL CONE BIOPSY  FALL 2009   CYSTO/ BILATERAL URETEROSCOPIC STONE EXTRACTIONS AND STENT PLACEMENT  FALL 2013   CYSTOSCOPY WITH BIOPSY N/A 02/03/2013   Procedure: CYSTOSCOPY WITH BIOPSY, CAUTERIZATION OF THE  BIOPSY SITES INSTILLATION OF PYRIDIUM  AND RECTAL EXAM UNDER ANESTHESIA;  Surgeon: Ailene Rud, MD;  Location: [facility];  Service: Urology;  Laterality: N/A;   EXTRACORPOREAL SHOCK WAVE LITHOTRIPSY  SUMMER 2013   TONSILLECTOMY   FALL 2010-- LEFT SIDE   2007--- RIGHT SIDE  AND REMOVAL BENIGN CYST    Family History  Problem Relation Age of Onset   Diabetes Mother    Fibromyalgia Mother    Myasthenia gravis Father     Social History:  reports that she has been smoking cigarettes and e-cigarettes. She has a 10.00 pack-year smoking history. She has never used smokeless tobacco. She reports that she does not drink alcohol and does not use drugs.  Allergies:  Allergies  Allergen Reactions   Penicillins Anaphylaxis   Prilosec [Omeprazole] Other (See Comments)    Patient states that it "shut her stomach down too much."   Ceclor [Cefaclor] Rash    Medications: I have reviewed the patient's current medications.  Results for orders placed or performed during the hospital encounter of 10/25/21 (from the past 48 hour(s))  CBC with Differential     Status: Abnormal   Collection Time: 10/25/21  4:57 PM  Result Value Ref Range   WBC 10.8 (H) 4.0 - 10.5 K/uL   RBC 4.74 3.87 - 5.11 MIL/uL   Hemoglobin 14.6 12.0 -  15.0 g/dL   HCT 43.3 36.0 - 46.0 %   MCV 91.4 80.0 - 100.0 fL   MCH 30.8 26.0 - 34.0 pg   MCHC 33.7 30.0 - 36.0 g/dL   RDW 13.3 11.5 - 15.5 %   Platelets 428 (H) 150 - 400 K/uL   nRBC 0.0 0.0 - 0.2 %   Neutrophils Relative % 89 %   Neutro Abs 9.6 (H) 1.7 - 7.7 K/uL   Lymphocytes Relative 6 %   Lymphs Abs 0.7 0.7 - 4.0 K/uL   Monocytes Relative 4 %   Monocytes Absolute 0.5 0.1 - 1.0 K/uL   Eosinophils Relative 0 %   Eosinophils Absolute 0.0 0.0 - 0.5 K/uL   Basophils Relative 1 %   Basophils Absolute 0.1 0.0 - 0.1 K/uL   Immature Granulocytes 0 %   Abs Immature Granulocytes 0.03 0.00 - 0.07 K/uL    Comment: Performed at [facility], [place], [place] [address]  Comprehensive metabolic panel     Status: Abnormal   Collection Time: 10/25/21  4:57 PM  Result Value Ref Range   Sodium 139 135 - 145 mmol/L   Potassium 4.0 3.5 - 5.1 mmol/L   Chloride 101 98 - 111 mmol/L   CO2 22 22 - 32 mmol/L   Glucose, Bld 123 (H) 70 - 99 mg/dL    Comment: Glucose reference range applies only to samples taken after fasting for at least 8 hours.   BUN 18 6 - 20 mg/dL   Creatinine, Ser 0.82 0.44 - 1.00 mg/dL   Calcium 10.3 8.9 - 10.3 mg/dL   Total Protein 7.5 6.5 - 8.1 g/dL   Albumin 5.0 3.5 - 5.0 g/dL   AST 15 15 - 41 U/L   ALT 12 0 - 44 U/L   Alkaline Phosphatase 50 38 - 126 U/L   Total Bilirubin 0.5 0.3 - 1.2 mg/dL   GFR, Estimated >60 >60 mL/min    Comment: (NOTE) Calculated using the CKD-EPI Creatinine Equation (2021)    Anion gap 16 (H) 5 - 15    Comment: Performed at [facility], [place], [place] [address]  Lipase, blood     Status: None   Collection Time: 10/25/21  4:57 PM  Result Value Ref Range   Lipase 12 11 - 51 U/L    Comment: Performed at [facility], [address] [place],  [address]  Urinalysis, Routine w reflex microscopic     Status: Abnormal   Collection Time: 10/25/21  4:57 PM  Result Value Ref Range   Color, Urine YELLOW YELLOW   APPearance CLEAR CLEAR   Specific Gravity, Urine 1.030 1.005 - 1.030   pH 6.5 5.0 - 8.0   Glucose, UA NEGATIVE NEGATIVE mg/dL   Hgb urine dipstick NEGATIVE NEGATIVE   Bilirubin Urine NEGATIVE NEGATIVE   Ketones, ur >80 (A) NEGATIVE mg/dL   Protein, ur 30 (A) NEGATIVE mg/dL   Nitrite NEGATIVE NEGATIVE   Leukocytes,Ua NEGATIVE NEGATIVE   RBC / HPF 11-20 0 - 5 RBC/hpf   WBC, UA 0-5 0 - 5 WBC/hpf   Squamous Epithelial / LPF 0-5 0 - 5   Mucus PRESENT     Comment: Performed at [facility], [place], [place] [address]    DG Abd Portable 1  View  Result Date: 10/25/2021 CLINICAL DATA:  NG tube placement EXAM: PORTABLE ABDOMEN - 1 VIEW COMPARISON:  CT abdomen and pelvis earlier  today FINDINGS: Enteric tube tip and side port in the stomach. Cholecystectomy clips. IMPRESSION: Enteric tube tip and side-port in the stomach. Electronically Signed   By: Placido Sou M.D.   On: 10/25/2021 23:35   CT ABDOMEN PELVIS W CONTRAST  Result Date: 10/25/2021 CLINICAL DATA:  Nausea, vomiting, abdominal pain, history of multiple bowel obstructions EXAM: CT ABDOMEN AND PELVIS WITH CONTRAST TECHNIQUE: Multidetector CT imaging of the abdomen and pelvis was performed using the standard protocol following bolus administration of intravenous contrast. RADIATION DOSE REDUCTION: This exam was performed according to the departmental dose-optimization program which includes automated exposure control, adjustment of the mA and/or kV according to patient size and/or use of iterative reconstruction technique. CONTRAST:  60m OMNIPAQUE IOHEXOL 300 MG/ML  SOLN COMPARISON:  12/23/2020 FINDINGS: Lower chest: No acute abnormality. Hepatobiliary: No focal liver abnormality is seen. Status post cholecystectomy. No biliary dilatation. Pancreas: Unremarkable. No pancreatic ductal dilatation or surrounding inflammatory changes. Spleen: Normal in size without significant abnormality. Adrenals/Urinary Tract: Adrenal glands are unremarkable. Kidneys are normal, without renal calculi, solid lesion, or hydronephrosis. Bladder is unremarkable. Stomach/Bowel: Stomach is within normal limits. Appendix is not clearly visualized and may be surgically absent. Multiple loops of fluid-filled, although not overtly distended distal small bowel. Narrowed, tethered appearance of the terminal ileum, similar to prior examination, with a thickened, inflamed appearing loop of distal ileum in the anterior pelvis approximately 10 cm in length, with an abrupt caliber change in the left hemipelvis (series 2,  image 60, 57). Vascular/Lymphatic: Aortic atherosclerosis. No enlarged abdominal or pelvic lymph nodes. Reproductive: No mass or other significant abnormality. Other: No abdominal wall hernia or abnormality. No ascites. Musculoskeletal: No acute or significant osseous findings. IMPRESSION: 1. Multiple loops of fluid-filled, although not overtly distended distal small bowel. Narrowed, tethered appearance of the terminal ileum, similar to prior examination, with a thickened, inflamed appearing loop of distal ileum in the anterior pelvis approximately 10 cm in length, and an abrupt caliber change in the left hemipelvis. 2. Findings suggest partial or developing small bowel obstruction, and general appearance and configuration are suggestive of Crohn's disease with acute inflammation and complicated by ileal stricture. Aortic Atherosclerosis (ICD10-I70.0). Electronically Signed   By: ADelanna AhmadiM.D.   On: 10/25/2021 19:19    Review of Systems  Constitutional:  Negative for chills and fever.  Respiratory:  Negative for cough and shortness of breath.   Gastrointestinal:  Positive for abdominal pain, nausea and vomiting.  Genitourinary:  Negative for dysuria.  All other systems reviewed and are negative.  Blood pressure 92/61, pulse 61, temperature 98 F (36.7 C), temperature source Oral, resp. rate 18, height '5\' 3"'$  (1.6 m), weight 55.6 kg, SpO2 96 %. Physical Exam Constitutional:      Appearance: She is not diaphoretic.     Comments: She is sitting up and is uncomfortable from the NG tube  HENT:     Head: Normocephalic and atraumatic.  Eyes:     Extraocular Movements: Extraocular movements intact.  Cardiovascular:     Rate and Rhythm: Normal rate and regular rhythm.  Pulmonary:     Effort: Pulmonary effort is normal. No respiratory distress.     Breath sounds: No stridor.  Abdominal:     Comments: Currently, her abdomen is soft with minimal tenderness.  It is nondistended.  There are no  hernias There is no hepatomegaly  Skin:    General: Skin is warm and dry.     Coloration: Skin is not jaundiced.  Neurological:     General: No focal deficit present.     Mental Status: She is alert.  Psychiatric:        Behavior: Behavior normal.     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  We will currently continue nasogastric suctioning and bowel rest and I have ordered the small bowel protocol.  I have discussed the diagnosis with the patient and her husband.  She knows her body well given her multiple bouts of obstruction without the need for surgical intervention.  Hopefully this will likewise improve without the need for any surgery.  After the protocol, we will allow her to have some sips of liquids and popsicles.  We will follow her closely with you.  Moderate medical decision making  Coralie Keens MD 10/26/2021, 9:33 AM

## 2021-10-26 NOTE — Subjective & Objective (Signed)
CC: abd pain, N/V HPI: 46 year old female prior history of cervical cancer treated with high-dose external beam radiation and chemotherapy only (no surgery), history of Hashimoto's thyroiditis, chronic pain presents to the ER with onset of abdominal pain yesterday evening.  She states that she ate some asparagus that she thought was a little too fibrous.  Patient has a history of bowel obstructions.  She states that she has to be very careful with the type and amount of fiber that she eats.  She thinks that the asparagus is was a little too stringy and not cooked well enough.  She started vomiting this morning.  She cannot stop vomiting.  She was brought to the ER for evaluation.  Patient has had about 4-5 bouts of bowel obstruction in the past.  Most recently 2022 where she was treated for at [facility] in [place].  She has never needed laparotomy or lysis of adhesions before.  Labs showed a white count of 10.8, hemoglobin of 14.6, platelets of 425  Lipase of 12  CMP otherwise normal except for glucose of 123  CT scan abdomen pelvis demonstrated multiple fluid-filled loops of bowel with a tethered appearance of the terminal ileum and a thickened inflamed distal ileum in the anterior pelvis.  General surgery was consulted.  NG tube was placed.  [facility] hospitalist contacted for admission.

## 2021-10-26 NOTE — H&P (Signed)
History and Physical    Sue Benson UYQ:[id number] DOB: 19-Oct-1975 DOA: 10/25/2021  DOS: the patient was seen and examined on 10/25/2021  PCP: Curly Shores, MD   Patient coming from: Home  I have personally briefly reviewed patient's old medical records in [place]  CC: abd pain, N/V HPI: 46 year old female prior history of cervical cancer treated with high-dose external beam radiation and chemotherapy only (no surgery), history of Hashimoto's thyroiditis, chronic pain presents to the ER with onset of abdominal pain yesterday evening.  She states that she ate some asparagus that she thought was a little too fibrous.  Patient has a history of bowel obstructions.  She states that she has to be very careful with the type and amount of fiber that she eats.  She thinks that the asparagus is was a little too stringy and not cooked well enough.  She started vomiting this morning.  She cannot stop vomiting.  She was brought to the ER for evaluation.  Patient has had about 4-5 bouts of bowel obstruction in the past.  Most recently 2022 where she was treated for at [facility] in [place].  She has never needed laparotomy or lysis of adhesions before.  Labs showed a white count of 10.8, hemoglobin of 14.6, platelets of 425  Lipase of 12  CMP otherwise normal except for glucose of 123  CT scan abdomen pelvis demonstrated multiple fluid-filled loops of bowel with a tethered appearance of the terminal ileum and a thickened inflamed distal ileum in the anterior pelvis.  General surgery was consulted.  NG tube was placed.  [facility] hospitalist contacted for admission.     ED Course: CT shows SBO near terminal ileum  Review of Systems:  Review of Systems  Constitutional:  Negative for chills and fever.  HENT: Negative.    Eyes: Negative.   Respiratory: Negative.    Cardiovascular: Negative.   Gastrointestinal:  Positive for abdominal pain, nausea and vomiting.  Genitourinary:  Negative.   Musculoskeletal: Negative.   Skin: Negative.   Neurological: Negative.   Endo/Heme/Allergies: Negative.   Psychiatric/Behavioral: Negative.    All other systems reviewed and are negative.   Past Medical History:  Diagnosis Date   Cancer ([place])    Chronic constipation    SECONDARY TO RADIATION   Depression    Dyspareunia    Frequency of urination    History of cervical cancer ONCOLOGIST--  [place]   DX 2010--  S/P RADIATION (EXTERNAL AND BRACHY) AND CHEMO SPRING 2010--  NO RECURRENCE   History of concussion    NO RESIDUAL   History of kidney stones    Mild acid reflux    Nerve pain    legs   Nocturia    Radiation cystitis    SBO (small bowel obstruction) ([place])    Thyroid disease    Wears glasses     Past Surgical History:  Procedure Laterality Date   CERVICAL CONE BIOPSY  FALL 2009   CYSTO/ BILATERAL URETEROSCOPIC STONE EXTRACTIONS AND STENT PLACEMENT  FALL 2013   CYSTOSCOPY WITH BIOPSY N/A 02/03/2013   Procedure: CYSTOSCOPY WITH BIOPSY, CAUTERIZATION OF THE BIOPSY SITES INSTILLATION OF PYRIDIUM  AND RECTAL EXAM UNDER ANESTHESIA;  Surgeon: Ailene Rud, MD;  Location: [facility];  Service: Urology;  Laterality: N/A;   EXTRACORPOREAL SHOCK WAVE LITHOTRIPSY  SUMMER 2013   TONSILLECTOMY   FALL 2010-- LEFT SIDE   2007--- RIGHT SIDE  AND REMOVAL BENIGN CYST  reports that she has been smoking cigarettes and e-cigarettes. She has a 10.00 pack-year smoking history. She has never used smokeless tobacco. She reports that she does not drink alcohol and does not use drugs.  Allergies  Allergen Reactions   Penicillins Anaphylaxis   Prilosec [Omeprazole] Other (See Comments)    Patient states that it "shut her stomach down too much."   Ceclor [Cefaclor] Rash    Family History  Problem Relation Age of Onset   Diabetes Mother    Fibromyalgia Mother    Myasthenia gravis Father     Prior to Admission medications   Medication Sig Start  Date End Date Taking? Authorizing Provider  acetaminophen (TYLENOL) 500 MG tablet Take 500 mg by mouth every 6 (six) hours as needed for mild pain or headache.    [provider]  Amphetamine-Dextroamphetamine (ADDERALL PO) Take 10 mg by mouth daily as needed (focus).     [provider]  buPROPion (WELLBUTRIN XL) 300 MG 24 hr tablet Take 300 mg by mouth every morning. 11/19/20   [provider]  doxycycline (VIBRAMYCIN) 100 MG capsule Take 100 mg by mouth every 12 (twelve) hours. For 7 days 12/16/20   [provider]  doxylamine, Sleep, (UNISOM) 25 MG tablet Take 25 mg by mouth at bedtime as needed for sleep.    [provider]  DULoxetine (CYMBALTA) 20 MG capsule Take 40 mg by mouth daily. 12/11/20   [provider]  estradiol (VIVELLE-DOT) 0.05 MG/24HR patch Place 1 patch onto the skin 2 (two) times a week.    [provider]  ibuprofen (ADVIL,MOTRIN) 200 MG tablet Take 200 mg by mouth every 6 (six) hours as needed for headache or mild pain.    [provider]  lamoTRIgine (LAMICTAL) 100 MG tablet Take 100 mg by mouth daily. 11/07/20   [provider]  levothyroxine (SYNTHROID) 50 MCG tablet Take 50 mcg by mouth every morning. 11/26/20   [provider]  naloxone [facility]) nasal spray 4 mg/0.1 mL Place 4 mg into the nose once as needed for opioid reversal. 11/15/18   [provider]  ondansetron (ZOFRAN) 4 MG tablet Take 1 tablet (4 mg total) by mouth every 8 (eight) hours as needed for nausea or vomiting. 12/23/20   Domenic Moras, PA-C  oxymetazoline (AFRIN) 0.05 % nasal spray Place 1 spray into both nostrils at bedtime as needed for congestion.    [provider]    Physical Exam: Vitals:   10/25/21 1647 10/25/21 2112 10/25/21 2125 10/25/21 2357  BP: 113/73 116/75  129/80  Pulse: (!) 59 65  79  Resp: '16 13  18  '$ Temp: 97.7 F (36.5 C)  98 F (36.7 C) 98.4 F (36.9 C)  TempSrc: Oral   Oral Oral  SpO2: 100% 97%  96%  Weight:      Height:        Physical Exam Vitals and nursing note reviewed.  Constitutional:      General: She is not in acute distress.    Appearance: Normal appearance. She is not ill-appearing, toxic-appearing or diaphoretic.     Comments: thin  HENT:     Head: Normocephalic and atraumatic.     Nose:     Comments: Right nare NG tube present Eyes:     General: No scleral icterus. Cardiovascular:     Rate and Rhythm: Normal rate and regular rhythm.     Pulses: Normal pulses.  Pulmonary:     Effort: Pulmonary  effort is normal. No respiratory distress.     Breath sounds: Normal breath sounds. No wheezing or rales.  Abdominal:     General: Abdomen is flat. Bowel sounds are decreased. There is no distension.     Tenderness: There is no abdominal tenderness. There is no guarding or rebound.  Musculoskeletal:     Right lower leg: No edema.     Left lower leg: No edema.  Skin:    General: Skin is warm and dry.     Capillary Refill: Capillary refill takes less than 2 seconds.  Neurological:     General: No focal deficit present.     Mental Status: She is alert and oriented to person, place, and time.      Labs on Admission: I have personally reviewed following labs and imaging studies  CBC: Recent Labs  Lab 10/25/21 1657  WBC 10.8*  NEUTROABS 9.6*  HGB 14.6  HCT 43.3  MCV 91.4  PLT 782*   Basic Metabolic Panel: Recent Labs  Lab 10/25/21 1657  NA 139  K 4.0  CL 101  CO2 22  GLUCOSE 123*  BUN 18  CREATININE 0.82  CALCIUM 10.3   GFR: Estimated Creatinine Clearance: 69.4 mL/min (by C-G formula based on SCr of 0.82 mg/dL). Liver Function Tests: Recent Labs  Lab 10/25/21 1657  AST 15  ALT 12  ALKPHOS 50  BILITOT 0.5  PROT 7.5  ALBUMIN 5.0   Recent Labs  Lab 10/25/21 1657  LIPASE 12   No results for input(s): "AMMONIA" in the last 168 hours. Coagulation Profile: No results for input(s): "INR", "PROTIME" in the last  168 hours. Cardiac Enzymes: No results for input(s): "CKTOTAL", "CKMB", "CKMBINDEX", "TROPONINI", "TROPONINIHS" in the last 168 hours. BNP (last 3 results) No results for input(s): "PROBNP" in the last 8760 hours. HbA1C: No results for input(s): "HGBA1C" in the last 72 hours. CBG: No results for input(s): "GLUCAP" in the last 168 hours. Lipid Profile: No results for input(s): "CHOL", "HDL", "LDLCALC", "TRIG", "CHOLHDL", "LDLDIRECT" in the last 72 hours. Thyroid Function Tests: No results for input(s): "TSH", "T4TOTAL", "FREET4", "T3FREE", "THYROIDAB" in the last 72 hours. Anemia Panel: No results for input(s): "VITAMINB12", "FOLATE", "FERRITIN", "TIBC", "IRON", "RETICCTPCT" in the last 72 hours. Urine analysis:    Component Value Date/Time   COLORURINE YELLOW 10/25/2021 1657   APPEARANCEUR CLEAR 10/25/2021 1657   LABSPEC 1.030 10/25/2021 1657   PHURINE 6.5 10/25/2021 1657   GLUCOSEU NEGATIVE 10/25/2021 1657   HGBUR NEGATIVE 10/25/2021 1657   [place] 10/25/2021 1657   KETONESUR >80 (A) 10/25/2021 1657   PROTEINUR 30 (A) 10/25/2021 1657   UROBILINOGEN 0.2 06/28/2014 0213   NITRITE NEGATIVE 10/25/2021 1657   LEUKOCYTESUR NEGATIVE 10/25/2021 1657    Radiological Exams on Admission: I have personally reviewed images DG Abd Portable 1 View  Result Date: 10/25/2021 CLINICAL DATA:  NG tube placement EXAM: PORTABLE ABDOMEN - 1 VIEW COMPARISON:  CT abdomen and pelvis earlier today FINDINGS: Enteric tube tip and side port in the stomach. Cholecystectomy clips. IMPRESSION: Enteric tube tip and side-port in the stomach. Electronically Signed   By: Placido Sou M.D.   On: 10/25/2021 23:35   CT ABDOMEN PELVIS W CONTRAST  Result Date: 10/25/2021 CLINICAL DATA:  Nausea, vomiting, abdominal pain, history of multiple bowel obstructions EXAM: CT ABDOMEN AND PELVIS WITH CONTRAST TECHNIQUE: Multidetector CT imaging of the abdomen and pelvis was performed using the standard protocol  following bolus administration of intravenous contrast. RADIATION DOSE REDUCTION: This  exam was performed according to the departmental dose-optimization program which includes automated exposure control, adjustment of the mA and/or kV according to patient size and/or use of iterative reconstruction technique. CONTRAST:  53m OMNIPAQUE IOHEXOL 300 MG/ML  SOLN COMPARISON:  12/23/2020 FINDINGS: Lower chest: No acute abnormality. Hepatobiliary: No focal liver abnormality is seen. Status post cholecystectomy. No biliary dilatation. Pancreas: Unremarkable. No pancreatic ductal dilatation or surrounding inflammatory changes. Spleen: Normal in size without significant abnormality. Adrenals/Urinary Tract: Adrenal glands are unremarkable. Kidneys are normal, without renal calculi, solid lesion, or hydronephrosis. Bladder is unremarkable. Stomach/Bowel: Stomach is within normal limits. Appendix is not clearly visualized and may be surgically absent. Multiple loops of fluid-filled, although not overtly distended distal small bowel. Narrowed, tethered appearance of the terminal ileum, similar to prior examination, with a thickened, inflamed appearing loop of distal ileum in the anterior pelvis approximately 10 cm in length, with an abrupt caliber change in the left hemipelvis (series 2, image 60, 57). Vascular/Lymphatic: Aortic atherosclerosis. No enlarged abdominal or pelvic lymph nodes. Reproductive: No mass or other significant abnormality. Other: No abdominal wall hernia or abnormality. No ascites. Musculoskeletal: No acute or significant osseous findings. IMPRESSION: 1. Multiple loops of fluid-filled, although not overtly distended distal small bowel. Narrowed, tethered appearance of the terminal ileum, similar to prior examination, with a thickened, inflamed appearing loop of distal ileum in the anterior pelvis approximately 10 cm in length, and an abrupt caliber change in the left hemipelvis. 2. Findings suggest partial  or developing small bowel obstruction, and general appearance and configuration are suggestive of Crohn's disease with acute inflammation and complicated by ileal stricture. Aortic Atherosclerosis (ICD10-I70.0). Electronically Signed   By: ADelanna AhmadiM.D.   On: 10/25/2021 19:19    EKG: My personal interpretation of EKG shows: no EKG  Assessment/Plan Principal Problem:   SBO (small bowel obstruction) (HCC) Active Problems:   Radiation enteritis   Hashimoto's thyroiditis    Assessment and Plan: * SBO (small bowel obstruction) ([facility] Admit to observation med/surg bed.  Continue with NG tube to low wall suction.  General surgery consulted by ER provider.  Patient without a history of inflammatory bowel disease.  She has had small bowel obstructions before.  She has a known radiation enteritis of the terminal ileum.  She has had about 4 bouts of small bowel obstructions in the past.  [place]  [place] Dilaudid 1 mg as needed for pain.  Chloraseptic Spray.  Continue IV fluids.  Keep NPO.  No evidence of active infection.  Patient has anaphylaxis to penicillin.  Hold on antibiotics for now.  IV Pepcid.  She has an allergy to Prilosec.  Hashimoto's thyroiditis Start IV Synthroid 50 mcg.  Due to n.p.o. status.  Radiation enteritis Chronic.   DVT prophylaxis: SQ Heparin Code Status: Full Code Family Communication: discussed with pt and husband Joe  Disposition Plan: return home  Consults called: EDP has consulted general surgery [place]  Admission status: Observation, Med-Surg   EKristopher Oppenheim DO [facility] 10/26/2021, 12:41 AM

## 2021-10-26 NOTE — Assessment & Plan Note (Signed)
Admit to observation med/surg bed.  Continue with NG tube to low wall suction.  General surgery consulted by ER provider.  Patient without a history of inflammatory bowel disease.  She has had small bowel obstructions before.  She has a known radiation enteritis of the terminal ileum.  She has had about 4 bouts of small bowel obstructions in the past.  Most recently in 2022 when she was living in [place].  IV Dilaudid 1 mg as needed for pain.  Chloraseptic Spray.  Continue IV fluids.  Keep NPO.  No evidence of active infection.  Patient has anaphylaxis to penicillin.  Hold on antibiotics for now.  IV Pepcid.  She has an allergy to Prilosec.

## 2021-10-26 NOTE — Progress Notes (Signed)
PROGRESS NOTE  Sue Benson IRW:[id number] DOB: 03-19-1975   PCP: Curly Shores, MD  Patient is from: Home   DOA: 10/25/2021 LOS: 0  Chief complaints Chief Complaint  Patient presents with   Abdominal Pain     Brief Narrative / Interim history: 46 year old F with prior history of SBO (never required NG tube or surgery), cervical cancer status postradiation and chemo, radiation enteritis, hypothyroidism, chronic pain, anxiety and depression presenting with abdominal pain and vomiting, and admitted for small bowel obstruction.  CT abdomen and pelvis showed multiple fluid-filled loops of bowel with tethered appearance of the terminal ileum and thickened inflamed distal ileum in the anterior pelvis.  General surgery consulted.  NG tube placed.  Subjective: Seen and examined earlier this morning.  No major events overnight of this morning.  No bowel movements or flatus since hospitalization.  Abdominal pain improved.  She has discomfort from NG tube.  Minimal output in NG tube canister.  SBO protocol initiated by general surgery.  Family at bedside  Objective: Vitals:   10/25/21 2357 10/26/21 0423 10/26/21 0500 10/26/21 0929  BP: 129/80 117/80  92/61  Pulse: 79 80  61  Resp: '18 14  18  ' Temp: 98.4 F (36.9 C) (!) 97.5 F (36.4 C)  98 F (36.7 C)  TempSrc: Oral Oral  Oral  SpO2: 96% 93%  96%  Weight:   55.6 kg   Height:        Examination:  GENERAL: No apparent distress.  Nontoxic. HEENT: MMM.  Vision and hearing grossly intact.  NGT in place NECK: Supple.  No apparent JVD.  RESP:  No IWOB.  Fair aeration bilaterally. CVS:  RRR. Heart sounds normal.  ABD/GI/GU: BS++. Abd soft, NTND.  MSK/EXT:  Moves extremities. No apparent deformity. No edema.  SKIN: no apparent skin lesion or wound NEURO: Awake, alert and oriented appropriately.  No apparent focal neuro deficit. PSYCH: Calm. Normal affect.   Procedures:  NG tube placement  Microbiology summarized: None  Assessment  and plan: Principal Problem:   SBO (small bowel obstruction) (HCC) Active Problems:   Radiation enteritis   Hashimoto's thyroiditis   Acquired hypothyroidism   Anxiety and depression  Small bowel obstruction: Presents with abdominal pain and vomiting. CT abdomen and pelvis showed multiple fluid-filled loops of bowel with tethered appearance of the terminal ileum and thickened inflamed distal ileum in the anterior pelvis.  Patient reports 4-5 bouts of SBO but never required NGT or surgery.  Has history of cervical cancer status postradiation and chemo.  She has radiation enteritis.  No flatus or bowel movement yet. -General surgery following -NG tube and small bowel protocol -N.p.o. and IVF. -Check CRP and ESR.  Hashimoto's thyroiditis/hypothyroidism -On IV Synthroid.  Anxiety and depression -Continue home medicine when feasible -IV Ativan as needed and instead of home diazepam  Chronic pain?  Does not seem to be on pain medication per [facility]. -Resume home Cymbalta when feasible  History of cervical cancer status post radiation and chemotherapy -Outpatient follow-up.  Body mass index is 21.71 kg/m.          DVT prophylaxis:  heparin injection 5,000 Units Start: 10/26/21 0600 SCDs Start: 10/26/21 0049  Code Status: Full code Family Communication: Updated family at bedside Level of care: Med-Surg Status is: Observation The patient will require care spanning > 2 midnights and should be moved to inpatient because: Small bowel obstruction   Final disposition: Likely home once medically ready. Consultants:  General surgery  Sch Meds:  Scheduled Meds:  heparin  5,000 Units Subcutaneous Q8H   [START ON 11/02/2021] levothyroxine  37.5 mcg Intravenous Daily   ondansetron (ZOFRAN) IV  4 mg Intravenous Q6H   Continuous Infusions:  famotidine (PEPCID) IV 20 mg (10/26/21 0918)   lactated ringers 100 mL/hr at 10/26/21 1153   PRN Meds:.diphenhydrAMINE, HYDROmorphone (DILAUDID)  injection, phenol  Antimicrobials: Anti-infectives (From admission, onward)    None        I have personally reviewed the following labs and images: CBC: Recent Labs  Lab 10/25/21 1657 10/26/21 1101  WBC 10.8* 10.8*  NEUTROABS 9.6*  --   HGB 14.6 12.4  HCT 43.3 40.3  MCV 91.4 97.8  PLT 428* 355   BMP &GFR Recent Labs  Lab 10/25/21 1657 10/26/21 1101  NA 139 141  K 4.0 4.4  CL 101 108  CO2 22 29  GLUCOSE 123* 101*  BUN 18 16  CREATININE 0.82 0.60  CALCIUM 10.3 8.8*  MG  --  1.9  PHOS  --  3.0   Estimated Creatinine Clearance: 71.1 mL/min (by C-G formula based on SCr of 0.6 mg/dL). Liver & Pancreas: Recent Labs  Lab 10/25/21 1657 10/26/21 1101  AST 15 15  ALT 12 16  ALKPHOS 50 43  BILITOT 0.5 0.5  PROT 7.5 6.3*  ALBUMIN 5.0 3.8   Recent Labs  Lab 10/25/21 1657  LIPASE 12   No results for input(s): "AMMONIA" in the last 168 hours. Diabetic: No results for input(s): "HGBA1C" in the last 72 hours. No results for input(s): "GLUCAP" in the last 168 hours. Cardiac Enzymes: No results for input(s): "CKTOTAL", "CKMB", "CKMBINDEX", "TROPONINI" in the last 168 hours. No results for input(s): "PROBNP" in the last 8760 hours. Coagulation Profile: No results for input(s): "INR", "PROTIME" in the last 168 hours. Thyroid Function Tests: No results for input(s): "TSH", "T4TOTAL", "FREET4", "T3FREE", "THYROIDAB" in the last 72 hours. Lipid Profile: No results for input(s): "CHOL", "HDL", "LDLCALC", "TRIG", "CHOLHDL", "LDLDIRECT" in the last 72 hours. Anemia Panel: No results for input(s): "VITAMINB12", "FOLATE", "FERRITIN", "TIBC", "IRON", "RETICCTPCT" in the last 72 hours. Urine analysis:    Component Value Date/Time   COLORURINE YELLOW 10/25/2021 1657   APPEARANCEUR CLEAR 10/25/2021 1657   LABSPEC 1.030 10/25/2021 1657   PHURINE 6.5 10/25/2021 1657   GLUCOSEU NEGATIVE 10/25/2021 1657   HGBUR NEGATIVE 10/25/2021 1657   BILIRUBINUR NEGATIVE 10/25/2021  1657   KETONESUR >80 (A) 10/25/2021 1657   PROTEINUR 30 (A) 10/25/2021 1657   UROBILINOGEN 0.2 06/28/2014 0213   NITRITE NEGATIVE 10/25/2021 1657   LEUKOCYTESUR NEGATIVE 10/25/2021 1657   Sepsis Labs: Invalid input(s): "PROCALCITONIN", "LACTICIDVEN"  Microbiology: No results found for this or any previous visit (from the past 240 hour(s)).  Radiology Studies: DG Abd Portable 1 View  Result Date: 10/25/2021 CLINICAL DATA:  NG tube placement EXAM: PORTABLE ABDOMEN - 1 VIEW COMPARISON:  CT abdomen and pelvis earlier today FINDINGS: Enteric tube tip and side port in the stomach. Cholecystectomy clips. IMPRESSION: Enteric tube tip and side-port in the stomach. Electronically Signed   By: Placido Sou M.D.   On: 10/25/2021 23:35   CT ABDOMEN PELVIS W CONTRAST  Result Date: 10/25/2021 CLINICAL DATA:  Nausea, vomiting, abdominal pain, history of multiple bowel obstructions EXAM: CT ABDOMEN AND PELVIS WITH CONTRAST TECHNIQUE: Multidetector CT imaging of the abdomen and pelvis was performed using the standard protocol following bolus administration of intravenous contrast. RADIATION DOSE REDUCTION: This exam was performed according to the departmental dose-optimization program which  includes automated exposure control, adjustment of the mA and/or kV according to patient size and/or use of iterative reconstruction technique. CONTRAST:  66m OMNIPAQUE IOHEXOL 300 MG/ML  SOLN COMPARISON:  12/23/2020 FINDINGS: Lower chest: No acute abnormality. Hepatobiliary: No focal liver abnormality is seen. Status post cholecystectomy. No biliary dilatation. Pancreas: Unremarkable. No pancreatic ductal dilatation or surrounding inflammatory changes. Spleen: Normal in size without significant abnormality. Adrenals/Urinary Tract: Adrenal glands are unremarkable. Kidneys are normal, without renal calculi, solid lesion, or hydronephrosis. Bladder is unremarkable. Stomach/Bowel: Stomach is within normal limits. Appendix is  not clearly visualized and may be surgically absent. Multiple loops of fluid-filled, although not overtly distended distal small bowel. Narrowed, tethered appearance of the terminal ileum, similar to prior examination, with a thickened, inflamed appearing loop of distal ileum in the anterior pelvis approximately 10 cm in length, with an abrupt caliber change in the left hemipelvis (series 2, image 60, 57). Vascular/Lymphatic: Aortic atherosclerosis. No enlarged abdominal or pelvic lymph nodes. Reproductive: No mass or other significant abnormality. Other: No abdominal wall hernia or abnormality. No ascites. Musculoskeletal: No acute or significant osseous findings. IMPRESSION: 1. Multiple loops of fluid-filled, although not overtly distended distal small bowel. Narrowed, tethered appearance of the terminal ileum, similar to prior examination, with a thickened, inflamed appearing loop of distal ileum in the anterior pelvis approximately 10 cm in length, and an abrupt caliber change in the left hemipelvis. 2. Findings suggest partial or developing small bowel obstruction, and general appearance and configuration are suggestive of Crohn's disease with acute inflammation and complicated by ileal stricture. Aortic Atherosclerosis (ICD10-I70.0). Electronically Signed   By: ADelanna AhmadiM.D.   On: 10/25/2021 19:19      Marieclaire Bettenhausen T. GRamah If 7PM-7AM, please contact night-coverage [email or web address] 10/26/2021, 12:25 PM

## 2021-10-26 NOTE — Assessment & Plan Note (Signed)
Chronic. 

## 2021-10-26 NOTE — Assessment & Plan Note (Signed)
Start IV Synthroid 50 mcg.  Due to n.p.o. status.

## 2021-10-27 DIAGNOSIS — E876 Hypokalemia: Secondary | ICD-10-CM

## 2021-10-27 DIAGNOSIS — F419 Anxiety disorder, unspecified: Secondary | ICD-10-CM | POA: Diagnosis not present

## 2021-10-27 DIAGNOSIS — E039 Hypothyroidism, unspecified: Secondary | ICD-10-CM | POA: Diagnosis not present

## 2021-10-27 DIAGNOSIS — E063 Autoimmune thyroiditis: Secondary | ICD-10-CM | POA: Diagnosis not present

## 2021-10-27 DIAGNOSIS — K56609 Unspecified intestinal obstruction, unspecified as to partial versus complete obstruction: Secondary | ICD-10-CM | POA: Diagnosis not present

## 2021-10-27 LAB — CBC
HCT: 45.3 % (ref 36.0–46.0)
Hemoglobin: 13.7 g/dL (ref 12.0–15.0)
MCH: 30.6 pg (ref 26.0–34.0)
MCHC: 30.2 g/dL (ref 30.0–36.0)
MCV: 101.1 fL — ABNORMAL HIGH (ref 80.0–100.0)
Platelets: 257 10*3/uL (ref 150–400)
RBC: 4.48 MIL/uL (ref 3.87–5.11)
RDW: 13.5 % (ref 11.5–15.5)
WBC: 10.3 10*3/uL (ref 4.0–10.5)
nRBC: 0 % (ref 0.0–0.2)

## 2021-10-27 LAB — COMPREHENSIVE METABOLIC PANEL
ALT: 15 U/L (ref 0–44)
AST: 15 U/L (ref 15–41)
Albumin: 4.4 g/dL (ref 3.5–5.0)
Alkaline Phosphatase: 47 U/L (ref 38–126)
Anion gap: 11 (ref 5–15)
BUN: 17 mg/dL (ref 6–20)
CO2: 23 mmol/L (ref 22–32)
Calcium: 8.9 mg/dL (ref 8.9–10.3)
Chloride: 106 mmol/L (ref 98–111)
Creatinine, Ser: 0.8 mg/dL (ref 0.44–1.00)
GFR, Estimated: >60 mL/min (ref >60)
Glucose, Bld: 73 mg/dL (ref 70–99)
Potassium: 3.5 mmol/L (ref 3.5–5.1)
Sodium: 140 mmol/L (ref 135–145)
Total Bilirubin: 0.7 mg/dL (ref 0.3–1.2)
Total Protein: 6.8 g/dL (ref 6.5–8.1)

## 2021-10-27 LAB — HIV ANTIBODY (ROUTINE TESTING W REFLEX): HIV Screen 4th Generation wRfx: NONREACTIVE

## 2021-10-27 LAB — PHOSPHORUS: Phosphorus: 2.3 mg/dL — ABNORMAL LOW (ref 2.5–4.6)

## 2021-10-27 LAB — MAGNESIUM: Magnesium: 2.1 mg/dL (ref 1.7–2.4)

## 2021-10-27 MED ORDER — SODIUM PHOSPHATES 45 MMOLE/15ML IV SOLN: 15.0000 mmol | Freq: Once | INTRAVENOUS | Status: DC

## 2021-10-27 MED ORDER — SERTRALINE HCL 100 MG PO TABS
100.0000 mg | ORAL_TABLET | Freq: Every morning | ORAL | Status: DC
  Administered 2021-10-27 – 2021-10-28 (×2): 100 mg via ORAL
  Filled 2021-10-27 (×2): qty 1

## 2021-10-27 MED ORDER — DIAZEPAM 5 MG PO TABS
10.0000 mg | ORAL_TABLET | Freq: Every evening | ORAL | Status: DC | PRN
  Administered 2021-10-27: 10 mg via ORAL
  Filled 2021-10-27: qty 2

## 2021-10-27 MED ORDER — ESTRADIOL 0.05 MG/24HR TD PTWK: 0.0500 mg | MEDICATED_PATCH | TRANSDERMAL | Status: DC

## 2021-10-27 MED ORDER — SALINE SPRAY 0.65 % NA SOLN
1.0000 | NASAL | Status: DC | PRN
  Filled 2021-10-27: qty 44

## 2021-10-27 MED ORDER — AMPHETAMINE-DEXTROAMPHETAMINE 10 MG PO TABS
10.0000 mg | ORAL_TABLET | Freq: Two times a day (BID) | ORAL | Status: DC
  Filled 2021-10-27: qty 1

## 2021-10-27 MED ORDER — LEVOTHYROXINE SODIUM 50 MCG PO TABS
50.0000 ug | ORAL_TABLET | Freq: Every day | ORAL | Status: DC
  Administered 2021-10-28: 50 ug via ORAL
  Filled 2021-10-27: qty 1

## 2021-10-27 MED ORDER — POTASSIUM PHOSPHATES 15 MMOLE/5ML IV SOLN
15.0000 mmol | Freq: Once | INTRAVENOUS | Status: AC
  Administered 2021-10-27: 15 mmol via INTRAVENOUS
  Filled 2021-10-27: qty 5

## 2021-10-27 MED ORDER — LAMOTRIGINE 25 MG PO TABS
50.0000 mg | ORAL_TABLET | Freq: Every day | ORAL | Status: DC
  Administered 2021-10-27 – 2021-10-28 (×2): 50 mg via ORAL
  Filled 2021-10-27 (×2): qty 2

## 2021-10-27 MED ORDER — LACTATED RINGERS IV SOLN: INTRAVENOUS | Status: AC

## 2021-10-27 MED ORDER — ONDANSETRON HCL 4 MG/2ML IJ SOLN: 4.0000 mg | Freq: Four times a day (QID) | INTRAMUSCULAR | Status: DC | PRN

## 2021-10-27 MED ORDER — LORATADINE 10 MG PO TABS
10.0000 mg | ORAL_TABLET | Freq: Every day | ORAL | Status: DC | PRN
  Administered 2021-10-27: 10 mg via ORAL
  Filled 2021-10-27: qty 1

## 2021-10-27 NOTE — Progress Notes (Signed)
[facility] Progress Note     Subjective: CC-  Feeling much better. Minimal abdominal pain. No n/v. Passing flatus, no BM. 1.1L out from NG last 24 hours. Xray shows contrast in colon and improving small bowel dilation.   Objective: Vital signs in last 24 hours: Temp:  [98 F (36.7 C)-98.8 F (37.1 C)] 98.6 F (37 C) (10/02 0448) Pulse Rate:  [61-78] 67 (10/02 0448) Resp:  [16-18] 18 (10/02 0448) BP: (92-125)/(61-83) 125/83 (10/02 0448) SpO2:  [90 %-100 %] 92 % (10/02 0448) Last BM Date : 10/27/21  Intake/Output from previous day: 10/01 0701 - 10/02 0700 In: 2136.5 [I.V.:2036.5; IV Piggyback:100] Out: 1100 [Emesis/NG output:1100] Intake/Output this shift: No intake/output data recorded.  PE: Gen:  Alert, NAD, pleasant Abd: soft, ND, NT  Lab Results:  Recent Labs    10/26/21 1101 10/27/21 0820  WBC 10.8* 10.3  HGB 12.4 13.7  HCT 40.3 45.3  PLT 355 257   BMET Recent Labs    10/26/21 1101 10/27/21 0820  NA 141 140  K 4.4 3.5  CL 108 106  CO2 29 23  GLUCOSE 101* 73  BUN 16 17  CREATININE 0.60 0.80  CALCIUM 8.8* 8.9   PT/INR No results for input(s): "LABPROT", "INR" in the last 72 hours. CMP     Component Value Date/Time   NA 140 10/27/2021 0820   K 3.5 10/27/2021 0820   CL 106 10/27/2021 0820   CO2 23 10/27/2021 0820   GLUCOSE 73 10/27/2021 0820   BUN 17 10/27/2021 0820   CREATININE 0.80 10/27/2021 0820   CALCIUM 8.9 10/27/2021 0820   PROT 6.8 10/27/2021 0820   ALBUMIN 4.4 10/27/2021 0820   AST 15 10/27/2021 0820   ALT 15 10/27/2021 0820   ALKPHOS 47 10/27/2021 0820   BILITOT 0.7 10/27/2021 0820   GFRNONAA >60 10/27/2021 0820   GFRAA >60 06/30/2014 0424   Lipase     Component Value Date/Time   LIPASE 12 10/25/2021 1657       Studies/Results: DG Abd Portable 1V-Small Bowel Obstruction Protocol-initial, 8 hr delay  Result Date: 10/26/2021 CLINICAL DATA:  8 hour small-bowel follow up EXAM: PORTABLE ABDOMEN - 1 VIEW  COMPARISON:  10/25/2021 FINDINGS: Gastric catheter remains in the stomach. Previously administered contrast now lies completely within the colon consistent with previous partial small bowel obstruction. The degree of small-bowel dilatation has improved. No free air is noted. No bony changes are seen IMPRESSION: Contrast now lies in the colon consistent with a previous partial small bowel obstruction. The degree of small-bowel dilatation has improved. Electronically Signed   By: Inez Catalina M.D.   On: 10/26/2021 20:01   DG Abd Portable 1 View  Result Date: 10/25/2021 CLINICAL DATA:  NG tube placement EXAM: PORTABLE ABDOMEN - 1 VIEW COMPARISON:  CT abdomen and pelvis earlier today FINDINGS: Enteric tube tip and side port in the stomach. Cholecystectomy clips. IMPRESSION: Enteric tube tip and side-port in the stomach. Electronically Signed   By: Placido Sou M.D.   On: 10/25/2021 23:35   CT ABDOMEN PELVIS W CONTRAST  Result Date: 10/25/2021 CLINICAL DATA:  Nausea, vomiting, abdominal pain, history of multiple bowel obstructions EXAM: CT ABDOMEN AND PELVIS WITH CONTRAST TECHNIQUE: Multidetector CT imaging of the abdomen and pelvis was performed using the standard protocol following bolus administration of intravenous contrast. RADIATION DOSE REDUCTION: This exam was performed according to the departmental dose-optimization program which includes automated exposure control, adjustment of the mA and/or kV according to patient  size and/or use of iterative reconstruction technique. CONTRAST:  26m OMNIPAQUE IOHEXOL 300 MG/ML  SOLN COMPARISON:  12/23/2020 FINDINGS: Lower chest: No acute abnormality. Hepatobiliary: No focal liver abnormality is seen. Status post cholecystectomy. No biliary dilatation. Pancreas: Unremarkable. No pancreatic ductal dilatation or surrounding inflammatory changes. Spleen: Normal in size without significant abnormality. Adrenals/Urinary Tract: Adrenal glands are unremarkable. Kidneys  are normal, without renal calculi, solid lesion, or hydronephrosis. Bladder is unremarkable. Stomach/Bowel: Stomach is within normal limits. Appendix is not clearly visualized and may be surgically absent. Multiple loops of fluid-filled, although not overtly distended distal small bowel. Narrowed, tethered appearance of the terminal ileum, similar to prior examination, with a thickened, inflamed appearing loop of distal ileum in the anterior pelvis approximately 10 cm in length, with an abrupt caliber change in the left hemipelvis (series 2, image 60, 57). Vascular/Lymphatic: Aortic atherosclerosis. No enlarged abdominal or pelvic lymph nodes. Reproductive: No mass or other significant abnormality. Other: No abdominal wall hernia or abnormality. No ascites. Musculoskeletal: No acute or significant osseous findings. IMPRESSION: 1. Multiple loops of fluid-filled, although not overtly distended distal small bowel. Narrowed, tethered appearance of the terminal ileum, similar to prior examination, with a thickened, inflamed appearing loop of distal ileum in the anterior pelvis approximately 10 cm in length, and an abrupt caliber change in the left hemipelvis. 2. Findings suggest partial or developing small bowel obstruction, and general appearance and configuration are suggestive of Crohn's disease with acute inflammation and complicated by ileal stricture. Aortic Atherosclerosis (ICD10-I70.0). Electronically Signed   By: ADelanna AhmadiM.D.   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 She did have high NG output last 24 hours. Clamp NG and give clear liquids. If tolerating this remove NG in about 2  hours and advance as tolerated to FLD.   ID - none FEN - IVF, clamp NG, CLD VTE - SCDs, sq heparin Foley - none  Hashimoto's thyroiditis  I reviewed hospitalist notes, last 24 h vitals and pain scores, last 48 h intake and output, last 24 h labs and trends, and last 24 h imaging results.    LOS: 1 day    [facility] 10/27/2021, 9:14 AM Please see Amion for pager number during day hours 7:00am-4:30pm

## 2021-10-27 NOTE — Progress Notes (Signed)
PROGRESS NOTE  Annaston Buffone MRN:[id number] DOB: 08/25/1975   PCP: Raza, Zahra, MD  Patient is from: Home   DOA: 10/25/2021 LOS: 1  Chief complaints Chief Complaint  Patient presents with   Abdominal Pain     Brief Narrative / Interim history: 46-year-old F with prior history of SBO (never required NG tube or surgery), cervical cancer status postradiation and chemo, radiation enteritis, hypothyroidism, chronic pain, anxiety and depression presenting with abdominal pain and vomiting, and admitted for small bowel obstruction.  CT abdomen and pelvis showed multiple fluid-filled loops of bowel with tethered appearance of the terminal ileum and thickened inflamed distal ileum in the anterior pelvis.  General surgery consulted.  NG tube placed.  SBO protocol initiated.  KUB showed contrast in colon and improvement in SBO.  NG tube clamped.  Started on clear liquid diet.  Subjective: Seen and examined earlier this morning.  No major events overnight of this morning.  No nausea, vomiting or abdominal pain.  About 1.1 L output from NGT, of which 900 cc overnight.  No complaints other than discomfort and pain from NG tube.  No BM but flatus.  Objective: Vitals:   10/26/21 1717 10/26/21 2040 10/27/21 0448 10/27/21 1251  BP: 119/67 110/72 125/83 104/66  Pulse: 69 78 67 66  Resp: 16 17 18 18  Temp: 98 F (36.7 C) 98.8 F (37.1 C) 98.6 F (37 C) 98.6 F (37 C)  TempSrc: Oral Oral Oral Oral  SpO2: 98% 100% 92% 99%  Weight:      Height:        Examination:  GENERAL: No apparent distress.  Nontoxic. HEENT: MMM.  Vision and hearing grossly intact.  NGT in place. NECK: Supple.  No apparent JVD.  RESP:  No IWOB.  Fair aeration bilaterally. CVS:  RRR. Heart sounds normal.  ABD/GI/GU: BS+. Abd soft, NTND.  MSK/EXT:  Moves extremities. No apparent deformity. No edema.  SKIN: no apparent skin lesion or wound NEURO: Awake and alert. Oriented appropriately.  No apparent focal neuro  deficit. PSYCH: Calm. Normal affect.   Procedures:  NG tube placement  Microbiology summarized: None  Assessment and plan: Principal Problem:   SBO (small bowel obstruction) (HCC) Active Problems:   Radiation enteritis   Hashimoto's thyroiditis   Acquired hypothyroidism   Anxiety and depression  Small bowel obstruction: Presents with abdominal pain and vomiting. CT abdomen and pelvis showed multiple fluid-filled loops of bowel with tethered appearance of the terminal ileum and thickened inflamed distal ileum in the anterior pelvis.  Patient reports 4-5 bouts of SBO but never required NGT or surgery.  Has history of cervical cancer status postradiation and chemo.  She has radiation enteritis.  CRP and ESR negative.  KUB shows contrast in colon and improvement in SBO.  No BM but flatus. -General surgery following-NGT clamped, started CLD -Continue IV fluid  Hashimoto's thyroiditis/hypothyroidism -Resume home Synthroid  Anxiety and depression -Resumed home meds  Mild hypophosphatemia/hypokalemia: -IV potassium phosphate 15 mmol x 1  Chronic pain?  Does not seem to be on pain medication per MAR.  History of cervical cancer status post radiation and chemotherapy -Outpatient follow-up.  Body mass index is 21.71 kg/m.          DVT prophylaxis:  heparin injection 5,000 Units Start: 10/26/21 0600 SCDs Start: 10/26/21 0049  Code Status: Full code Family Communication: Updated patient's husband at bedside Level of care: Med-Surg Status is: Inpatient The patient will remain inpatient because: Small bowel obstruction   Final   disposition: Likely home once medically ready. Consultants:  General surgery  Sch Meds:  Scheduled Meds:  amphetamine-dextroamphetamine  10 mg Oral BID WC   [START ON 10/29/2021] estradiol  0.05 mg Transdermal Q Wed   heparin  5,000 Units Subcutaneous Q8H   lamoTRIgine  50 mg Oral Daily   [START ON 10/28/2021] levothyroxine  50 mcg Oral Q0600    sertraline  100 mg Oral q morning   Continuous Infusions:  famotidine (PEPCID) IV 20 mg (10/27/21 0839)   lactated ringers Stopped (10/27/21 1448)   potassium PHOSPHATE IVPB (in mmol) 15 mmol (10/27/21 1452)   PRN Meds:.diazepam, diphenhydrAMINE, HYDROmorphone (DILAUDID) injection, ketorolac, ondansetron (ZOFRAN) IV, phenol  Antimicrobials: Anti-infectives (From admission, onward)    None        I have personally reviewed the following labs and images: CBC: Recent Labs  Lab 10/25/21 1657 10/26/21 1101 10/27/21 0820  WBC 10.8* 10.8* 10.3  NEUTROABS 9.6*  --   --   HGB 14.6 12.4 13.7  HCT 43.3 40.3 45.3  MCV 91.4 97.8 101.1*  PLT 428* 355 257   BMP &GFR Recent Labs  Lab 10/25/21 1657 10/26/21 1101 10/27/21 0820  NA 139 141 140  K 4.0 4.4 3.5  CL 101 108 106  CO2 _0 GLUCOSE 123* 101* 73  BUN _1 CREATININE 0.82 0.60 0.80  CALCIUM 10.3 8.8* 8.9  MG  --  1.9 2.1  PHOS  --  3.0 2.3*   Estimated Creatinine Clearance: 71.1 mL/min (by C-G formula based on SCr of 0.8 mg/dL). Liver & Pancreas: Recent Labs  Lab 10/25/21 1657 10/26/21 1101 10/27/21 0820  AST _2 ALT _3 ALKPHOS 50 43 47  BILITOT 0.5 0.5 0.7  PROT 7.5 6.3* 6.8  ALBUMIN 5.0 3.8 4.4   Recent Labs  Lab 10/25/21 1657  LIPASE 12   No results for input(s): "AMMONIA" in the last 168 hours. Diabetic: No results for input(s): "HGBA1C" in the last 72 hours. No results for input(s): "GLUCAP" in the last 168 hours. Cardiac Enzymes: No results for input(s): "CKTOTAL", "CKMB", "CKMBINDEX", "TROPONINI" in the last 168 hours. No results for input(s): "PROBNP" in the last 8760 hours. Coagulation Profile: No results for input(s): "INR", "PROTIME" in the last 168 hours. Thyroid Function Tests: No results for input(s): "TSH", "T4TOTAL", "FREET4", "T3FREE", "THYROIDAB" in the last 72 hours. Lipid Profile: No results for input(s): "CHOL", "HDL", "LDLCALC", "TRIG", "CHOLHDL",  "LDLDIRECT" in the last 72 hours. Anemia Panel: No results for input(s): "VITAMINB12", "FOLATE", "FERRITIN", "TIBC", "IRON", "RETICCTPCT" in the last 72 hours. Urine analysis:    Component Value Date/Time   COLORURINE YELLOW 10/25/2021 1657   APPEARANCEUR CLEAR 10/25/2021 1657   LABSPEC 1.030 10/25/2021 1657   PHURINE 6.5 10/25/2021 1657   GLUCOSEU NEGATIVE 10/25/2021 1657   HGBUR NEGATIVE 10/25/2021 1657   BILIRUBINUR NEGATIVE 10/25/2021 1657   KETONESUR >80 (A) 10/25/2021 1657   PROTEINUR 30 (A) 10/25/2021 1657   UROBILINOGEN 0.2 06/28/2014 0213   NITRITE NEGATIVE 10/25/2021 1657   LEUKOCYTESUR NEGATIVE 10/25/2021 1657   Sepsis Labs: Invalid input(s): "PROCALCITONIN", "LACTICIDVEN"  Microbiology: No results found for this or any previous visit (from the past 240 hour(s)).  Radiology Studies: DG Abd Portable 1V-Small Bowel Obstruction Protocol-initial, 8 hr delay  Result Date: 10/26/2021 CLINICAL DATA:  8 hour small-bowel follow up EXAM: PORTABLE ABDOMEN - 1 VIEW COMPARISON:  10/25/2021 FINDINGS: Gastric catheter remains in the stomach. Previously administered contrast now lies  completely within the colon consistent with previous partial small bowel obstruction. The degree of small-bowel dilatation has improved. No free air is noted. No bony changes are seen IMPRESSION: Contrast now lies in the colon consistent with a previous partial small bowel obstruction. The degree of small-bowel dilatation has improved. Electronically Signed   By: Mark  Lukens M.D.   On: 10/26/2021 20:01       T.  [facility]  If 7PM-7AM, please contact night-coverage [email or web address] 10/27/2021, 3:02 PM   

## 2021-10-28 DIAGNOSIS — D649 Anemia, unspecified: Secondary | ICD-10-CM

## 2021-10-28 LAB — CBC
HCT: 32.1 % — ABNORMAL LOW (ref 36.0–46.0)
Hemoglobin: 10.5 g/dL — ABNORMAL LOW (ref 12.0–15.0)
MCH: 31.3 pg (ref 26.0–34.0)
MCHC: 32.7 g/dL (ref 30.0–36.0)
MCV: 95.5 fL (ref 80.0–100.0)
Platelets: 293 10*3/uL (ref 150–400)
RBC: 3.36 MIL/uL — ABNORMAL LOW (ref 3.87–5.11)
RDW: 13.2 % (ref 11.5–15.5)
WBC: 8.3 10*3/uL (ref 4.0–10.5)
nRBC: 0 % (ref 0.0–0.2)

## 2021-10-28 LAB — RENAL FUNCTION PANEL
Albumin: 3.2 g/dL — ABNORMAL LOW (ref 3.5–5.0)
Anion gap: 5 (ref 5–15)
BUN: 10 mg/dL (ref 6–20)
CO2: 28 mmol/L (ref 22–32)
Calcium: 8.7 mg/dL — ABNORMAL LOW (ref 8.9–10.3)
Chloride: 108 mmol/L (ref 98–111)
Creatinine, Ser: 0.69 mg/dL (ref 0.44–1.00)
GFR, Estimated: >60 mL/min (ref >60)
Glucose, Bld: 127 mg/dL — ABNORMAL HIGH (ref 70–99)
Phosphorus: 2.4 mg/dL — ABNORMAL LOW (ref 2.5–4.6)
Potassium: 3.7 mmol/L (ref 3.5–5.1)
Sodium: 141 mmol/L (ref 135–145)

## 2021-10-28 LAB — MAGNESIUM: Magnesium: 1.9 mg/dL (ref 1.7–2.4)

## 2021-10-28 NOTE — Progress Notes (Signed)
Patient was given discharge instructions, and all questions were answered. Patient was stable for discharge and was walked to the main exit. 

## 2021-10-28 NOTE — Progress Notes (Signed)
[facility] Progress Note     Subjective: CC-  Feeling much better. Denies abdominal pain, nausea, vomiting. Tolerating full liquids. Passing flatus and had multiple loose Bms yesterday.  Objective: Vital signs in last 24 hours: Temp:  [98.4 F (36.9 C)-98.7 F (37.1 C)] 98.4 F (36.9 C) (10/03 0555) Pulse Rate:  [66-74] 74 (10/03 0555) Resp:  [18] 18 (10/03 0555) BP: (86-104)/(62-66) 102/62 (10/03 0555) SpO2:  [98 %-99 %] 98 % (10/03 0555) Last BM Date : 10/27/21  Intake/Output from previous day: 10/02 0701 - 10/03 0700 In: 1284.9 [P.O.:600; I.V.:500.4; IV Piggyback:184.5] Out: 350 [Emesis/NG output:350] Intake/Output this shift: No intake/output data recorded.  PE: Gen:  Alert, NAD, pleasant Abd: soft, ND, NT  Lab Results:  Recent Labs    10/27/21 0820 10/28/21 0430  WBC 10.3 8.3  HGB 13.7 10.5*  HCT 45.3 32.1*  PLT 257 293   BMET Recent Labs    10/27/21 0820 10/28/21 0430  NA 140 141  K 3.5 3.7  CL 106 108  CO2 23 28  GLUCOSE 73 127*  BUN 17 10  CREATININE 0.80 0.69  CALCIUM 8.9 8.7*   PT/INR No results for input(s): "LABPROT", "INR" in the last 72 hours. CMP     Component Value Date/Time   NA 141 10/28/2021 0430   K 3.7 10/28/2021 0430   CL 108 10/28/2021 0430   CO2 28 10/28/2021 0430   GLUCOSE 127 (H) 10/28/2021 0430   BUN 10 10/28/2021 0430   CREATININE 0.69 10/28/2021 0430   CALCIUM 8.7 (L) 10/28/2021 0430   PROT 6.8 10/27/2021 0820   ALBUMIN 3.2 (L) 10/28/2021 0430   AST 15 10/27/2021 0820   ALT 15 10/27/2021 0820   ALKPHOS 47 10/27/2021 0820   BILITOT 0.7 10/27/2021 0820   GFRNONAA >60 10/28/2021 0430   GFRAA >60 06/30/2014 0424   Lipase     Component Value Date/Time   LIPASE 12 10/25/2021 1657       Studies/Results: DG Abd Portable 1V-Small Bowel Obstruction Protocol-initial, 8 hr delay  Result Date: 10/26/2021 CLINICAL DATA:  8 hour small-bowel follow up EXAM: PORTABLE ABDOMEN - 1 VIEW COMPARISON:   10/25/2021 FINDINGS: Gastric catheter remains in the stomach. Previously administered contrast now lies completely within the colon consistent with previous partial small bowel obstruction. The degree of small-bowel dilatation has improved. No free air is noted. No bony changes are seen IMPRESSION: Contrast now lies in the colon consistent with a previous partial small bowel obstruction. The degree of small-bowel dilatation has improved. Electronically Signed   By: Inez Catalina M.D.   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 Advance to soft diet. [place] for discharge from surgical standpoint.   ID - none FEN - soft diet VTE - SCDs, sq heparin Foley - none   Hashimoto's thyroiditis   I reviewed hospitalist notes, last 24 h vitals and pain scores, last 48 h intake and output, last 24 h labs and trends, and last 24 h imaging results.    LOS: 2 days    Wellington Hampshire, [facility] 10/28/2021, 9:49 AM Please see Amion for pager number during day hours 7:00am-4:30pm

## 2021-10-28 NOTE — Discharge Summary (Signed)
Physician Discharge Summary  Sue Benson UKG:[id number] DOB: 1976/01/10 DOA: 10/25/2021  PCP: Curly Shores, MD  Admit date: 10/25/2021 Discharge date: 10/28/2021 Admitted From: Home. Disposition: Home. Recommendations for Outpatient Follow-up:  Follow up with PCP in in 1 to 2 weeks  Check BMP and CBC in 1 to 2 weeks Please follow up on the following pending results: None  Home Health: Not indicated Equipment/Devices: Not indicated  Discharge Condition: Stable CODE STATUS: Full code  Follow-up Information     Curly Shores, MD. Schedule an appointment as soon as possible for a visit in 1 week(s).   Specialty: Family Medicine Contact information: [place] [address] [phone]                 Hospital course 46 year old F with prior history of SBO (never required NG tube or surgery), cervical cancer status postradiation and chemo, radiation enteritis, hypothyroidism, chronic pain, anxiety and depression presenting with abdominal pain and vomiting, and admitted for small bowel obstruction.  CT abdomen and pelvis showed multiple fluid-filled loops of bowel with tethered appearance of the terminal ileum and thickened inflamed distal ileum in the anterior pelvis.  General surgery consulted.  NG tube placed.   SBO protocol initiated.  KUB showed contrast in colon and improvement in SBO.  NG tube clamped, then discontinued.  Patient tolerated soft diet and cleared for discharge by general surgery.  CRP and ESR within normal.  Of note, some drop in Hgb but no evidence of bleeding.  Likely dilutional from IV fluid.  Recommend repeat CBC in 1 to 2 weeks.  Advised to avoid NSAID.  See individual problem list below for more.   Problems addressed during this hospitalization Principal Problem:   SBO (small bowel obstruction) (HCC) Active Problems:   Radiation enteritis   Hashimoto's thyroiditis   Acquired hypothyroidism   Anxiety and depression   Normocytic  anemia              Vital signs Vitals:   10/27/21 0448 10/27/21 1251 10/27/21 2046 10/28/21 0555  BP: 125/83 104/66 (!) 86/66 102/62  Pulse: 67 66 71 74  Temp: 98.6 F (37 C) 98.6 F (37 C) 98.7 F (37.1 C) 98.4 F (36.9 C)  Resp: _0 Height:      Weight:      SpO2: 92% 99% 99% 98%  TempSrc: Oral Oral Oral Oral  BMI (Calculated):         Discharge exam  GENERAL: No apparent distress.  Nontoxic. HEENT: MMM.  Vision and hearing grossly intact.  NECK: Supple.  No apparent JVD.  RESP:  No IWOB.  Fair aeration bilaterally. CVS:  RRR. Heart sounds normal.  ABD/GI/GU: BS+. Abd soft, NTND.  MSK/EXT:  Moves extremities. No apparent deformity. No edema.  SKIN: no apparent skin lesion or wound NEURO: Awake and alert. Oriented appropriately.  No apparent focal neuro deficit. PSYCH: Calm. Normal affect.   Discharge Instructions Discharge Instructions     Call MD for:  persistant nausea and vomiting   Complete by: As directed    Call MD for:  severe uncontrolled pain   Complete by: As directed    Diet general   Complete by: As directed    Discharge instructions   Complete by: As directed    It has been a pleasure taking care of you!  You were hospitalized due to partial small bowel obstruction for which you have been treated with nasogastric tube.  Your  symptoms resolved to the point we think it is safe to let you go home and follow-up with your primary care doctor.  Is very important that you avoid constipation.  You may use MiraLAX and/or Senokot as needed to avoid constipation.    Take care,   Increase activity slowly   Complete by: As directed       Allergies as of 10/28/2021       Reactions   Penicillins Anaphylaxis   Prilosec [omeprazole] Other (See Comments)   Patient states that it "shut down" her stomach "too much"   Ceclor [cefaclor] Rash        Medication List     TAKE these medications    acetaminophen 500 MG tablet Commonly known  as: TYLENOL Take 500 mg by mouth every 6 (six) hours as needed for mild pain or headache.   Advil 200 MG tablet Generic drug: ibuprofen Take 400 mg by mouth every 6 (six) hours as needed for headache. What changed: Another medication with the same name was removed. Continue taking this medication, and follow the directions you see here.   amphetamine-dextroamphetamine 10 MG tablet Commonly known as: ADDERALL Take 10 mg by mouth 2 (two) times daily with a meal.   buPROPion 300 MG 24 hr tablet Commonly known as: WELLBUTRIN XL Take 300 mg by mouth every morning.   diazepam 10 MG tablet Commonly known as: VALIUM 10 mg See admin instructions. Insert 10 mg, per vagina, at bedtime as needed/as directed   doxylamine (Sleep) 25 MG tablet Commonly known as: UNISOM Take 25 mg by mouth at bedtime as needed for sleep.   estradiol 0.05 mg/24hr patch Commonly known as: CLIMARA - Dosed in mg/24 hr Place 0.05 mg onto the skin every Wednesday.   fluticasone 50 MCG/ACT nasal spray Commonly known as: FLONASE Place 1-2 sprays into both nostrils every other day.   Gas-X Extra Strength 125 MG chewable tablet Generic drug: simethicone Chew 125 mg by mouth every 6 (six) hours as needed for flatulence.   lamoTRIgine 100 MG tablet Commonly known as: LAMICTAL Take 100 mg by mouth daily.   levothyroxine 50 MCG tablet Commonly known as: SYNTHROID Take 50 mcg by mouth every morning.   Mucinex 600 MG 12 hr tablet Generic drug: guaiFENesin Take 600 mg by mouth 2 (two) times daily as needed for to loosen phlegm or cough.   ondansetron 4 MG tablet Commonly known as: ZOFRAN Take 1 tablet (4 mg total) by mouth every 8 (eight) hours as needed for nausea or vomiting.   sertraline 100 MG tablet Commonly known as: ZOLOFT Take 100 mg by mouth every morning.        Consultations: General surgery  Procedures/Studies: Nasogastric tube insertion   DG Abd Portable 1V-Small Bowel Obstruction  Protocol-initial, 8 hr delay  Result Date: 10/26/2021 CLINICAL DATA:  8 hour small-bowel follow up EXAM: PORTABLE ABDOMEN - 1 VIEW COMPARISON:  10/25/2021 FINDINGS: Gastric catheter remains in the stomach. Previously administered contrast now lies completely within the colon consistent with previous partial small bowel obstruction. The degree of small-bowel dilatation has improved. No free air is noted. No bony changes are seen IMPRESSION: Contrast now lies in the colon consistent with a previous partial small bowel obstruction. The degree of small-bowel dilatation has improved. Electronically Signed   By: Inez Catalina M.D.   On: 10/26/2021 20:01   DG Abd Portable 1 View  Result Date: 10/25/2021 CLINICAL DATA:  NG tube placement EXAM: PORTABLE ABDOMEN - 1  VIEW COMPARISON:  CT abdomen and pelvis earlier today FINDINGS: Enteric tube tip and side port in the stomach. Cholecystectomy clips. IMPRESSION: Enteric tube tip and side-port in the stomach. Electronically Signed   By: Placido Sou M.D.   On: 10/25/2021 23:35   CT ABDOMEN PELVIS W CONTRAST  Result Date: 10/25/2021 CLINICAL DATA:  Nausea, vomiting, abdominal pain, history of multiple bowel obstructions EXAM: CT ABDOMEN AND PELVIS WITH CONTRAST TECHNIQUE: Multidetector CT imaging of the abdomen and pelvis was performed using the standard protocol following bolus administration of intravenous contrast. RADIATION DOSE REDUCTION: This exam was performed according to the departmental dose-optimization program which includes automated exposure control, adjustment of the mA and/or kV according to patient size and/or use of iterative reconstruction technique. CONTRAST:  36m OMNIPAQUE IOHEXOL 300 MG/ML  SOLN COMPARISON:  12/23/2020 FINDINGS: Lower chest: No acute abnormality. Hepatobiliary: No focal liver abnormality is seen. Status post cholecystectomy. No biliary dilatation. Pancreas: Unremarkable. No pancreatic ductal dilatation or surrounding inflammatory  changes. Spleen: Normal in size without significant abnormality. Adrenals/Urinary Tract: Adrenal glands are unremarkable. Kidneys are normal, without renal calculi, solid lesion, or hydronephrosis. Bladder is unremarkable. Stomach/Bowel: Stomach is within normal limits. Appendix is not clearly visualized and may be surgically absent. Multiple loops of fluid-filled, although not overtly distended distal small bowel. Narrowed, tethered appearance of the terminal ileum, similar to prior examination, with a thickened, inflamed appearing loop of distal ileum in the anterior pelvis approximately 10 cm in length, with an abrupt caliber change in the left hemipelvis (series 2, image 60, 57). Vascular/Lymphatic: Aortic atherosclerosis. No enlarged abdominal or pelvic lymph nodes. Reproductive: No mass or other significant abnormality. Other: No abdominal wall hernia or abnormality. No ascites. Musculoskeletal: No acute or significant osseous findings. IMPRESSION: 1. Multiple loops of fluid-filled, although not overtly distended distal small bowel. Narrowed, tethered appearance of the terminal ileum, similar to prior examination, with a thickened, inflamed appearing loop of distal ileum in the anterior pelvis approximately 10 cm in length, and an abrupt caliber change in the left hemipelvis. 2. Findings suggest partial or developing small bowel obstruction, and general appearance and configuration are suggestive of Crohn's disease with acute inflammation and complicated by ileal stricture. Aortic Atherosclerosis (ICD10-I70.0). Electronically Signed   By: ADelanna AhmadiM.D.   On: 10/25/2021 19:19       The results of significant diagnostics from this hospitalization (including imaging, microbiology, ancillary and laboratory) are listed below for reference.     Microbiology: No results found for this or any previous visit (from the past 240 hour(s)).   Labs:  CBC: Recent Labs  Lab 10/25/21 1657 10/26/21 1101  10/27/21 0820 10/28/21 0430  WBC 10.8* 10.8* 10.3 8.3  NEUTROABS 9.6*  --   --   --   HGB 14.6 12.4 13.7 10.5*  HCT 43.3 40.3 45.3 32.1*  MCV 91.4 97.8 101.1* 95.5  PLT 428* 355 257 293   BMP &GFR Recent Labs  Lab 10/25/21 1657 10/26/21 1101 10/27/21 0820 10/28/21 0430  NA 139 141 140 141  K 4.0 4.4 3.5 3.7  CL 101 108 106 108  CO2 _0 GLUCOSE 123* 101* 73 127*  BUN _1 CREATININE 0.82 0.60 0.80 0.69  CALCIUM 10.3 8.8* 8.9 8.7*  MG  --  1.9 2.1 1.9  PHOS  --  3.0 2.3* 2.4*   Estimated Creatinine Clearance: 71.1 mL/min (by C-G formula based on SCr of 0.69 mg/dL). Liver & Pancreas: Recent Labs  Lab 10/25/21 1657 10/26/21 1101 10/27/21 0820 10/28/21 0430  AST _0 --   ALT _1 --   ALKPHOS 50 43 47  --   BILITOT 0.5 0.5 0.7  --   PROT 7.5 6.3* 6.8  --   ALBUMIN 5.0 3.8 4.4 3.2*   Recent Labs  Lab 10/25/21 1657  LIPASE 12   No results for input(s): "AMMONIA" in the last 168 hours. Diabetic: No results for input(s): "HGBA1C" in the last 72 hours. No results for input(s): "GLUCAP" in the last 168 hours. Cardiac Enzymes: No results for input(s): "CKTOTAL", "CKMB", "CKMBINDEX", "TROPONINI" in the last 168 hours. No results for input(s): "PROBNP" in the last 8760 hours. Coagulation Profile: No results for input(s): "INR", "PROTIME" in the last 168 hours. Thyroid Function Tests: No results for input(s): "TSH", "T4TOTAL", "FREET4", "T3FREE", "THYROIDAB" in the last 72 hours. Lipid Profile: No results for input(s): "CHOL", "HDL", "LDLCALC", "TRIG", "CHOLHDL", "LDLDIRECT" in the last 72 hours. Anemia Panel: No results for input(s): "VITAMINB12", "FOLATE", "FERRITIN", "TIBC", "IRON", "RETICCTPCT" in the last 72 hours. Urine analysis:    Component Value Date/Time   COLORURINE YELLOW 10/25/2021 1657   APPEARANCEUR CLEAR 10/25/2021 1657   LABSPEC 1.030 10/25/2021 1657   PHURINE 6.5 10/25/2021 1657   GLUCOSEU NEGATIVE 10/25/2021 1657    HGBUR NEGATIVE 10/25/2021 1657   BILIRUBINUR NEGATIVE 10/25/2021 1657   KETONESUR >80 (A) 10/25/2021 1657   PROTEINUR 30 (A) 10/25/2021 1657   UROBILINOGEN 0.2 06/28/2014 0213   NITRITE NEGATIVE 10/25/2021 1657   LEUKOCYTESUR NEGATIVE 10/25/2021 1657   Sepsis Labs: Invalid input(s): "PROCALCITONIN", "LACTICIDVEN"   SIGNED:  Mercy Riding, MD  [facility] 10/28/2021, 4:38 PM

## 2022-01-29 ENCOUNTER — Other Ambulatory Visit: Payer: Self-pay | Admitting: Internal Medicine

## 2022-01-29 DIAGNOSIS — Z1231 Encounter for screening mammogram for malignant neoplasm of breast: Secondary | ICD-10-CM

## 2022-02-06 ENCOUNTER — Ambulatory Visit
Admission: RE | Admit: 2022-02-06 | Discharge: 2022-02-06 | Disposition: A | Discharge status: Home / Self Care | Payer: Managed Care, Other (non HMO) | Source: Ambulatory Visit | Attending: Internal Medicine | Admitting: Internal Medicine

## 2022-02-06 DIAGNOSIS — Z1231 Encounter for screening mammogram for malignant neoplasm of breast: Secondary | ICD-10-CM

## 2022-04-28 ENCOUNTER — Telehealth: Payer: Self-pay | Admitting: *Deleted

## 2022-04-28 NOTE — Telephone Encounter (Signed)
Spoke with the patient and explained "Per Dr Berline Lopes that she is over 10 years out that she can follow up with regular gyn doctor. Explained that our office releases patient's after 5 years." Patient asked "are you a pert of [facility] gyn onc. I sw Dr Micael Hampshire before I moved." Explained that our office is a satellite office from the main [facility]; that she can call the main [facility] office to see if they will see her. Patient verbalized understanding

## 2022-05-25 ENCOUNTER — Ambulatory Visit: Payer: Managed Care, Other (non HMO) | Admitting: Gastroenterology

## 2022-05-25 ENCOUNTER — Encounter: Payer: Self-pay | Admitting: Gastroenterology

## 2022-05-25 VITALS — BP 102/64 | HR 97 | Ht 63.0 in | Wt 119.0 lb

## 2022-05-25 DIAGNOSIS — Z8719 Personal history of other diseases of the digestive system: Secondary | ICD-10-CM

## 2022-05-25 DIAGNOSIS — K5900 Constipation, unspecified: Secondary | ICD-10-CM | POA: Diagnosis not present

## 2022-05-25 NOTE — Progress Notes (Unsigned)
HPI : Jovon Streetman is a very pleasant 47 year old female with a history of metastatic cervical cancer status postradiation and chemotherapy who is referred to [place] by Dr.Zahra Raza due to history of recurrent small bowel obstructions.  The patient estimates that she has had 5 or 6 admissions for small bowel obstructions, most recently in October 2023.  These obstructions have always resolved with conservative therapy.  Only once has she had an NG tube placed. Her small bowel obstructions are attributed to scarring and adhesions from her radiation therapy back in 2010. In between these episodes of obstruction, she does struggle with constipation, manifested by small volume stools which are typically small and hard.  She usually has a bowel movement every day or every other day.  Because of her recurrent bowel obstructions, she follows a low residue diet, and therefore has to limit her fiber intake contributing to her constipation.  She does take MiraLAX, but this will cause loose stools if she takes it too frequently. She has been taking a Probiotic which has helped a little She has occasional episodes of colicky pain which are similar to her obstructive symptoms but are milder.  She would like to meet with a nutritionist to help get advice for how to maximize her nutrition intake while on a low residue diet.   Previous GI evaluation (Care Everywhere)  Colonoscopy July 15, 2020 ([facility]) Diverticulosis throughout the entire colon, otherwise normal Repeat colonoscopy recommended in 10 years  CT abdomen/pelvis May 04, 2020 ([facility]) 1.  History of cervical cancer with brachytherapy seeds noted in the pelvis. Limited assessment of the uterus and cervix; the uterus appears diminutive.  2.  There are dilated small bowel loops in the lower abdomen and pelvis, with one focally dilated ileal loop posterior to the uterus leading directly to what appears to be a focally strictured ileal loop to the left of the  uterus (series 3, image 331); at and beyond the suspected stricture the ileum demonstrates wall thickening and is nondilated. There is a long segment of the distal ileum beyond the stricture which appears to be narrowed/nondilated, with wall thickening, extending to the ileocecal valve. 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 The distal ileum demonstrates diffuse narrowing and wall thickening which can also be due to of chronic radiation enteritis.  3.  There is no pneumatosis or free air. There is no significant stranding adjacent to the dilated small bowel loops, though there is small amount of free fluid in the pelvis, likely secondary to the low grade small bowel obstruction.  4.  No loculated fluid collection.  5.  Gallbladder surgically absent. Mild prominence of the intrahepatic biliary tree may be secondary to prior cholecystectomy however correlate with LFTs given patient's symptoms. The extrahepatic duct is nondilated.  6.  A few scattered colonic diverticula are noted, without evidence of diverticulitis   Colonoscopy 2012 [facility]) Report not available  Colonoscopy 2010 [facility]) Report not available, but reported polyps found  Past Medical History:  Diagnosis Date   Cancer (HCC)    Chronic constipation    SECONDARY TO RADIATION   Depression    Dyspareunia    Frequency of urination    History of cervical cancer ONCOLOGIST--  [place]   DX 2010--  S/P RADIATION (EXTERNAL AND BRACHY) AND CHEMO SPRING 2010--  NO RECURRENCE  History of concussion    NO RESIDUAL   History of kidney stones    Mild acid reflux    Nerve pain    legs   Nocturia    Radiation cystitis    SBO (small bowel obstruction) (HCC)    Thyroid disease    Wears glasses      Past Surgical History:  Procedure Laterality Date    CERVICAL CONE BIOPSY  FALL 2009   CYSTO/ BILATERAL URETEROSCOPIC STONE EXTRACTIONS AND STENT PLACEMENT  FALL 2013   CYSTOSCOPY WITH BIOPSY N/A 02/03/2013   Procedure: CYSTOSCOPY WITH BIOPSY, CAUTERIZATION OF THE BIOPSY SITES INSTILLATION OF PYRIDIUM  AND RECTAL EXAM UNDER ANESTHESIA;  Surgeon: Kathi Ludwig, MD;  Location: [facility] [place];  Service: Urology;  Laterality: N/A;   EXTRACORPOREAL SHOCK WAVE LITHOTRIPSY  SUMMER 2013   TONSILLECTOMY   FALL 2010-- LEFT SIDE   2007--- RIGHT SIDE  AND REMOVAL BENIGN CYST   Family History  Problem Relation Age of Onset   Diabetes Mother    Fibromyalgia Mother    Myasthenia gravis Father    Breast cancer Maternal Grandmother    Cancer - Colon Neg Hx    Esophageal cancer Neg Hx    Stomach cancer Neg Hx    Social History   Tobacco Use   Smoking status: Every Day    Packs/day: 0.50    Years: 20.00    Additional pack years: 0.00    Total pack years: 10.00    Types: Cigarettes, E-cigarettes   Smokeless tobacco: Never   Tobacco comments:    CURRENTLY USES ECIG:  less than a cartridge a day  Vaping Use   Vaping Use: Every day   Substances: Nicotine, Flavoring  Substance Use Topics   Alcohol use: No    Alcohol/week: 0.0 standard drinks of alcohol   Drug use: No   Current Outpatient Medications  Medication Sig Dispense Refill   acetaminophen (TYLENOL) 500 MG tablet Take 500 mg by mouth every 6 (six) hours as needed for mild pain or headache.     ADVIL 200 MG tablet Take 400 mg by mouth every 6 (six) hours as needed for headache.     amphetamine-dextroamphetamine (ADDERALL) 10 MG tablet Take 10 mg by mouth 2 (two) times daily with a meal.     buPROPion (WELLBUTRIN XL) 300 MG 24 hr tablet Take 300 mg by mouth every morning.     diazepam (VALIUM) 10 MG tablet 10 mg See admin instructions. Insert 10 mg, per vagina, at bedtime as needed/as directed     doxylamine, Sleep, (UNISOM) 25 MG tablet Take 25 mg by mouth at bedtime as  needed for sleep.     estradiol (CLIMARA - DOSED IN MG/24 HR) 0.05 mg/24hr patch Place 0.05 mg onto the skin every Wednesday.     fluticasone (FLONASE) 50 MCG/ACT nasal spray Place 1-2 sprays into both nostrils every other day.     GAS-X EXTRA STRENGTH 125 MG chewable tablet Chew 125 mg by mouth every 6 (six) hours as needed for flatulence.     hyoscyamine (ANASPAZ) 0.125 MG TBDP disintergrating tablet Hyoscyamine     lamoTRIgine (LAMICTAL) 100 MG tablet Take 25 mg by mouth daily.     levothyroxine (SYNTHROID) 50 MCG tablet Take 50 mcg by mouth every morning.     MUCINEX 600 MG 12 hr tablet Take 600 mg by mouth 2 (two) times daily as needed for to loosen phlegm or cough.     ondansetron (  ZOFRAN) 4 MG tablet Take 1 tablet (4 mg total) by mouth every 8 (eight) hours as needed for nausea or vomiting. 20 tablet 0   sertraline (ZOLOFT) 100 MG tablet Take 100 mg by mouth every morning.     No current facility-administered medications for this visit.   Allergies  Allergen Reactions   Penicillins Anaphylaxis   Prilosec [Omeprazole] Other (See Comments)    Patient states that it "shut down" her stomach "too much"   Ceclor [Cefaclor] Rash     Review of Systems: All systems reviewed and negative except where noted in HPI.    No results found.  Physical Exam: BP 102/64   Pulse 97   Ht 5\' 3"  (1.6 m)   Wt 119 lb (54 kg)   BMI 21.08 kg/m  Constitutional: Pleasant,well-developed, Caucasian female in no acute distress. HEENT: Normocephalic and atraumatic. Conjunctivae are normal. No scleral icterus. Neck supple.  Cardiovascular: Normal rate, regular rhythm.  Pulmonary/chest: Effort normal and breath sounds normal. No wheezing, rales or rhonchi. Abdominal: Soft, nondistended, nontender. Bowel sounds active throughout. There are no masses palpable. No hepatomegaly. Extremities: no edema Neurological: Alert and oriented to person place and time. Skin: Skin is warm and dry. No rashes  noted. Psychiatric: Normal mood and affect. Behavior is normal.  CBC    Component Value Date/Time   WBC 8.3 10/28/2021 0430   RBC 3.36 (L) 10/28/2021 0430   HGB 10.5 (L) 10/28/2021 0430   HCT 32.1 (L) 10/28/2021 0430   PLT 293 10/28/2021 0430   MCV 95.5 10/28/2021 0430   MCH 31.3 10/28/2021 0430   MCHC 32.7 10/28/2021 0430   RDW 13.2 10/28/2021 0430   LYMPHSABS 0.7 10/25/2021 1657   MONOABS 0.5 10/25/2021 1657   EOSABS 0.0 10/25/2021 1657   BASOSABS 0.1 10/25/2021 1657    CMP     Component Value Date/Time   NA 141 10/28/2021 0430   K 3.7 10/28/2021 0430   CL 108 10/28/2021 0430   CO2 28 10/28/2021 0430   GLUCOSE 127 (H) 10/28/2021 0430   BUN 10 10/28/2021 0430   CREATININE 0.69 10/28/2021 0430   CALCIUM 8.7 (L) 10/28/2021 0430   PROT 6.8 10/27/2021 0820   ALBUMIN 3.2 (L) 10/28/2021 0430   AST 15 10/27/2021 0820   ALT 15 10/27/2021 0820   ALKPHOS 47 10/27/2021 0820   BILITOT 0.7 10/27/2021 0820   GFRNONAA >60 10/28/2021 0430   GFRAA >60 06/30/2014 0424     ASSESSMENT AND PLAN: 47 year old female with history of radiation and chemotherapy for cervical cancer in 2010, with current small bowel obstructions from radiation enteritis/adhesions, most recently in October 2023.  She is following a low residue diet, and is concerned about getting adequate nutrition.  She is also bothered by chronic constipation, which is likely in part due to her low residue diet.  She has trouble with loose stools when she takes MiraLAX too often.  I recommended she alternate MiraLAX with Colace to keep her stool soft.  Will refer to a nutritionist to see if she can optimize her intake of fruits and vegetables while also adhering to a low residue diet. Patient will not be due for colon cancer screening until 2032.  Constipation - MiraLax alternating with colace  History of recurrent SBO - Low residue diet - Referral to nutrition  Colon cancer screening - Will be due 2032  Travontae Freiberger E.  Tomasa Rand, MD [facility]   Clovis Fredrickson, MD

## 2022-05-25 NOTE — Patient Instructions (Addendum)
_______________________________________________________  If your blood pressure at your visit was 140/90 or greater, please contact your primary care physician to follow up on this.  _______________________________________________________  If you are age 49 or older, your body mass index should be between 23-30. Your Body mass index is 21.08 kg/m. If this is out of the aforementioned range listed, please consider follow up with your Primary Care Provider.  If you are age 9 or younger, your body mass index should be between 19-25. Your Body mass index is 21.08 kg/m. If this is out of the aformentioned range listed, please consider follow up with your Primary Care Provider.   Start Miralax and Colace for constipation.  We have sent a referral to nutrition and they will call you with an appointment. ________________________________________________________  The [facility] providers would like to encourage you to use [facility] to communicate with providers for non-urgent requests or questions.  Due to long hold times on the telephone, sending your provider a message by [facility] may be a faster and more efficient way to get a response.  Please allow 48 business hours for a response.  Please remember that this is for non-urgent requests.   It was a pleasure to see you today!  Thank you for trusting me with your gastrointestinal care!

## 2022-12-31 ENCOUNTER — Encounter: Payer: Self-pay | Admitting: Internal Medicine

## 2022-12-31 ENCOUNTER — Ambulatory Visit (INDEPENDENT_AMBULATORY_CARE_PROVIDER_SITE_OTHER): Payer: Managed Care, Other (non HMO) | Admitting: Internal Medicine

## 2022-12-31 VITALS — BP 120/70 | HR 77 | Ht 63.0 in | Wt 120.0 lb

## 2022-12-31 DIAGNOSIS — E2740 Unspecified adrenocortical insufficiency: Secondary | ICD-10-CM | POA: Diagnosis not present

## 2022-12-31 DIAGNOSIS — E063 Autoimmune thyroiditis: Secondary | ICD-10-CM

## 2022-12-31 DIAGNOSIS — E041 Nontoxic single thyroid nodule: Secondary | ICD-10-CM

## 2022-12-31 NOTE — Patient Instructions (Addendum)
Please stop at the lab.  Continue levothyroxine 50 mcg daily.  Take the thyroid hormone every day, with water, at least 30 minutes before breakfast, separated by at least 4 hours from: - acid reflux medications - calcium - iron - multivitamins  We will check a new neck Ultrasound.  Please come back for a follow-up appointment in 1 year.
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
## 2023-01-01 LAB — TSH: TSH: 1.29 m[IU]/L

## 2023-01-01 LAB — T4, FREE: Free T4: 1.1 ng/dL (ref 0.8–1.8)

## 2023-01-04 ENCOUNTER — Ambulatory Visit
Admission: RE | Admit: 2023-01-04 | Discharge: 2023-01-04 | Disposition: A | Discharge status: Home / Self Care | Payer: Managed Care, Other (non HMO) | Source: Ambulatory Visit | Attending: Internal Medicine | Admitting: Internal Medicine

## 2023-01-04 DIAGNOSIS — E041 Nontoxic single thyroid nodule: Secondary | ICD-10-CM

## 2023-10-21 ENCOUNTER — Other Ambulatory Visit: Payer: Self-pay | Admitting: Obstetrics and Gynecology

## 2023-10-21 DIAGNOSIS — R928 Other abnormal and inconclusive findings on diagnostic imaging of breast: Secondary | ICD-10-CM

## 2023-10-25 ENCOUNTER — Other Ambulatory Visit: Payer: Self-pay | Admitting: Medical Genetics

## 2023-11-01 ENCOUNTER — Ambulatory Visit
Admission: RE | Admit: 2023-11-01 | Discharge: 2023-11-01 | Disposition: A | Discharge status: Home / Self Care | Source: Ambulatory Visit | Attending: Obstetrics and Gynecology | Admitting: Obstetrics and Gynecology

## 2023-11-01 ENCOUNTER — Ambulatory Visit
Admission: RE | Admit: 2023-11-01 | Discharge: 2023-11-01 | Disposition: A | Source: Ambulatory Visit | Attending: Obstetrics and Gynecology | Admitting: Obstetrics and Gynecology

## 2023-11-01 ENCOUNTER — Other Ambulatory Visit: Payer: Self-pay | Admitting: Obstetrics and Gynecology

## 2023-11-01 DIAGNOSIS — N6311 Unspecified lump in the right breast, upper outer quadrant: Secondary | ICD-10-CM

## 2023-11-01 DIAGNOSIS — R928 Other abnormal and inconclusive findings on diagnostic imaging of breast: Secondary | ICD-10-CM

## 2023-11-01 DIAGNOSIS — N6320 Unspecified lump in the left breast, unspecified quadrant: Secondary | ICD-10-CM

## 2023-11-05 ENCOUNTER — Ambulatory Visit
Admission: RE | Admit: 2023-11-05 | Discharge: 2023-11-05 | Disposition: A | Discharge status: Home / Self Care | Source: Ambulatory Visit | Attending: Obstetrics and Gynecology | Admitting: Obstetrics and Gynecology

## 2023-11-05 DIAGNOSIS — N6320 Unspecified lump in the left breast, unspecified quadrant: Secondary | ICD-10-CM

## 2023-11-05 DIAGNOSIS — N6311 Unspecified lump in the right breast, upper outer quadrant: Secondary | ICD-10-CM

## 2023-11-05 HISTORY — PX: BREAST BIOPSY: SHX20

## 2023-11-08 LAB — SURGICAL PATHOLOGY

## 2024-01-03 ENCOUNTER — Other Ambulatory Visit

## 2024-01-03 ENCOUNTER — Ambulatory Visit: Payer: Managed Care, Other (non HMO) | Admitting: Internal Medicine

## 2024-01-03 ENCOUNTER — Encounter: Payer: Self-pay | Admitting: Internal Medicine

## 2024-01-03 VITALS — BP 112/60 | HR 71 | Ht 63.0 in | Wt 120.6 lb

## 2024-01-03 DIAGNOSIS — E041 Nontoxic single thyroid nodule: Secondary | ICD-10-CM | POA: Diagnosis not present

## 2024-01-03 DIAGNOSIS — E2740 Unspecified adrenocortical insufficiency: Secondary | ICD-10-CM | POA: Diagnosis not present

## 2024-01-03 DIAGNOSIS — E063 Autoimmune thyroiditis: Secondary | ICD-10-CM

## 2024-01-03 NOTE — Patient Instructions (Signed)
 Please stop at the lab.  Continue levothyroxine  50 mcg daily.  Take the thyroid  hormone every day, with water , at least 30 minutes before breakfast, separated by at least 4 hours from: - acid reflux medications - calcium - iron - multivitamins  Please come back for a follow-up appointment in 1 year.

## 2024-01-03 NOTE — Progress Notes (Signed)
 Patient ID: Sue Benson, female   DOB: 06-24-1975, 48 y.o.   MRN: [id number]  HPI  Sue Benson is a 48 y.o.-year-old female, referred by her PCP, Dr. Violeta Benson, for management of hypothyroidism.  I previously saw the patient for Hashimoto's hypothyroidism and central adrenal insufficiency. Patient moved to [facility], [place]  after for visit from 2019 but returned in 06/2021 and reestablished care with me.  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  She is now a forensic scientist. She has a significant history of metastatic cervical cancer, diagnosed in 2010, for which she had chemotherapy and radiation therapy.  She developed scarring and adhesions and she had significant problems with recurring SBO's, constipation, radiation cystitis and enteritis, and was on opiates.  She developed adrenal insufficiency from the opiates.  However, before our last visit, she was able to come off opiates (fentanyl , oxycodone ) after starting bladder installations, previously at [facility], now prn at [facility]-Dr. Cam. No nausea, vomiting, unintentional weight loss.  She does have fatigue and decreased appetite, which are chronic for her.  She is limited in what she can eat due to her GI problems. She has occasional HAs, not new.  Hashimoto's hypothyroidism: Pt. has been dx with mild hypothyroidism in 2017.   Pt. is on levothyroxine  50 mcg daily, taken: - in am - fasting - at least 30 min from b'fast - no calcium - + iron - started 3 weeks ago, taken in the evening - no multivitamins - no PPIs - not on Biotin  I reviewed pt's thyroid  tests:  Lab Results  Component Value Date   TSH 1.29 12/31/2022   TSH 1.14 07/16/2017   TSH 2.04 10/29/2016   TSH 0.96 11/08/2015   TSH 5.19 (H) 09/09/2015   TSH 4.57 (H) 07/24/2015   TSH 2.60 11/19/2014   TSH 2.863 06/30/2014   FREET4 1.1 12/31/2022   FREET4 0.71 07/16/2017   FREET4 0.85  10/29/2016   FREET4 0.69 11/08/2015   FREET4 0.66 09/09/2015   FREET4 0.69 07/24/2015   FREET4 0.72 11/19/2014   T3FREE 2.5 09/09/2015   T3FREE 2.3 07/24/2015   T3FREE 2.7 11/19/2014    07/21/2017: TSH 2.81, Free T4 1.0 PCP increased her levothyroxine  from 25 to 50 mcg.     Antithyroid antibodies: 10/11/2014: Thyroperoxidase Ab 25 (H)    <9 IU/mL  She has no FH of thyroid  disorders. No FH of thyroid  cancer. No h/o radiation tx to head or neck. No recent use of iodine supplements.  No herbal supplements.  No recent steroid use.  Central adrenal insufficiency: -Due to previous steroid/opioids use -Resolved, last dose of steroid taken 2021  We had her on hydrocortisone  10 mg in a.m. and 5 mg in p.m. but in 2018, we checked a cosyntropin  stimulation test that returned normal so I advised her to stop hydrocortisone . She ended up restarting fentanyl  patch afterwards and in 2019 she required hydrocortisone  again, but in 2021 she was able to come off.  She is now off opiates  Reviewed and addended history: Pt. 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 (has a h/o cervical cancer and had EB and brachy radiation and chemotherapy with Cisplatin + Dexamethasone ) - 2010 >> AP >> morphine , dilaudid ; also dehydration >> hypotension >> cortisol returned low >> stim test abnormal: Component     Latest Ref Rng 06/29/2014 06/30/2014  Cortisol,  Base        1.2  Cortisol, 30 Min        1.2  Cortisol, 60 Min        1.2  Cortisol - AM     6.7 - 22.6 ug/dL 1.3 (L)    Cortisol, Plasma        0.9   Repeated stim test were still abnormal: Component     Latest Ref Rng 10/11/2014 10/11/2014 10/11/2014        10:02 AM 10:39 AM 11:10 AM  Cortisol, Plasma      5.3 10.5 12.6  C206 ACTH      6 - 50 pg/mL 6          Component     Latest Ref Rng & Units 07/24/2015 07/24/2015 07/24/2015         8:55 AM  9:29 AM  9:52 AM  Cortisol, Plasma      ug/dL 4.2 88.7 85.8    However, in 2018, a cosyntropin  stimulation test was finally normal, most likely after coming off fentanyl  patch. Component     Latest Ref Rng & Units 10/29/2016 10/29/2016 10/29/2016         9:25 AM 10:05 AM 10:44 AM  Cortisol, Plasma     ug/dL 81.5 77.2 72.5  At that time, we stopped hydrocortisone .  She had Prednisone tapers before  - last 10 years ago.  No h/o Depo-provera, Megace, po ketoconazole, phenytoin, rifampin, chronic fluconazole use.  She is on Prometrium , which should not interfere with the pituitary-adrenal axis.  + h/o autoimmune diseases in father (MG). + NSAIDs use. No h/o generalized infections or HIV. No IVDA. + h/o head injuries (concussions  - 2 due to horseback riding). No severe HA.    Pituitary MRI (01/23/2015): No pituitary dysfunction or empty sella so it was likely that her adrenal insufficiency it was related to previous opiate use Brain MRI (03/06/2015): Normal.   We checked pituitary labs which were normal except for [facility] and FSH-due to early menopause in the setting of radiation and chemotherapy for cervical cancer in 2010: Component     Latest Ref Rng & Units 11/19/2014  IGF-I, LC/MS     52 - 328 ng/mL 156  Z-Score (Female)     -2.0 - 2.0 SD 0.2  TSH     0.35 - 4.50 uIU/mL 2.60  Prolactin     ng/mL 8.9  LH     mIU/mL 39.56  FSH     mIU/ML 114.0    She continues on estradiol  patch and Prometrium .  Latest Na and K were normal:    Thyroid  nodules: In 2016, she complained of a lump in the neck-we checked a thyroid  ultrasound that showed few thyroid  nodules with the largest being 6 mm, in the left lobe.  No imaging follow-up was indicated.  However, she did have a thyroid  ultrasound that showed that the left thyroid  nodule remained stable in size and 0.6 x 0.3 cm on the latest ultrasound.  She had an enlarged lymph node in level for a, which was biopsied with benign results.  Thyroid  ultrasound (11/13/2020): Right  thyroid  lobe measures 4.6 cm x 1.1 cm x 1 cm with volume of 2.8 mL. Homogeneous echotexture. There is a small subcentimeter hypoechoic nodule measuring 2 mm x 2 mm x 4 mm inferiorly.   Isthmus: Normal in thickness. AP diameter of 0.2 cm. No cyst or mass lesion identified.    Left lobe measures 3.6 cm x  1 cm x 1.1 cm with volume of 2.2 mL. Homogeneous echotexture. No focal nodule seen.   Color-flow shows normal blood flow to the thyroid  gland.   Lymph nodes: Left inferior neck lateral to the carotid artery there is a ventricular 8 mm x 9 mm x 4 mm with volume of 0.1 mL nodule seen which does not show any fatty hilum. No other abnormal lymph nodes noted in the right of the left neck.  There is a second lymph node noted lateral to the carotid artery measuring approximately 1.1 cm x 9 mm x 7 mm with volume of 0.4 mL.   Vasculature: Visualized portions of the carotid and jugular vessels are patent bilaterally. Soft atherosclerotic plaque noted in the visualized carotid artery.   Thyroid  ultrasound (01/01/2021): Right lobe of the thyroid : Measures 1.1 cm AP x 0.8 cm transverse x 4.3 cm CC with an estimated volume of 1.9 mL.  Stable. 0.4 x 0.2 x 0.3 cm hypoechoic nodule in the inferior right thyroid  lobe (TI RADS 4).   Isthmus: Normal in thickness. No cyst or mass lesion identified.    Left lobe of the thyroid : Measures 1.0 cm AP x 0.9 cm transverse x 4.0 cm CC with an estimated volume of 1.8 mL. A somewhat ill-defined hypoechoic nodule in the inferior left thyroid  measuring 0.6 x 0.3 x 0.4 cm appears similar to prior (TI RADS 4).   Color-flow shows normal blood flow to the thyroid  gland.   Lymph nodes: Similar appearance of a rounded lymph node in the left inferior neck which measures 1.1 x 0.8 x 0.9 cm, previously 1.2 x 0.9 x 0.8 cm. An additional lymph node in the left superior neck adjacent to the carotid bifurcation measures 1.1 x 0.7 x 0.8 cm and is also likely similar to prior.   Vasculature:  Visualized portions of the carotid and jugular vessels are patent bilaterally.   IMPRESSION:  1. No significant interval change of prominent left cervical lymph nodes which are nonspecific. Further evaluation with image guided biopsy could be considered if clinically warranted.  2.  Stable bilateral subcentimeter TI RADS 4 thyroid  nodules. Attention on follow-up imaging is suggested.   FNA LN (01/28/2021): Fine needle aspiration smears and cell block from needle rinse: No malignant cells are identified Polymorphous lymphoid elements (see Comment) Cell block shows similar features.   Thyroid  U/S (01/04/2023): Parenchymal Echotexture: Mildly heterogenous  Isthmus: 0.2 cm Right lobe: 4.6 x 1.3 x 0.9 cm  Left lobe: 4.1 x 1.0 x 0.8 cm  _________________________________________________________   Estimated total number of nodules >/= 1 cm: 0 _________________________________________________________   4 mm hypoechoic nodule in the inferior right thyroid  lobe is not significantly changed in since 2016 where it measured 3 mm. Stability since 2016, which is consistent with a benign etiology. No further follow-up is needed. No new thyroid  nodule is seen. Multiple nonenlarged left submandibular lymph nodes are seen.   IMPRESSION: No significant sonographic abnormality of the thyroid .  No worrisome findings.  No further thyroid  ultrasounds needed.  She denies: - hoarseness - dysphagia - choking  Pt. also has a history of anemia, kidney stones, constipation, depression. She initially gained weight on amitriptyline  in 2016, and lost the weight afterwards, after coming off the medication.   ROS: + See HPI Also,  + Anxiety and depression  Past Medical History:  Diagnosis Date   Cancer (HCC)    Chronic constipation    SECONDARY TO RADIATION   Depression    Dyspareunia  Frequency of urination    History of cervical cancer ONCOLOGIST--  [place]   DX 2010--  S/P RADIATION  (EXTERNAL AND BRACHY) AND CHEMO SPRING 2010--  NO RECURRENCE   History of concussion    NO RESIDUAL   History of kidney stones    Mild acid reflux    Nerve pain    legs   Nocturia    Radiation cystitis    SBO (small bowel obstruction) (HCC)    Thyroid  disease    Wears glasses    Past Surgical History:  Procedure Laterality Date   BREAST BIOPSY Left 11/05/2023   US  LT BREAST BX W LOC DEV 1ST LESION IMG BX SPEC US  GUIDE 11/05/2023 [facility]   BREAST BIOPSY Right 11/05/2023   US  RT BREAST BX W LOC DEV 1ST LESION IMG BX SPEC US  GUIDE 11/05/2023 [facility]   CERVICAL CONE BIOPSY  FALL 2009   CYSTO/ BILATERAL URETEROSCOPIC STONE EXTRACTIONS AND STENT PLACEMENT  FALL 2013   CYSTOSCOPY WITH BIOPSY N/A 02/03/2013   Procedure: CYSTOSCOPY WITH BIOPSY, CAUTERIZATION OF THE BIOPSY SITES INSTILLATION OF PYRIDIUM   AND RECTAL EXAM UNDER ANESTHESIA;  Surgeon: Arlena LILLETTE Gal, MD;  Location: [facility] [place];  Service: Urology;  Laterality: N/A;   EXTRACORPOREAL SHOCK WAVE LITHOTRIPSY  SUMMER 2013   TONSILLECTOMY   FALL 2010-- LEFT SIDE   2007--- RIGHT SIDE  AND REMOVAL BENIGN CYST   Social History   Socioeconomic History   Marital status: Married    Spouse name: Not on file   Number of children: 0   Years of education: Not on file   Highest education level: Not on file  Occupational History   Occupation: legal aide attorney  Tobacco Use   Smoking status: Every Day    Current packs/day: 0.50    Average packs/day: 0.5 packs/day for 20.0 years (10.0 ttl pk-yrs)    Types: Cigarettes, E-cigarettes   Smokeless tobacco: Never   Tobacco comments:    CURRENTLY USES ECIG:  less than a cartridge a day  Vaping Use   Vaping status: Every Day   Substances: Nicotine, Flavoring  Substance and Sexual Activity   Alcohol use: No    Alcohol/week: 0.0 standard drinks of alcohol   Drug use: No   Sexual activity: Not on file  Other Topics Concern   Not on file  Social  History Narrative   Lives witih husband who had a stroke   Social Drivers of Corporate Investment Banker Strain: Not on file  Food Insecurity: Not on file  Transportation Needs: No Transportation Needs (10/26/2021)   PRAPARE - Administrator, Civil Service (Medical): No    Lack of Transportation (Non-Medical): No  Physical Activity: Not on file  Stress: Not on file  Social Connections: Not on file  Intimate Partner Violence: Not on file   Current Outpatient Medications on File Prior to Visit  Medication Sig Dispense Refill   acetaminophen  (TYLENOL ) 500 MG tablet Take 500 mg by mouth every 6 (six) hours as needed for mild pain or headache.     ADVIL 200 MG tablet Take 400 mg by mouth every 6 (six) hours as needed for headache.     amphetamine -dextroamphetamine  (ADDERALL) 10 MG tablet Take 10 mg by mouth 2 (two) times daily with a meal.     buPROPion (WELLBUTRIN XL) 300 MG 24 hr tablet Take 300 mg by mouth every morning.     diazepam  (VALIUM ) 10 MG tablet 10  mg See admin instructions. Insert 10 mg, per vagina, at bedtime as needed/as directed     doxylamine, Sleep, (UNISOM) 25 MG tablet Take 25 mg by mouth at bedtime as needed for sleep.     estradiol  (CLIMARA  - DOSED IN MG/24 HR) 0.05 mg/24hr patch Place 0.05 mg onto the skin every Wednesday.     fluticasone (FLONASE) 50 MCG/ACT nasal spray Place 1-2 sprays into both nostrils every other day.     GAS-X EXTRA STRENGTH 125 MG chewable tablet Chew 125 mg by mouth every 6 (six) hours as needed for flatulence.     hyoscyamine (ANASPAZ) 0.125 MG TBDP disintergrating tablet Hyoscyamine     lamoTRIgine  (LAMICTAL ) 100 MG tablet Take 25 mg by mouth daily.     levothyroxine  (SYNTHROID ) 50 MCG tablet Take 50 mcg by mouth every morning.     MUCINEX 600 MG 12 hr tablet Take 600 mg by mouth 2 (two) times daily as needed for to loosen phlegm or cough.     ondansetron  (ZOFRAN ) 4 MG tablet Take 1 tablet (4 mg total) by mouth every 8 (eight)  hours as needed for nausea or vomiting. 20 tablet 0   sertraline  (ZOLOFT ) 100 MG tablet Take 100 mg by mouth every morning.     No current facility-administered medications on file prior to visit.   Allergies  Allergen Reactions   Penicillins Anaphylaxis   Prilosec [Omeprazole] Other (See Comments)    Patient states that it shut down her stomach too much   Ceclor [Cefaclor] Rash   Family History  Problem Relation Age of Onset   Diabetes Mother    Fibromyalgia Mother    Cancer Father    Myasthenia gravis Father    Cancer Maternal Grandmother    Breast cancer Maternal Grandmother    Cancer - Colon Neg Hx    Esophageal cancer Neg Hx    Stomach cancer Neg Hx    Prostate cancer in father.  PE: BP 112/60   Pulse 71   Ht 5' 3 (1.6 m)   Wt 120 lb 9.6 oz (54.7 kg)   SpO2 99%   BMI 21.36 kg/m  Wt Readings from Last 3 Encounters:  01/03/24 120 lb 9.6 oz (54.7 kg)  12/31/22 120 lb (54.4 kg)  05/25/22 119 lb (54 kg)   Constitutional: normal weight, in NAD Eyes:  EOMI, no exophthalmos ENT: no neck masses palpated, no cervical lymphadenopathy Cardiovascular: RRR, No MRG Respiratory: CTA B Musculoskeletal: no deformities Skin:no rashes Neurological: no tremor with outstretched hands  ASSESSMENT: 1.  Hashimoto's hypothyroidism  2.  History of central adrenal insufficiency - Opiate induced - now off opiates  3. Thyroid  nodules  PLAN:  1. Patient with longstanding hypothyroidism, on levothyroxine  therapy. - she appears euthyroid.  - latest thyroid  labs reviewed with pt. >> normal on 04/19/2023 (see HPI) - she continues on LT4 50 mcg daily - pt feels good on this dose, without complaints of. - we discussed about taking the thyroid  hormone every day, with water , >30 minutes before breakfast, separated by >4 hours from acid reflux medications, calcium, iron, multivitamins. Pt. is taking it correctly. - will check thyroid  tests today: TSH and fT4 - If labs are abnormal,  she will need to return for repeat TFTs in 1.5 months  2.  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  Last hydrocortisone  dose was actually in 2021. - We discussed about trying to stay off the opiates if possible  3. Thyroid  nodules - Incidentally found, small, with the largest being 0.6 cm, which was not seen on the ultrasound from 10/2020 but again seen in 12/2020. We discussed that this was not worrisome.  She had a biopsy of a lymph node in 01/2021 which returned benign.  At last visit, she was worried about the lymph node and the previous nodule and wanted to have a repeat ultrasound.  We checked this after the visit and this showed only a 4 mm inferior right hypoechoic nodule, not much changed from 2016 and consistent with benign etiology.  There was no other abnormality and no need for imaging follow-up. - We discussed at this visit that she does not need any intervention or follow-up for her nodules especially as she does not have any neck compression symptoms.  Orders Placed This Encounter  Procedures   TSH   T4, free   Needs refills.  Lela Fendt, MD PhD [facility] Endocrinology

## 2024-01-04 ENCOUNTER — Ambulatory Visit: Payer: Self-pay | Admitting: Internal Medicine

## 2024-01-04 LAB — T4, FREE: Free T4: 1.2 ng/dL (ref 0.8–1.8)

## 2024-01-04 LAB — TSH: TSH: 1.83 m[IU]/L

## 2024-01-04 MED ORDER — LEVOTHYROXINE SODIUM 50 MCG PO TABS: 50.0000 ug | ORAL_TABLET | Freq: Every morning | ORAL | 3 refills | Status: AC

## 2024-01-04 NOTE — Addendum Note (Signed)
 Addended by: TRIXIE FILE on: 01/04/2024 12:40 PM   Modules accepted: Orders

## 2024-01-21 ENCOUNTER — Inpatient Hospital Stay (HOSPITAL_COMMUNITY)
Admission: EM | Admit: 2024-01-21 | Discharge: 2024-01-24 | Disposition: A | Attending: Internal Medicine | Admitting: Internal Medicine

## 2024-01-21 ENCOUNTER — Encounter (HOSPITAL_COMMUNITY): Payer: Self-pay

## 2024-01-21 ENCOUNTER — Other Ambulatory Visit: Payer: Self-pay

## 2024-01-21 ENCOUNTER — Emergency Department (HOSPITAL_COMMUNITY)

## 2024-01-21 DIAGNOSIS — D649 Anemia, unspecified: Secondary | ICD-10-CM | POA: Diagnosis not present

## 2024-01-21 DIAGNOSIS — F32A Depression, unspecified: Secondary | ICD-10-CM | POA: Diagnosis present

## 2024-01-21 DIAGNOSIS — N3289 Other specified disorders of bladder: Secondary | ICD-10-CM | POA: Diagnosis present

## 2024-01-21 DIAGNOSIS — Z87442 Personal history of urinary calculi: Secondary | ICD-10-CM | POA: Diagnosis not present

## 2024-01-21 DIAGNOSIS — F419 Anxiety disorder, unspecified: Secondary | ICD-10-CM | POA: Diagnosis present

## 2024-01-21 DIAGNOSIS — E872 Acidosis, unspecified: Secondary | ICD-10-CM | POA: Diagnosis present

## 2024-01-21 DIAGNOSIS — F17210 Nicotine dependence, cigarettes, uncomplicated: Secondary | ICD-10-CM | POA: Diagnosis present

## 2024-01-21 DIAGNOSIS — Z79891 Long term (current) use of opiate analgesic: Secondary | ICD-10-CM

## 2024-01-21 DIAGNOSIS — E2740 Unspecified adrenocortical insufficiency: Secondary | ICD-10-CM | POA: Diagnosis present

## 2024-01-21 DIAGNOSIS — E860 Dehydration: Secondary | ICD-10-CM | POA: Diagnosis present

## 2024-01-21 DIAGNOSIS — K5909 Other constipation: Secondary | ICD-10-CM | POA: Diagnosis present

## 2024-01-21 DIAGNOSIS — Z880 Allergy status to penicillin: Secondary | ICD-10-CM | POA: Diagnosis not present

## 2024-01-21 DIAGNOSIS — K219 Gastro-esophageal reflux disease without esophagitis: Secondary | ICD-10-CM | POA: Diagnosis present

## 2024-01-21 DIAGNOSIS — E039 Hypothyroidism, unspecified: Secondary | ICD-10-CM | POA: Diagnosis present

## 2024-01-21 DIAGNOSIS — Z881 Allergy status to other antibiotic agents status: Secondary | ICD-10-CM | POA: Diagnosis not present

## 2024-01-21 DIAGNOSIS — Z8541 Personal history of malignant neoplasm of cervix uteri: Secondary | ICD-10-CM

## 2024-01-21 DIAGNOSIS — N3040 Irradiation cystitis without hematuria: Secondary | ICD-10-CM | POA: Diagnosis present

## 2024-01-21 DIAGNOSIS — Z923 Personal history of irradiation: Secondary | ICD-10-CM

## 2024-01-21 DIAGNOSIS — Z9221 Personal history of antineoplastic chemotherapy: Secondary | ICD-10-CM

## 2024-01-21 DIAGNOSIS — K56609 Unspecified intestinal obstruction, unspecified as to partial versus complete obstruction: Principal | ICD-10-CM | POA: Diagnosis present

## 2024-01-21 DIAGNOSIS — K56600 Partial intestinal obstruction, unspecified as to cause: Secondary | ICD-10-CM | POA: Diagnosis present

## 2024-01-21 DIAGNOSIS — Z79890 Hormone replacement therapy (postmenopausal): Secondary | ICD-10-CM | POA: Diagnosis not present

## 2024-01-21 DIAGNOSIS — F909 Attention-deficit hyperactivity disorder, unspecified type: Secondary | ICD-10-CM | POA: Diagnosis present

## 2024-01-21 DIAGNOSIS — Z79899 Other long term (current) drug therapy: Secondary | ICD-10-CM | POA: Diagnosis not present

## 2024-01-21 DIAGNOSIS — F17290 Nicotine dependence, other tobacco product, uncomplicated: Secondary | ICD-10-CM | POA: Diagnosis present

## 2024-01-21 LAB — URINALYSIS, ROUTINE W REFLEX MICROSCOPIC
Bacteria, UA: NONE SEEN
Bilirubin Urine: NEGATIVE
Glucose, UA: NEGATIVE mg/dL
Hgb urine dipstick: NEGATIVE
Ketones, ur: 80 mg/dL — AB
Leukocytes,Ua: NEGATIVE
Nitrite: NEGATIVE
Protein, ur: 30 mg/dL — AB
Specific Gravity, Urine: 1.029 (ref 1.005–1.030)
pH: 5 (ref 5.0–8.0)

## 2024-01-21 LAB — CBC
HCT: 42.5 % (ref 36.0–46.0)
Hemoglobin: 14 g/dL (ref 12.0–15.0)
MCH: 31 pg (ref 26.0–34.0)
MCHC: 32.9 g/dL (ref 30.0–36.0)
MCV: 94.2 fL (ref 80.0–100.0)
Platelets: 394 K/uL (ref 150–400)
RBC: 4.51 MIL/uL (ref 3.87–5.11)
RDW: 13.2 % (ref 11.5–15.5)
WBC: 9.4 K/uL (ref 4.0–10.5)
nRBC: 0 % (ref 0.0–0.2)

## 2024-01-21 LAB — LIPASE, BLOOD: Lipase: 18 U/L (ref 11–51)

## 2024-01-21 LAB — COMPREHENSIVE METABOLIC PANEL WITH GFR
ALT: 19 U/L (ref 0–44)
AST: 23 U/L (ref 15–41)
Albumin: 4.8 g/dL (ref 3.5–5.0)
Alkaline Phosphatase: 67 U/L (ref 38–126)
Anion gap: 14 (ref 5–15)
BUN: 19 mg/dL (ref 6–20)
CO2: 25 mmol/L (ref 22–32)
Calcium: 9.8 mg/dL (ref 8.9–10.3)
Chloride: 100 mmol/L (ref 98–111)
Creatinine, Ser: 0.72 mg/dL (ref 0.44–1.00)
GFR, Estimated: >60 mL/min
Glucose, Bld: 114 mg/dL — ABNORMAL HIGH (ref 70–99)
Potassium: 4.4 mmol/L (ref 3.5–5.1)
Sodium: 138 mmol/L (ref 135–145)
Total Bilirubin: 0.4 mg/dL (ref 0.0–1.2)
Total Protein: 7.5 g/dL (ref 6.5–8.1)

## 2024-01-21 LAB — HCG, SERUM, QUALITATIVE: Preg, Serum: NEGATIVE

## 2024-01-21 MED ORDER — MORPHINE SULFATE (PF) 4 MG/ML IV SOLN
4.0000 mg | Freq: Once | INTRAVENOUS | Status: AC
  Administered 2024-01-21: 4 mg via INTRAVENOUS
  Filled 2024-01-21: qty 1

## 2024-01-21 MED ORDER — ACETAMINOPHEN 650 MG RE SUPP: 650.0000 mg | Freq: Four times a day (QID) | RECTAL | Status: DC | PRN

## 2024-01-21 MED ORDER — ACETAMINOPHEN 325 MG PO TABS
650.0000 mg | ORAL_TABLET | Freq: Four times a day (QID) | ORAL | Status: DC | PRN
  Administered 2024-01-22 – 2024-01-23 (×2): 650 mg via ORAL
  Filled 2024-01-21 (×2): qty 2

## 2024-01-21 MED ORDER — LACTATED RINGERS IV SOLN: INTRAVENOUS | Status: AC

## 2024-01-21 MED ORDER — ONDANSETRON HCL 4 MG/2ML IJ SOLN
4.0000 mg | Freq: Once | INTRAMUSCULAR | Status: AC
  Administered 2024-01-21: 4 mg via INTRAVENOUS
  Filled 2024-01-21: qty 2

## 2024-01-21 MED ORDER — LACTATED RINGERS IV BOLUS
1000.0000 mL | Freq: Once | INTRAVENOUS | Status: AC
  Administered 2024-01-21: 1000 mL via INTRAVENOUS

## 2024-01-21 MED ORDER — BISACODYL 10 MG RE SUPP: 10.0000 mg | Freq: Every day | RECTAL | Status: DC | PRN

## 2024-01-21 MED ORDER — IOHEXOL 300 MG/ML  SOLN
100.0000 mL | Freq: Once | INTRAMUSCULAR | Status: AC | PRN
  Administered 2024-01-21: 100 mL via INTRAVENOUS

## 2024-01-21 MED ORDER — ENOXAPARIN SODIUM 30 MG/0.3ML IJ SOSY: 30.0000 mg | PREFILLED_SYRINGE | INTRAMUSCULAR | Status: DC

## 2024-01-21 MED ORDER — SODIUM CHLORIDE 0.9 % IV BOLUS
1000.0000 mL | Freq: Once | INTRAVENOUS | Status: AC
  Administered 2024-01-21: 1000 mL via INTRAVENOUS

## 2024-01-21 MED ORDER — ONDANSETRON HCL 4 MG/2ML IJ SOLN: 4.0000 mg | Freq: Four times a day (QID) | INTRAMUSCULAR | Status: DC | PRN

## 2024-01-21 MED ORDER — SODIUM CHLORIDE 0.9% FLUSH
3.0000 mL | Freq: Two times a day (BID) | INTRAVENOUS | Status: DC
  Administered 2024-01-21 – 2024-01-24 (×5): 3 mL via INTRAVENOUS

## 2024-01-21 MED ORDER — HYDROMORPHONE HCL 1 MG/ML IJ SOLN
0.5000 mg | INTRAMUSCULAR | Status: DC | PRN
  Administered 2024-01-22 – 2024-01-24 (×3): 0.5 mg via INTRAVENOUS
  Filled 2024-01-21 (×2): qty 0.5

## 2024-01-21 MED ORDER — ONDANSETRON HCL 4 MG PO TABS
4.0000 mg | ORAL_TABLET | Freq: Four times a day (QID) | ORAL | Status: DC | PRN
  Administered 2024-01-23 – 2024-01-24 (×2): 4 mg via ORAL
  Filled 2024-01-21: qty 1

## 2024-01-21 NOTE — Consult Note (Signed)
 Reason for Consult:vomiting Referring Physician: Dr. Arthea Rosina Hummer is an 48 y.o. female.  HPI: The patient is a 48 year old white female who presents with abdominal pain that started last night after eating some undercooked vegetables.  She had some episodes of nausea and vomiting.  She came to the emergency department where a CT scan was consistent with a small bowel obstruction.  She does have a history of cervical cancer that was treated with radiation.  She states that she has bowel obstructions about every 2 years that put her in the hospital.  She has not had to have surgery for this yet.  Past Medical History:  Diagnosis Date   Cancer (HCC)    Chronic constipation    SECONDARY TO RADIATION   Depression    Dyspareunia    Frequency of urination    History of cervical cancer ONCOLOGIST--  [place]   DX 2010--  S/P RADIATION (EXTERNAL AND BRACHY) AND CHEMO SPRING 2010--  NO RECURRENCE   History of concussion    NO RESIDUAL   History of kidney stones    Mild acid reflux    Nerve pain    legs   Nocturia    Radiation cystitis    SBO (small bowel obstruction) (HCC)    Thyroid  disease    Wears glasses     Past Surgical History:  Procedure Laterality Date   BREAST BIOPSY Left 11/05/2023   US  LT BREAST BX W LOC DEV 1ST LESION IMG BX SPEC US  GUIDE 11/05/2023 [facility]   BREAST BIOPSY Right 11/05/2023   US  RT BREAST BX W LOC DEV 1ST LESION IMG BX SPEC US  GUIDE 11/05/2023 [facility]   CERVICAL CONE BIOPSY  FALL 2009   CYSTO/ BILATERAL URETEROSCOPIC STONE EXTRACTIONS AND STENT PLACEMENT  FALL 2013   CYSTOSCOPY WITH BIOPSY N/A 02/03/2013   Procedure: CYSTOSCOPY WITH BIOPSY, CAUTERIZATION OF THE BIOPSY SITES INSTILLATION OF PYRIDIUM   AND RECTAL EXAM UNDER ANESTHESIA;  Surgeon: Arlena LILLETTE Gal, MD;  Location: [facility];  Service: Urology;  Laterality: N/A;   EXTRACORPOREAL SHOCK WAVE LITHOTRIPSY  SUMMER 2013   TONSILLECTOMY   FALL  2010-- LEFT SIDE   2007--- RIGHT SIDE  AND REMOVAL BENIGN CYST    Family History  Problem Relation Age of Onset   Diabetes Mother    Fibromyalgia Mother    Cancer Father    Myasthenia gravis Father    Cancer Maternal Grandmother    Breast cancer Maternal Grandmother    Cancer - Colon Neg Hx    Esophageal cancer Neg Hx    Stomach cancer Neg Hx     Social History:  reports that she has been smoking cigarettes and e-cigarettes. She has a 10 pack-year smoking history. She has never used smokeless tobacco. She reports that she does not drink alcohol and does not use drugs.  Allergies: Allergies[1]  Medications: I have reviewed the patient's current medications.  Results for orders placed or performed during the hospital encounter of 01/21/24 (from the past 48 hours)  Lipase, blood     Status: None   Collection Time: 01/21/24  3:15 PM  Result Value Ref Range   Lipase 18 11 - 51 U/L    Comment: Performed at [facility], [address], [place], [place] [address]  Comprehensive metabolic panel     Status: Abnormal   Collection Time: 01/21/24  3:15 PM  Result Value Ref Range   Sodium 138 135 - 145 mmol/L  Potassium 4.4 3.5 - 5.1 mmol/L   Chloride 100 98 - 111 mmol/L   CO2 25 22 - 32 mmol/L   Glucose, Bld 114 (H) 70 - 99 mg/dL    Comment: Glucose reference range applies only to samples taken after fasting for at least 8 hours.   BUN 19 6 - 20 mg/dL   Creatinine, Ser 9.27 0.44 - 1.00 mg/dL   Calcium 9.8 8.9 - 89.6 mg/dL   Total Protein 7.5 6.5 - 8.1 g/dL   Albumin 4.8 3.5 - 5.0 g/dL   AST 23 15 - 41 U/L   ALT 19 0 - 44 U/L   Alkaline Phosphatase 67 38 - 126 U/L   Total Bilirubin 0.4 0.0 - 1.2 mg/dL   GFR, Estimated >39 >39 mL/min    Comment: (NOTE) Calculated using the CKD-EPI Creatinine Equation (2021)    Anion gap 14 5 - 15    Comment: Performed at [facility], [address] [place], [place] [address]  CBC     Status: None    Collection Time: 01/21/24  3:15 PM  Result Value Ref Range   WBC 9.4 4.0 - 10.5 K/uL   RBC 4.51 3.87 - 5.11 MIL/uL   Hemoglobin 14.0 12.0 - 15.0 g/dL   HCT 57.4 63.9 - 53.9 %   MCV 94.2 80.0 - 100.0 fL   MCH 31.0 26.0 - 34.0 pg   MCHC 32.9 30.0 - 36.0 g/dL   RDW 86.7 88.4 - 84.4 %   Platelets 394 150 - 400 K/uL   nRBC 0.0 0.0 - 0.2 %    Comment: Performed at [facility], [address], [place], [place] [address]  Urinalysis, Routine w reflex microscopic -Urine, Clean Catch     Status: Abnormal   Collection Time: 01/21/24  3:15 PM  Result Value Ref Range   Color, Urine YELLOW YELLOW   APPearance HAZY (A) CLEAR   Specific Gravity, Urine 1.029 1.005 - 1.030   pH 5.0 5.0 - 8.0   Glucose, UA NEGATIVE NEGATIVE mg/dL   Hgb urine dipstick NEGATIVE NEGATIVE   Bilirubin Urine NEGATIVE NEGATIVE   Ketones, ur 80 (A) NEGATIVE mg/dL   Protein, ur 30 (A) NEGATIVE mg/dL   Nitrite NEGATIVE NEGATIVE   Leukocytes,Ua NEGATIVE NEGATIVE   RBC / HPF 11-20 0 - 5 RBC/hpf   WBC, UA 0-5 0 - 5 WBC/hpf   Bacteria, UA NONE SEEN NONE SEEN   Squamous Epithelial / HPF 0-5 0 - 5 /HPF   Mucus PRESENT     Comment: Performed at [facility], [address], [place], [place] [address]  hCG, serum, qualitative     Status: None   Collection Time: 01/21/24  3:15 PM  Result Value Ref Range   Preg, Serum NEGATIVE NEGATIVE    Comment:        THE SENSITIVITY OF THIS METHODOLOGY IS >10 mIU/mL. Performed at [facility], [address], [place], [place] [address]     CT ABDOMEN PELVIS W CONTRAST Result Date: 01/21/2024 EXAM: CT ABDOMEN AND PELVIS WITH CONTRAST 01/21/2024 05:23:57 PM TECHNIQUE: CT of the abdomen and pelvis was performed with the administration of intravenous contrast. Multiplanar reformatted images are provided for review. Automated exposure control, iterative reconstruction, and/or weight-based adjustment of the mA/kV was utilized to reduce  the radiation dose to as low as reasonably achievable. COMPARISON: CT abdomen and pelvis 10/25/2021. CLINICAL HISTORY: Bowel obstruction suspected. FINDINGS: LOWER CHEST: No acute abnormality.  LIVER: The liver is unremarkable. GALLBLADDER AND BILE DUCTS: The gallbladder is surgically absent. No biliary ductal dilatation. SPLEEN: No acute abnormality. PANCREAS: No acute abnormality. ADRENAL GLANDS: No acute abnormality. KIDNEYS, URETERS AND BLADDER: No stones in the kidneys or ureters. No hydronephrosis. No perinephric or periureteral stranding. Urinary bladder is unremarkable. GI AND BOWEL: Moderate air fluid level in the stomach. There are numerous dilated mid and distal small bowel loops with associated mesenteric edema. A peritransition point is seen in the lower central abdomen on image 2/47. Jejunum and colon are nondilated. There is sigmoid colon diverticulosis. The appendix is not visualized. No pneumatosis identified. PERITONEUM AND RETROPERITONEUM: Trace free fluid in the pelvis. No free air. VASCULATURE: Aorta is normal in caliber. LYMPH NODES: No lymphadenopathy. REPRODUCTIVE ORGANS: Surgical clips in the region of the cervix. BONES AND SOFT TISSUES: No acute osseous abnormality. No focal soft tissue abnormality. IMPRESSION: 1. Small bowel obstruction with transition point in the lower central abdomen. 2. Moderate gastric air-fluid level. 3. Sigmoid colon diverticulosis without diverticulitis. 4. Trace free fluid in the pelvis. Electronically signed by: Greig Pique MD 01/21/2024 06:58 PM EST RP Workstation: [id number]    Review of Systems  Constitutional: Negative.   HENT: Negative.    Eyes: Negative.   Respiratory: Negative.    Cardiovascular: Negative.   Gastrointestinal:  Positive for abdominal pain, nausea and vomiting.  Endocrine: Negative.   Genitourinary: Negative.   Musculoskeletal: Negative.   Skin: Negative.   Allergic/Immunologic: Negative.   Neurological: Negative.    Hematological: Negative.   Psychiatric/Behavioral: Negative.     Blood pressure 109/71, pulse 78, temperature 98.5 F (36.9 C), resp. rate 16, height 5' 3 (1.6 m), weight 54.4 kg, SpO2 100%. Physical Exam Vitals reviewed.  Constitutional:      General: She is not in acute distress.    Appearance: Normal appearance.  HENT:     Head: Normocephalic and atraumatic.     Right Ear: External ear normal.     Left Ear: External ear normal.     Nose: Nose normal.     Mouth/Throat:     Mouth: Mucous membranes are moist.     Pharynx: Oropharynx is clear.  Eyes:     General: No scleral icterus.    Extraocular Movements: Extraocular movements intact.     Conjunctiva/sclera: Conjunctivae normal.     Pupils: Pupils are equal, round, and reactive to light.  Cardiovascular:     Rate and Rhythm: Normal rate and regular rhythm.     Pulses: Normal pulses.     Heart sounds: Normal heart sounds.  Pulmonary:     Effort: Pulmonary effort is normal.     Breath sounds: Normal breath sounds.  Abdominal:     General: Abdomen is flat.     Palpations: Abdomen is soft.     Tenderness: There is no abdominal tenderness.  Musculoskeletal:        General: No swelling or deformity. Normal range of motion.     Cervical back: Normal range of motion and neck supple.  Skin:    General: Skin is warm and dry.     Coloration: Skin is not jaundiced.  Neurological:     General: No focal deficit present.     Mental Status: She is alert and oriented to person, place, and time.  Psychiatric:        Mood and Affect: Mood normal.        Behavior: Behavior normal.     Assessment/Plan: The patient  appears to have a small bowel obstruction although her abdomen is very soft and flat with no distention.  Because of this we will hold off on putting an NG tube in.  If she vomits again then she may need an NG tube.  If she does well overnight then we will start the small bowel protocol orally in the morning and monitor  her closely.  Sue Benson 01/21/2024, 10:44 PM         [1]  Allergies Allergen Reactions   Penicillins Anaphylaxis, Swelling, Dermatitis and Other (See Comments)    Swelling was all over, as was redness of the skin   Omeprazole Hives and Other (See Comments)    Patient states that it shut down her stomach too much- a lot of cramping   Cefaclor Rash

## 2024-01-21 NOTE — ED Provider Notes (Signed)
 " [place] EMERGENCY DEPARTMENT AT [facility] Provider Note  CSN: [id number] Arrival date & time: 01/21/24 1450  Chief Complaint(s) Abdominal Pain  HPI Sue Benson is a 48 y.o. female who is here today for right-sided abdominal pain.  Patient states symptoms started last evening.  Says she has a history of small bowel obstructions, please to be secondary to cervical cancer which required radiation.  States has been 2 years since she Had a small bowel obstruction.   Past Medical History Past Medical History:  Diagnosis Date   Cancer (HCC)    Chronic constipation    SECONDARY TO RADIATION   Depression    Dyspareunia    Frequency of urination    History of cervical cancer ONCOLOGIST--  [place]   DX 2010--  S/P RADIATION (EXTERNAL AND BRACHY) AND CHEMO SPRING 2010--  NO RECURRENCE   History of concussion    NO RESIDUAL   History of kidney stones    Mild acid reflux    Nerve pain    legs   Nocturia    Radiation cystitis    SBO (small bowel obstruction) (HCC)    Thyroid  disease    Wears glasses    Patient Active Problem List   Diagnosis Date Noted   Normocytic anemia 10/28/2021   Anxiety 10/26/2021   Acquired hypothyroidism 10/28/2020   Dilated bile duct 10/28/2020   Thyroid  nodule 11/27/2014   Hashimoto's thyroiditis 11/27/2014   Long term prescription opiate use 11/13/2014   Adrenal insufficiency (HCC) 06/30/2014   Anemia of chronic disease 06/30/2014   Chronic pelvic pain in female 02/26/2014   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 Authorizing Provider  acetaminophen  (TYLENOL ) 500 MG tablet Take 500 mg by mouth every 6 (six) hours as needed for mild pain or headache.    [provider]  ADVIL 200 MG tablet Take 400 mg by mouth every 6 (six) hours as needed for headache.    [provider]  amphetamine -dextroamphetamine  (ADDERALL) 10 MG tablet Take 10 mg by mouth 2 (two) times daily with a meal.    [provider]  buPROPion (WELLBUTRIN XL) 300 MG 24 hr tablet Take 300 mg by mouth every morning. 11/19/20   [provider]  diazepam  (VALIUM ) 10 MG tablet 10 mg See admin instructions. Insert 10 mg, per vagina, at bedtime as needed/as directed 10/23/21   [provider]  doxylamine, Sleep, (UNISOM) 25 MG tablet Take 25 mg by mouth at bedtime as needed for sleep. Patient not taking: Reported on 01/03/2024    [provider]  estradiol  (CLIMARA  - DOSED IN MG/24 HR) 0.05 mg/24hr patch Place 0.05 mg onto the skin every Wednesday.    [provider]  fluticasone (FLONASE) 50 MCG/ACT nasal spray Place 1-2 sprays into both nostrils every other day.    [provider]  GAS-X EXTRA STRENGTH 125 MG chewable tablet Chew 125 mg by mouth every 6 (six) hours as needed for flatulence.    [provider]  hyoscyamine LOVETTA)  0.125 MG TBDP disintergrating tablet Hyoscyamine    [provider]  lamoTRIgine  (LAMICTAL ) 100 MG tablet Take 25 mg by mouth daily. Patient not taking: Reported on 01/03/2024 11/07/20   [provider]  levothyroxine  (SYNTHROID ) 50 MCG tablet Take 1 tablet (50 mcg total) by mouth every morning. 01/04/24   Trixie File, MD  MUCINEX 600 MG 12 hr tablet Take 600 mg by mouth 2 (two) times daily as needed for to loosen phlegm or cough.    [provider]  ondansetron  (ZOFRAN ) 4 MG tablet Take 1 tablet (4 mg total) by mouth every 8  (eight) hours as needed for nausea or vomiting. 12/23/20   Nivia Colon, PA-C  sertraline  (ZOLOFT ) 100 MG tablet Take 100 mg by mouth every morning. 10/22/21   [provider]                                                                                                                                    Past Surgical History Past Surgical History:  Procedure Laterality Date   BREAST BIOPSY Left 11/05/2023   US  LT BREAST BX W LOC DEV 1ST LESION IMG BX SPEC US  GUIDE 11/05/2023 [facility]   BREAST BIOPSY Right 11/05/2023   US  RT BREAST BX W LOC DEV 1ST LESION IMG BX SPEC US  GUIDE 11/05/2023 [facility]   CERVICAL CONE BIOPSY  FALL 2009   CYSTO/ BILATERAL URETEROSCOPIC STONE EXTRACTIONS AND STENT PLACEMENT  FALL 2013   CYSTOSCOPY WITH BIOPSY N/A 02/03/2013   Procedure: CYSTOSCOPY WITH BIOPSY, CAUTERIZATION OF THE BIOPSY SITES INSTILLATION OF PYRIDIUM   AND RECTAL EXAM UNDER ANESTHESIA;  Surgeon: Arlena LILLETTE Gal, MD;  Location: [facility] ;  Service: Urology;  Laterality: N/A;   EXTRACORPOREAL SHOCK WAVE LITHOTRIPSY  SUMMER 2013   TONSILLECTOMY   FALL 2010-- LEFT SIDE   2007--- RIGHT SIDE  AND REMOVAL BENIGN CYST   Family History Family History  Problem Relation Age of Onset   Diabetes Mother    Fibromyalgia Mother    Cancer Father    Myasthenia gravis Father    Cancer Maternal Grandmother    Breast cancer Maternal Grandmother    Cancer - Colon Neg Hx    Esophageal cancer Neg Hx    Stomach cancer Neg Hx     Social History Social History[1] Allergies Penicillins, Prilosec [omeprazole], and Ceclor [cefaclor]  Review of Systems Review of Systems  Physical Exam Vital Signs  I have reviewed the triage vital signs BP 109/71   Pulse 78   Temp 98.3 F (36.8 C) (Oral)   Resp 16   Ht 5' 3 (1.6 m)   Wt 54.4 kg   SpO2 100%   BMI 21.26 kg/m   Physical Exam Vitals and nursing note reviewed.  Cardiovascular:     Rate and Rhythm: Normal  rate.  Abdominal:     General: Abdomen is flat.     Palpations:  Abdomen is soft.     Tenderness: There is no abdominal tenderness.  Skin:    General: Skin is warm.  Neurological:     General: No focal deficit present.     Mental Status: She is alert.     ED Results and Treatments Labs (all labs ordered are listed, but only abnormal results are displayed) Labs Reviewed  COMPREHENSIVE METABOLIC PANEL WITH GFR - Abnormal; Notable for the following components:      Result Value   Glucose, Bld 114 (*)    All other components within normal limits  URINALYSIS, ROUTINE W REFLEX MICROSCOPIC - Abnormal; Notable for the following components:   APPearance HAZY (*)    Ketones, ur 80 (*)    Protein, ur 30 (*)    All other components within normal limits  LIPASE, BLOOD  CBC  HCG, SERUM, QUALITATIVE                                                                                                                          Radiology CT ABDOMEN PELVIS W CONTRAST Result Date: 01/21/2024 EXAM: CT ABDOMEN AND PELVIS WITH CONTRAST 01/21/2024 05:23:57 PM TECHNIQUE: CT of the abdomen and pelvis was performed with the administration of intravenous contrast. Multiplanar reformatted images are provided for review. Automated exposure control, iterative reconstruction, and/or weight-based adjustment of the mA/kV was utilized to reduce the radiation dose to as low as reasonably achievable. COMPARISON: CT abdomen and pelvis 10/25/2021. CLINICAL HISTORY: Bowel obstruction suspected. FINDINGS: LOWER CHEST: No acute abnormality. LIVER: The liver is unremarkable. GALLBLADDER AND BILE DUCTS: The gallbladder is surgically absent. No biliary ductal dilatation. SPLEEN: No acute abnormality. PANCREAS: No acute abnormality. ADRENAL GLANDS: No acute abnormality. KIDNEYS, URETERS AND BLADDER: No stones in the kidneys or ureters. No hydronephrosis. No perinephric or periureteral stranding. Urinary bladder is unremarkable. GI AND  BOWEL: Moderate air fluid level in the stomach. There are numerous dilated mid and distal small bowel loops with associated mesenteric edema. A peritransition point is seen in the lower central abdomen on image 2/47. Jejunum and colon are nondilated. There is sigmoid colon diverticulosis. The appendix is not visualized. No pneumatosis identified. PERITONEUM AND RETROPERITONEUM: Trace free fluid in the pelvis. No free air. VASCULATURE: Aorta is normal in caliber. LYMPH NODES: No lymphadenopathy. REPRODUCTIVE ORGANS: Surgical clips in the region of the cervix. BONES AND SOFT TISSUES: No acute osseous abnormality. No focal soft tissue abnormality. IMPRESSION: 1. Small bowel obstruction with transition point in the lower central abdomen. 2. Moderate gastric air-fluid level. 3. Sigmoid colon diverticulosis without diverticulitis. 4. Trace free fluid in the pelvis. Electronically signed by: Greig Pique MD 01/21/2024 06:58 PM EST RP Workstation: [id number]    Pertinent labs & imaging results that were available during my care of the patient were reviewed by me and considered in my medical decision making (see MDM for details).  Medications Ordered in ED Medications  ondansetron  (ZOFRAN ) injection 4 mg (has no administration in time range)  ondansetron  (ZOFRAN ) injection 4 mg (4 mg Intravenous Given 01/21/24 1631)  sodium chloride  0.9 % bolus 1,000 mL (0 mLs Intravenous Stopped 01/21/24 2000)  morphine  (PF) 4 MG/ML injection 4 mg (4 mg Intravenous Given 01/21/24 1631)  iohexol  (OMNIPAQUE ) 300 MG/ML solution 100 mL (100 mLs Intravenous Contrast Given 01/21/24 1714)  morphine  (PF) 4 MG/ML injection 4 mg (4 mg Intravenous Given 01/21/24 2000)                                                                                                                                     Procedures Procedures  (including critical care time)  Medical Decision Making / ED Course   This patient presents to the ED for  concern of abdominal pain, this involves an extensive number of treatment options, and is a complaint that carries with it a high risk of complications and morbidity.  The differential diagnosis includes small bowel obstruction, enteritis, gastritis, appendicitis, malignancy.  MDM: On exam, patient overall well-appearing.  She has a soft abdomen.  Vital signs overall reassuring.  She is not having any vomiting at this time.  Will obtain imaging the patient's abdomen pelvis.  Analgesia and antiemetics ordered.  Reassessment 8:10 PM-patient with small bowel obstruction.  She is declining NG tube which I think is reasonable as she is not vomiting.  Spoke with Dr. Lenton from general surgery and they will round on the patient the morning.  Admitted to hospitalist.   Additional history obtained: -Additional history obtained from family at bedside -External records from outside source obtained and reviewed including: Chart review including previous notes, labs, imaging, consultation notes   Lab Tests: -I ordered, reviewed, and interpreted labs.   The pertinent results include:   Labs Reviewed  COMPREHENSIVE METABOLIC PANEL WITH GFR - Abnormal; Notable for the following components:      Result Value   Glucose, Bld 114 (*)    All other components within normal limits  URINALYSIS, ROUTINE W REFLEX MICROSCOPIC - Abnormal; Notable for the following components:   APPearance HAZY (*)    Ketones, ur 80 (*)    Protein, ur 30 (*)    All other components within normal limits  LIPASE, BLOOD  CBC  HCG, SERUM, QUALITATIVE       Imaging Studies ordered: I ordered imaging studies including CT abdomen pelvis I independently visualized and interpreted imaging. I agree with the radiologist interpretation   Medicines ordered and prescription drug management: Meds ordered this encounter  Medications   ondansetron  (ZOFRAN ) injection 4 mg   sodium chloride  0.9 % bolus 1,000 mL   morphine  (PF) 4 MG/ML  injection 4 mg   iohexol  (OMNIPAQUE ) 300 MG/ML solution 100 mL   morphine  (PF) 4 MG/ML injection 4 mg   ondansetron  (ZOFRAN ) injection 4 mg     Cardiac Monitoring: The patient was maintained on a cardiac monitor.  I personally viewed and interpreted the cardiac  monitored which showed an underlying rhythm of: Normal sinus rhythm   Reevaluation: After the interventions noted above, I reevaluated the patient and found that they have :improved  Co morbidities that complicate the patient evaluation  Past Medical History:  Diagnosis Date   Cancer (HCC)    Chronic constipation    SECONDARY TO RADIATION   Depression    Dyspareunia    Frequency of urination    History of cervical cancer ONCOLOGIST--  [place]   DX 2010--  S/P RADIATION (EXTERNAL AND BRACHY) AND CHEMO SPRING 2010--  NO RECURRENCE   History of concussion    NO RESIDUAL   History of kidney stones    Mild acid reflux    Nerve pain    legs   Nocturia    Radiation cystitis    SBO (small bowel obstruction) (HCC)    Thyroid  disease    Wears glasses        Final Clinical Impression(s) / ED Diagnoses Final diagnoses:  Small bowel obstruction (HCC)     @PCDICTATION @     [1]  Social History Tobacco Use   Smoking status: Every Day    Current packs/day: 0.50    Average packs/day: 0.5 packs/day for 20.0 years (10.0 ttl pk-yrs)    Types: Cigarettes, E-cigarettes   Smokeless tobacco: Never   Tobacco comments:    CURRENTLY USES ECIG:  less than a cartridge a day  Vaping Use   Vaping status: Every Day   Substances: Nicotine, Flavoring  Substance Use Topics   Alcohol use: No    Alcohol/week: 0.0 standard drinks of alcohol   Drug use: No     Mannie Pac T, DO 01/21/24 2012  "

## 2024-01-21 NOTE — H&P (Signed)
 " History and Physical    Patient: Sue Benson FMW:[id number] DOB: [birth year] DOA: 01/21/2024 DOS: the patient was seen and examined on 01/21/2024 PCP: Larnell Hamilton, MD  Patient coming from: Home  Chief Complaint:  Chief Complaint  Patient presents with   Abdominal Pain   HPI: Sue Benson is a 48 y.o. female with medical history significant for hypothyroidism, chronic pain, ADHD, depression, and cervical cancer treated by radiation and chemo in 2010.  This has been complicated by ongoing radiation cystitis and enteritis and recurrent SBO's. Her last SBO was 2 years ago.  They usually resolve with conservative therapy.  She was never required surgery for the SBO's.  She did require an NG tube only once. Yesterday about an hour after eating she developed some painful gas and bloating.  This happens periodically.  By the morning the pain was getting worse.  She also noticed that if she just had a sip of water  she had waves of pain across her abdomen.  By the afternoon she still was not able to drink anything and the pain was continuing to get worse so she knew she had another obstruction and presented to the emergency department.  She denies any fevers or chest pain or shortness of breath.  This feels like all the other times when she had small bowel obstruction. In the emergency department the patient did have a CT scan of her abdomen and pelvis which revealed a small bowel obstruction.  Her white blood cell count is normal at 9.4.  She was afebrile. She reported that she felt very dehydrated but her electrolytes are still within normal limits.  The patient will be admitted for small bowel obstruction.  The ED provider did call general surgery for consult. She has had some nausea in the emergency department but no vomiting so NG tube has not been placed.    Review of Systems: As mentioned in the history of present illness. All other systems reviewed and are negative. Past Medical  History:  Diagnosis Date   Cancer (HCC)    Chronic constipation    SECONDARY TO RADIATION   Depression    Dyspareunia    Frequency of urination    History of cervical cancer ONCOLOGIST--  [place]   DX 2010--  S/P RADIATION (EXTERNAL AND BRACHY) AND CHEMO SPRING 2010--  NO RECURRENCE   History of concussion    NO RESIDUAL   History of kidney stones    Mild acid reflux    Nerve pain    legs   Nocturia    Radiation cystitis    SBO (small bowel obstruction) (HCC)    Thyroid  disease    Wears glasses    Past Surgical History:  Procedure Laterality Date   BREAST BIOPSY Left 11/05/2023   US  LT BREAST BX W LOC DEV 1ST LESION IMG BX SPEC US  GUIDE 11/05/2023 [facility]   BREAST BIOPSY Right 11/05/2023   US  RT BREAST BX W LOC DEV 1ST LESION IMG BX SPEC US  GUIDE 11/05/2023 [facility]   CERVICAL CONE BIOPSY  FALL 2009   CYSTO/ BILATERAL URETEROSCOPIC STONE EXTRACTIONS AND STENT PLACEMENT  FALL 2013   CYSTOSCOPY WITH BIOPSY N/A 02/03/2013   Procedure: CYSTOSCOPY WITH BIOPSY, CAUTERIZATION OF THE BIOPSY SITES INSTILLATION OF PYRIDIUM   AND RECTAL EXAM UNDER ANESTHESIA;  Surgeon: Arlena LILLETTE Gal, MD;  Location: [facility] ;  Service: Urology;  Laterality: N/A;   EXTRACORPOREAL SHOCK WAVE LITHOTRIPSY  SUMMER 2013   TONSILLECTOMY  FALL 2010-- LEFT SIDE   2007--- RIGHT SIDE  AND REMOVAL BENIGN CYST   Social History:  reports that she has been smoking cigarettes and e-cigarettes. She has a 10 pack-year smoking history. She has never used smokeless tobacco. She reports that she does not drink alcohol and does not use drugs.  Allergies[1]  Family History  Problem Relation Age of Onset   Diabetes Mother    Fibromyalgia Mother    Cancer Father    Myasthenia gravis Father    Cancer Maternal Grandmother    Breast cancer Maternal Grandmother    Cancer - Colon Neg Hx    Esophageal cancer Neg Hx    Stomach cancer Neg Hx     Prior to Admission medications   Medication Sig Start Date End Date Taking? Authorizing Provider  acetaminophen  (TYLENOL ) 500 MG tablet Take 500 mg by mouth every 6 (six) hours as needed for mild pain or headache.    [provider]  ADVIL 200 MG tablet Take 400 mg by mouth every 6 (six) hours as needed for headache.    [provider]  amphetamine -dextroamphetamine  (ADDERALL) 10 MG tablet Take 10 mg by mouth 2 (two) times daily with a meal.    [provider]  buPROPion (WELLBUTRIN XL) 300 MG 24 hr tablet Take 300 mg by mouth every morning. 11/19/20   [provider]  diazepam  (VALIUM ) 10 MG tablet 10 mg See admin instructions. Insert 10 mg, per vagina, at bedtime as needed/as directed 10/23/21   [provider]  doxylamine, Sleep, (UNISOM) 25 MG tablet Take 25 mg by mouth at bedtime as needed for sleep. Patient not taking: Reported on 01/03/2024    [provider]  estradiol  (CLIMARA  - DOSED IN MG/24 HR) 0.05 mg/24hr patch Place 0.05 mg onto the skin every Wednesday.    [provider]  fluticasone (FLONASE) 50 MCG/ACT nasal spray Place 1-2 sprays into both nostrils every other day.    [provider]  GAS-X EXTRA STRENGTH 125 MG chewable tablet Chew 125 mg by mouth every 6 (six) hours as needed for flatulence.    [provider]  hyoscyamine (ANASPAZ) 0.125 MG TBDP disintergrating tablet Hyoscyamine    [provider]  lamoTRIgine  (LAMICTAL ) 100 MG tablet Take 25 mg by mouth daily. Patient not taking: Reported on 01/03/2024 11/07/20   [provider]  levothyroxine  (SYNTHROID ) 50 MCG tablet Take 1 tablet (50 mcg total) by mouth every morning. 01/04/24   Trixie File, MD  MUCINEX 600 MG 12 hr tablet Take 600 mg by mouth 2 (two) times daily as needed for to loosen phlegm or cough.    [provider]  ondansetron  (ZOFRAN ) 4 MG tablet Take 1 tablet (4 mg total) by mouth every 8 (eight) hours as needed for nausea or vomiting.  12/23/20   Nivia Colon, PA-C  sertraline  (ZOLOFT ) 100 MG tablet Take 100 mg by mouth every morning. 10/22/21   [provider]    Physical Exam: Vitals:   01/21/24 1457 01/21/24 1459 01/21/24 1803  BP: 111/70  109/71  Pulse: 78  78  Resp: 16  16  Temp: 98.1 F (36.7 C)  98.3 F (36.8 C)  TempSrc: Oral  Oral  SpO2: 98%  100%  Weight:  54.4 kg   Height:  5' 3 (1.6 m)    Physical Exam:  General: No acute distress, mal nourished, frail HEENT: Normocephalic, atraumatic, PERRL Cardiovascular: Normal rate and rhythm. Distal pulses intact. Pulmonary: Normal pulmonary  effort, normal breath sounds Gastrointestinal: Nondistended abdomen, soft, non-tender to light palpation, hypoactive bowel sounds Musculoskeletal:Normal ROM, no lower ext edema Lymphadenopathy: No cervical LAD. Skin: Skin is warm and dry. Neuro: No focal deficits noted, AAOx3. PSYCH: Attentive and cooperative  Data Reviewed:  Results for orders placed or performed during the hospital encounter of 01/21/24 (from the past 24 hours)  Lipase, blood     Status: None   Collection Time: 01/21/24  3:15 PM  Result Value Ref Range   Lipase 18 11 - 51 U/L  Comprehensive metabolic panel     Status: Abnormal   Collection Time: 01/21/24  3:15 PM  Result Value Ref Range   Sodium 138 135 - 145 mmol/L   Potassium 4.4 3.5 - 5.1 mmol/L   Chloride 100 98 - 111 mmol/L   CO2 25 22 - 32 mmol/L   Glucose, Bld 114 (H) 70 - 99 mg/dL   BUN 19 6 - 20 mg/dL   Creatinine, Ser 9.27 0.44 - 1.00 mg/dL   Calcium 9.8 8.9 - 89.6 mg/dL   Total Protein 7.5 6.5 - 8.1 g/dL   Albumin 4.8 3.5 - 5.0 g/dL   AST 23 15 - 41 U/L   ALT 19 0 - 44 U/L   Alkaline Phosphatase 67 38 - 126 U/L   Total Bilirubin 0.4 0.0 - 1.2 mg/dL   GFR, Estimated >39 >39 mL/min   Anion gap 14 5 - 15  CBC     Status: None   Collection Time: 01/21/24  3:15 PM  Result Value Ref Range   WBC 9.4 4.0 - 10.5 K/uL   RBC 4.51 3.87 - 5.11 MIL/uL   Hemoglobin 14.0 12.0 -  15.0 g/dL   HCT 57.4 63.9 - 53.9 %   MCV 94.2 80.0 - 100.0 fL   MCH 31.0 26.0 - 34.0 pg   MCHC 32.9 30.0 - 36.0 g/dL   RDW 86.7 88.4 - 84.4 %   Platelets 394 150 - 400 K/uL   nRBC 0.0 0.0 - 0.2 %  Urinalysis, Routine w reflex microscopic -Urine, Clean Catch     Status: Abnormal   Collection Time: 01/21/24  3:15 PM  Result Value Ref Range   Color, Urine YELLOW YELLOW   APPearance HAZY (A) CLEAR   Specific Gravity, Urine 1.029 1.005 - 1.030   pH 5.0 5.0 - 8.0   Glucose, UA NEGATIVE NEGATIVE mg/dL   Hgb urine dipstick NEGATIVE NEGATIVE   Bilirubin Urine NEGATIVE NEGATIVE   Ketones, ur 80 (A) NEGATIVE mg/dL   Protein, ur 30 (A) NEGATIVE mg/dL   Nitrite NEGATIVE NEGATIVE   Leukocytes,Ua NEGATIVE NEGATIVE   RBC / HPF 11-20 0 - 5 RBC/hpf   WBC, UA 0-5 0 - 5 WBC/hpf   Bacteria, UA NONE SEEN NONE SEEN   Squamous Epithelial / HPF 0-5 0 - 5 /HPF   Mucus PRESENT   hCG, serum, qualitative     Status: None   Collection Time: 01/21/24  3:15 PM  Result Value Ref Range   Preg, Serum NEGATIVE NEGATIVE     CT of abdomen pelvis IMPRESSION: 1. Small bowel obstruction with transition point in the lower central abdomen. 2. Moderate gastric air-fluid level. 3. Sigmoid colon diverticulosis without diverticulitis. 4. Trace free fluid in the pelvis.  Assessment and Plan: Small bowel obstruction - NPO, IV fluids, monitor - Symptomatic care with Zofran  and Dilaudid  - Await general surgery recommendations Constipation -the patient uses a probiotic and stool softener every day, but she  still has trouble with frequent constipation. -She says she can use MiraLAX  and sometimes she uses suppositories both of which help. Hypothyroidism - continue Synthroid  4.  Depression- Continue Zoloft    Advance Care Planning:   Code Status: Full Code  The patient names her mother is her surrogate decision maker and she wants to be full code.  Consults: General Surgery  Family Communication: The patient's mother  is at bedside  Severity of Illness: The appropriate patient status for this patient is INPATIENT. Inpatient status is judged to be reasonable and necessary in order to provide the required intensity of service to ensure the patient's safety. The patient's presenting symptoms, physical exam findings, and initial radiographic and laboratory data in the context of their chronic comorbidities is felt to place them at high risk for further clinical deterioration. Furthermore, it is not anticipated that the patient will be medically stable for discharge from the hospital within 2 midnights of admission.   * I certify that at the point of admission it is my clinical judgment that the patient will require inpatient hospital care spanning beyond 2 midnights from the point of admission due to high intensity of service, high risk for further deterioration and high frequency of surveillance required.*  Author: ARTHEA CHILD, MD 01/21/2024 9:37 PM  For on call review [email or web address].      [1]  Allergies Allergen Reactions   Penicillins Anaphylaxis, Swelling, Dermatitis and Other (See Comments)    Swelling was all over, as was redness of the skin   Omeprazole Hives and Other (See Comments)    Patient states that it shut down her stomach too much- a lot of cramping   Cefaclor Rash   "

## 2024-01-21 NOTE — ED Triage Notes (Signed)
 Pt states she gets small bowel obstructions, this feels the same. Abdominal pain that started last night after eating. C/o nausea, some vomiting last night. Pt is passing gas and had BM this morning.

## 2024-01-22 ENCOUNTER — Inpatient Hospital Stay (HOSPITAL_COMMUNITY)

## 2024-01-22 DIAGNOSIS — E2740 Unspecified adrenocortical insufficiency: Secondary | ICD-10-CM

## 2024-01-22 DIAGNOSIS — Z79891 Long term (current) use of opiate analgesic: Secondary | ICD-10-CM

## 2024-01-22 DIAGNOSIS — E039 Hypothyroidism, unspecified: Secondary | ICD-10-CM | POA: Diagnosis not present

## 2024-01-22 DIAGNOSIS — K56609 Unspecified intestinal obstruction, unspecified as to partial versus complete obstruction: Secondary | ICD-10-CM

## 2024-01-22 LAB — COMPREHENSIVE METABOLIC PANEL WITH GFR
ALT: 17 U/L (ref 0–44)
AST: 22 U/L (ref 15–41)
Albumin: 3.5 g/dL (ref 3.5–5.0)
Alkaline Phosphatase: 47 U/L (ref 38–126)
Anion gap: 13 (ref 5–15)
BUN: 12 mg/dL (ref 6–20)
CO2: 20 mmol/L — ABNORMAL LOW (ref 22–32)
Calcium: 8.5 mg/dL — ABNORMAL LOW (ref 8.9–10.3)
Chloride: 105 mmol/L (ref 98–111)
Creatinine, Ser: 0.68 mg/dL (ref 0.44–1.00)
GFR, Estimated: >60 mL/min
Glucose, Bld: 79 mg/dL (ref 70–99)
Potassium: 4 mmol/L (ref 3.5–5.1)
Sodium: 139 mmol/L (ref 135–145)
Total Bilirubin: 0.3 mg/dL (ref 0.0–1.2)
Total Protein: 5.3 g/dL — ABNORMAL LOW (ref 6.5–8.1)

## 2024-01-22 LAB — PHOSPHORUS: Phosphorus: 2.5 mg/dL (ref 2.5–4.6)

## 2024-01-22 LAB — CBC
HCT: 34.1 % — ABNORMAL LOW (ref 36.0–46.0)
Hemoglobin: 11.1 g/dL — ABNORMAL LOW (ref 12.0–15.0)
MCH: 31.3 pg (ref 26.0–34.0)
MCHC: 32.6 g/dL (ref 30.0–36.0)
MCV: 96.1 fL (ref 80.0–100.0)
Platelets: 280 K/uL (ref 150–400)
RBC: 3.55 MIL/uL — ABNORMAL LOW (ref 3.87–5.11)
RDW: 13.4 % (ref 11.5–15.5)
WBC: 9.5 K/uL (ref 4.0–10.5)
nRBC: 0 % (ref 0.0–0.2)

## 2024-01-22 LAB — HIV ANTIBODY (ROUTINE TESTING W REFLEX): HIV Screen 4th Generation wRfx: NONREACTIVE

## 2024-01-22 LAB — MAGNESIUM: Magnesium: 2.2 mg/dL (ref 1.7–2.4)

## 2024-01-22 MED ORDER — PROPRANOLOL HCL 10 MG PO TABS: 10.0000 mg | ORAL_TABLET | Freq: Every day | ORAL | Status: DC | PRN

## 2024-01-22 MED ORDER — FLUTICASONE PROPIONATE 50 MCG/ACT NA SUSP: 1.0000 | Freq: Two times a day (BID) | NASAL | Status: DC | PRN

## 2024-01-22 MED ORDER — BUPROPION HCL ER (XL) 300 MG PO TB24
300.0000 mg | ORAL_TABLET | Freq: Every morning | ORAL | Status: DC
  Administered 2024-01-23 – 2024-01-24 (×2): 300 mg via ORAL
  Filled 2024-01-22: qty 1

## 2024-01-22 MED ORDER — ENOXAPARIN SODIUM 40 MG/0.4ML IJ SOSY
40.0000 mg | PREFILLED_SYRINGE | INTRAMUSCULAR | Status: DC
  Administered 2024-01-22: 40 mg via SUBCUTANEOUS
  Filled 2024-01-22 (×2): qty 0.4

## 2024-01-22 MED ORDER — SERTRALINE HCL 100 MG PO TABS
100.0000 mg | ORAL_TABLET | Freq: Every morning | ORAL | Status: DC
  Administered 2024-01-23 – 2024-01-24 (×2): 100 mg via ORAL
  Filled 2024-01-22: qty 1

## 2024-01-22 MED ORDER — IBUPROFEN 200 MG PO TABS
600.0000 mg | ORAL_TABLET | Freq: Four times a day (QID) | ORAL | Status: DC | PRN
  Administered 2024-01-22: 600 mg via ORAL
  Filled 2024-01-22: qty 3

## 2024-01-22 MED ORDER — LEVOTHYROXINE SODIUM 50 MCG PO TABS
50.0000 ug | ORAL_TABLET | Freq: Every day | ORAL | Status: DC
  Administered 2024-01-23 – 2024-01-24 (×2): 50 ug via ORAL
  Filled 2024-01-22: qty 1

## 2024-01-22 MED ORDER — HYOSCYAMINE SULFATE 0.125 MG PO TBDP: 0.1250 mg | ORAL_TABLET | Freq: Four times a day (QID) | ORAL | Status: DC | PRN

## 2024-01-22 NOTE — Plan of Care (Signed)

## 2024-01-22 NOTE — Hospital Course (Signed)
 Sue Benson is a 48 y.o. female with medical history significant for hypothyroidism, chronic pain, ADHD, depression, and cervical cancer treated by radiation and chemo in 2010.  This has been complicated by ongoing radiation cystitis and enteritis and recurrent SBO's. Her last SBO was 2 years ago.  They usually resolve with conservative therapy.  She was never required surgery for the SBO's.  She did require an NG tube only once. Yesterday about an hour after eating she developed some painful gas and bloating.  This happens periodically.  By the morning the pain was getting worse.  She also noticed that if she just had a sip of water  she had waves of pain across her abdomen.  By the afternoon she still was not able to drink anything and the pain was continuing to get worse so she knew she had another obstruction and presented to the emergency department.  She denies any fevers or chest pain or shortness of breath.  This feels like all the other times when she had small bowel obstruction.  In the emergency department the patient did have a CT scan of her abdomen and pelvis which revealed a small bowel obstruction.  Her white blood cell count is normal at 9.4.  She was afebrile. She reported that she felt very dehydrated but her electrolytes are still within normal limits.  She was admitted for small bowel obstruction and Surgery consulted and she is slowly improving and diet is advancing.    She is now on a Soft Diet but having some Nausea so will continue to Monitor overnight. Additionally had only a small bowel movement   Assessment and Plan:  SBO in the setting of Radiation: Has had multiple episodes of small bowel obstructions and this is her first 1 in a few years.  She initially made n.p.o. and then advance diet to clear liquids -> FULL Liquids -> SOFT Diet. -If she tolerates Soft diet and has a BM can be discharged per Surgery. Ate minimally and having Nausea so will watch overnight to ensure  proper tolerance. Additionally only had had a small bowel movement.  -IVF had stopped but will resume at 75 mL/hr x 1 Day.   -Continue symptomatic care and continue with pain control with IV hydromorphone  0.5 mg every 3 as needed for moderate pain and severe pain and then also antiemetics with ondansetron  4 mg p.o./IV every 6 as needed nausea.   -General Surgery consulted and following; Repeat KUB done and showed No gaseous small bowel distension. Small volume of formed stool in the colon.  Constipation: The patient uses a probiotic and stool softener every day, but she still has trouble with frequent constipation.She says she can use MiraLAX  and sometimes she uses suppositories both of which help. C/w Bisacodyl  10 mg RC Daily PRN moderate Consitpation and will add Miralax  17 grams PRN Daily; Had a Small Bowel Movement   Hypothyroidism: Check TSH in the AM; C/w Levothyroxine  50 mcg po Daily   Headache: C/w Acetaminophen  650 mg po/RC q6hprn Mild Pain and add Ibuprofen  600 mg po q6hprn Headache and Moderate Pain  Hypophosphatemia: Phos Level is now 1.9. Replete w/ IV K Phos  20 mmol. CTM & Replete as Necessary. Repeat Phos Level in the AM  Depression and Anxiety: C/w Buproprion 300 mg po Every AM, Sertraline  100 mg po Daily, and Propanolol 10 mg Dailyprn Anxiety   Metabolic Acidosis: Mild and Improved. CO2 is 25, AG is 8, and Chloride Level is 106. CTM & Trend and  repeat CMP in the AM  ADHD: Hold Stimulants for now  Normocytic Anemia: Hgb/Hct Trend:  Recent Labs  Lab 01/21/24 1515 01/22/24 0434 01/23/24 0452  HGB 14.0 11.1* 10.8*  HCT 42.5 34.1* 33.1*  MCV 94.2 96.1 96.5  -Check Anemia Panel in the AM. CTM for S/Sx of Bleeding; No overt bleeding noted. Repeat CBC in the AM   Hx of Cervical Cancer: S/p Radiation and Chemotherapy. C/w Hyoscyamine  0.125 mg SL q6hprn Cramping/ Bladder Spasms

## 2024-01-22 NOTE — Progress Notes (Signed)
 " PROGRESS NOTE    See Beharry  FMW:[id number] DOB: February 06, 1975 DOA: 01/21/2024 PCP: Larnell Hamilton, MD   Brief Narrative:  Sue Benson is a 48 y.o. female with medical history significant for hypothyroidism, chronic pain, ADHD, depression, and cervical cancer treated by radiation and chemo in 2010.  This has been complicated by ongoing radiation cystitis and enteritis and recurrent SBO's. Her last SBO was 2 years ago.  They usually resolve with conservative therapy.  She was never required surgery for the SBO's.  She did require an NG tube only once. Yesterday about an hour after eating she developed some painful gas and bloating.  This happens periodically.  By the morning the pain was getting worse.  She also noticed that if she just had a sip of water  she had waves of pain across her abdomen.  By the afternoon she still was not able to drink anything and the pain was continuing to get worse so she knew she had another obstruction and presented to the emergency department.  She denies any fevers or chest pain or shortness of breath.  This feels like all the other times when she had small bowel obstruction.  In the emergency department the patient did have a CT scan of her abdomen and pelvis which revealed a small bowel obstruction.  Her white blood cell count is normal at 9.4.  She was afebrile. She reported that she felt very dehydrated but her electrolytes are still within normal limits.  She was admitted for small bowel obstruction and Surgery consulted and she is slowly improving and diet is advancing.    Assessment and Plan:  SBO in the setting of Radiation: Has had multiple episodes of small bowel obstructions and this is her first 1 in a few years.  She initially made n.p.o. and then advance diet to clear liquids -> FULL Liquids.  Continue IV fluid with LR at 125 mL/h for 1 day.  Continue symptomatic care and continue with pain control with IV hydromorphone  0.5 mg every 3 as needed for  moderate pain and severe pain and then also antiemetics with ondansetron  4 mg p.o./IV every 6 as needed nausea.  General surgery was consulted and feels that she has a low-grade partial small bowel obstruction that is improving given that her pain is resolved.  They feel that if she develops nausea vomiting that she will require an NG tube and small bowel protocol that can be initiated.  KUB done and showed No gaseous small bowel distension. Small volume of formed stool in the colon.  Constipation: The patient uses a probiotic and stool softener every day, but she still has trouble with frequent constipation.She says she can use MiraLAX  and sometimes she uses suppositories both of which help. C/w Bisacodyl  10 mg RC Daily PRN moderate Consitpation and will add Miralax  17 grams PRN Daily   Hypothyroidism: Check TSH in the AM; C/w Levothyroxine  50 mcg po Daily   Headache: C/w Acetaminophen  650 mg po/RC q6hprn Mild Pain and add Ibuprofen  600 mg po q6hprn Headache and Moderate Pain  Depression and Anxiety: C/w Buproprion 300 mg po Every AM, Sertraline  100 mg po Daily, and Propanolol 10 mg Dailyprn Anxiety   Metabolic Acidosis: Mild. CO2 is 20, AG is 13, and Chloride Level is 105. CTM & Trend and repeat CMP in the AM  ADHD: Hold Stimulants for now  Normocytic Anemia: Hgb/Hct Trend:  Recent Labs  Lab 01/21/24 1515 01/22/24 0434  HGB 14.0 11.1*  HCT 42.5 34.1*  MCV 94.2 96.1  -Check Anemia Panel in the AM. CTM for S/Sx of Bleeding; No overt bleeding noted. Repeat CBC in the AM   Hx of Cervical Cancer: S/p Radiation and Chemotherapy. C/w Hyoscyamine  0.125 mg SL q6hprn Cramping/ Bladder Spasms   DVT prophylaxis: enoxaparin  (LOVENOX ) injection 40 mg Start: 01/22/24 1000    Code Status: Full Code Family Communication: No family present @ bedside   Disposition Plan:  Level of care: Med-Surg Status is: Inpatient Remains inpatient appropriate because: Needs further clinical improvement, tolerance  of a diet and clearance by General Surgery   Consultants:  General Surgery  Procedures:  As delineated as above   Antimicrobials:  Anti-infectives (From admission, onward)    None       Subjective: Seen and examined at bedside and was doing okay and having some mild abdominal discomfort.  Had a headache earlier.  No chest pain or shortness of breath.  States that she has had multiple small bowel obstructions and the last one was a few years ago.  States that she did have a little bit of nausea but no vomiting.  No other concerns or complaints at this time.  Objective: Vitals:   01/21/24 1803 01/21/24 2158 01/21/24 2253 01/22/24 1222  BP: 109/71  112/62 98/65  Pulse: 78  69 66  Resp: 16  17   Temp: 98.3 F (36.8 C) 98.5 F (36.9 C) 97.8 F (36.6 C) 97.8 F (36.6 C)  TempSrc: Oral     SpO2: 100%  99% 100%  Weight:      Height:        Intake/Output Summary (Last 24 hours) at 01/22/2024 1554 Last data filed at 01/22/2024 1500 Gross per 24 hour  Intake 1644.65 ml  Output --  Net 1644.65 ml   Filed Weights   01/21/24 1459  Weight: 54.4 kg   Examination: Physical Exam:  Constitutional: Thin Caucasian female in no acute distress appears calm Respiratory: Mildly diminished to auscultation bilaterally, no wheezing, rales, rhonchi or crackles. Normal respiratory effort and patient is not tachypenic. No accessory muscle use.  Unlabored breathing Cardiovascular: RRR, no murmurs / rubs / gallops. S1 and S2 auscultated. No extremity edema.  Abdomen: Soft, a little-tender, non-distended. Bowel sounds positive.  GU: Deferred. Musculoskeletal: No clubbing / cyanosis of digits/nails. No joint deformity upper and lower extremities.  Skin: No rashes, lesions, ulcers. No induration; Warm and dry.  Neurologic: CN 2-12 grossly intact with no focal deficits.  Romberg sign and cerebellar reflexes not assessed.  Psychiatric: Normal judgment and insight. Alert and oriented x 3. Normal  mood and appropriate affect.   Data Reviewed: I have personally reviewed following labs and imaging studies  CBC: Recent Labs  Lab 01/21/24 1515 01/22/24 0434  WBC 9.4 9.5  HGB 14.0 11.1*  HCT 42.5 34.1*  MCV 94.2 96.1  PLT 394 280   Basic Metabolic Panel: Recent Labs  Lab 01/21/24 1515 01/22/24 0434  NA 138 139  K 4.4 4.0  CL 100 105  CO2 25 20*  GLUCOSE 114* 79  BUN 19 12  CREATININE 0.72 0.68  CALCIUM 9.8 8.5*  MG  --  2.2  PHOS  --  2.5   GFR: Estimated Creatinine Clearance: 69.6 mL/min (by C-G formula based on SCr of 0.68 mg/dL). Liver Function Tests: Recent Labs  Lab 01/21/24 1515 01/22/24 0434  AST 23 22  ALT 19 17  ALKPHOS 67 47  BILITOT 0.4 0.3  PROT 7.5 5.3*  ALBUMIN 4.8 3.5  Recent Labs  Lab 01/21/24 1515  LIPASE 18   No results for input(s): AMMONIA in the last 168 hours. Coagulation Profile: No results for input(s): INR, PROTIME in the last 168 hours. Cardiac Enzymes: No results for input(s): CKTOTAL, CKMB, CKMBINDEX, TROPONINI in the last 168 hours. BNP (last 3 results) No results for input(s): PROBNP in the last 8760 hours. HbA1C: No results for input(s): HGBA1C in the last 72 hours. CBG: No results for input(s): GLUCAP in the last 168 hours. Lipid Profile: No results for input(s): CHOL, HDL, LDLCALC, TRIG, CHOLHDL, LDLDIRECT in the last 72 hours. Thyroid  Function Tests: No results for input(s): TSH, T4TOTAL, FREET4, T3FREE, THYROIDAB in the last 72 hours. Anemia Panel: No results for input(s): VITAMINB12, FOLATE, FERRITIN, TIBC, IRON, RETICCTPCT in the last 72 hours. Sepsis Labs: No results for input(s): PROCALCITON, LATICACIDVEN in the last 168 hours.  No results found for this or any previous visit (from the past 240 hours).   Radiology Studies: DG Abd 1 View Result Date: 01/22/2024 CLINICAL DATA:  Small bowel obstruction. EXAM: ABDOMEN - 1 VIEW COMPARISON:  CT  yesterday FINDINGS: There is no gaseous small bowel distension. Small volume of formed stool in the colon. Right upper quadrant surgical clips. Small clips in the mid pelvis. No evidence of free air. IMPRESSION: No gaseous small bowel distension. Small volume of formed stool in the colon. Electronically Signed   By: Andrea Gasman M.D.   On: 01/22/2024 13:30   CT ABDOMEN PELVIS W CONTRAST Result Date: 01/21/2024 EXAM: CT ABDOMEN AND PELVIS WITH CONTRAST 01/21/2024 05:23:57 PM TECHNIQUE: CT of the abdomen and pelvis was performed with the administration of intravenous contrast. Multiplanar reformatted images are provided for review. Automated exposure control, iterative reconstruction, and/or weight-based adjustment of the mA/kV was utilized to reduce the radiation dose to as low as reasonably achievable. COMPARISON: CT abdomen and pelvis 10/25/2021. CLINICAL HISTORY: Bowel obstruction suspected. FINDINGS: LOWER CHEST: No acute abnormality. LIVER: The liver is unremarkable. GALLBLADDER AND BILE DUCTS: The gallbladder is surgically absent. No biliary ductal dilatation. SPLEEN: No acute abnormality. PANCREAS: No acute abnormality. ADRENAL GLANDS: No acute abnormality. KIDNEYS, URETERS AND BLADDER: No stones in the kidneys or ureters. No hydronephrosis. No perinephric or periureteral stranding. Urinary bladder is unremarkable. GI AND BOWEL: Moderate air fluid level in the stomach. There are numerous dilated mid and distal small bowel loops with associated mesenteric edema. A peritransition point is seen in the lower central abdomen on image 2/47. Jejunum and colon are nondilated. There is sigmoid colon diverticulosis. The appendix is not visualized. No pneumatosis identified. PERITONEUM AND RETROPERITONEUM: Trace free fluid in the pelvis. No free air. VASCULATURE: Aorta is normal in caliber. LYMPH NODES: No lymphadenopathy. REPRODUCTIVE ORGANS: Surgical clips in the region of the cervix. BONES AND SOFT TISSUES: No  acute osseous abnormality. No focal soft tissue abnormality. IMPRESSION: 1. Small bowel obstruction with transition point in the lower central abdomen. 2. Moderate gastric air-fluid level. 3. Sigmoid colon diverticulosis without diverticulitis. 4. Trace free fluid in the pelvis. Electronically signed by: Greig Pique MD 01/21/2024 06:58 PM EST RP Workstation: [id number]   Scheduled Meds:  [START ON 01/23/2024] buPROPion   300 mg Oral q morning   enoxaparin  (LOVENOX ) injection  40 mg Subcutaneous Q24H   [START ON 01/23/2024] levothyroxine   50 mcg Oral QAC breakfast   [START ON 01/23/2024] sertraline   100 mg Oral q morning   sodium chloride  flush  3 mL Intravenous Q12H   Continuous Infusions:  lactated ringers  125 mL/hr  at 01/22/24 1010    LOS: 1 day   Alejandro Marker, DO [facility] Available via Epic secure chat 7am-7pm After these hours, please refer to coverage provider listed on [email or web address] 01/22/2024, 3:54 PM  "

## 2024-01-22 NOTE — Progress Notes (Signed)
"   ° °  Subjective/Chief Complaint: Patient with no abdominal pain.  No flatus or bowel movement.   Objective: Vital signs in last 24 hours: Temp:  [97.8 F (36.6 C)-98.5 F (36.9 C)] 97.8 F (36.6 C) (12/26 2253) Pulse Rate:  [69-78] 69 (12/26 2253) Resp:  [16-17] 17 (12/26 2253) BP: (109-112)/(62-71) 112/62 (12/26 2253) SpO2:  [98 %-100 %] 99 % (12/26 2253) Weight:  [54.4 kg] 54.4 kg (12/26 1459)    Intake/Output from previous day: 12/26 0701 - 12/27 0700 In: 313.3 [I.V.:313.3] Out: -  Intake/Output this shift: No intake/output data recorded.  Abdomen: Soft nontender without rebound, guarding or significant distention.  Lab Results:  Recent Labs    01/21/24 1515 01/22/24 0434  WBC 9.4 9.5  HGB 14.0 11.1*  HCT 42.5 34.1*  PLT 394 280   BMET Recent Labs    01/21/24 1515  NA 138  K 4.4  CL 100  CO2 25  GLUCOSE 114*  BUN 19  CREATININE 0.72  CALCIUM 9.8   PT/INR No results for input(s): LABPROT, INR in the last 72 hours. ABG No results for input(s): PHART, HCO3 in the last 72 hours.  Invalid input(s): PCO2, PO2  Studies/Results: CT ABDOMEN PELVIS W CONTRAST Result Date: 01/21/2024 EXAM: CT ABDOMEN AND PELVIS WITH CONTRAST 01/21/2024 05:23:57 PM TECHNIQUE: CT of the abdomen and pelvis was performed with the administration of intravenous contrast. Multiplanar reformatted images are provided for review. Automated exposure control, iterative reconstruction, and/or weight-based adjustment of the mA/kV was utilized to reduce the radiation dose to as low as reasonably achievable. COMPARISON: CT abdomen and pelvis 10/25/2021. CLINICAL HISTORY: Bowel obstruction suspected. FINDINGS: LOWER CHEST: No acute abnormality. LIVER: The liver is unremarkable. GALLBLADDER AND BILE DUCTS: The gallbladder is surgically absent. No biliary ductal dilatation. SPLEEN: No acute abnormality. PANCREAS: No acute abnormality. ADRENAL GLANDS: No acute abnormality. KIDNEYS, URETERS  AND BLADDER: No stones in the kidneys or ureters. No hydronephrosis. No perinephric or periureteral stranding. Urinary bladder is unremarkable. GI AND BOWEL: Moderate air fluid level in the stomach. There are numerous dilated mid and distal small bowel loops with associated mesenteric edema. A peritransition point is seen in the lower central abdomen on image 2/47. Jejunum and colon are nondilated. There is sigmoid colon diverticulosis. The appendix is not visualized. No pneumatosis identified. PERITONEUM AND RETROPERITONEUM: Trace free fluid in the pelvis. No free air. VASCULATURE: Aorta is normal in caliber. LYMPH NODES: No lymphadenopathy. REPRODUCTIVE ORGANS: Surgical clips in the region of the cervix. BONES AND SOFT TISSUES: No acute osseous abnormality. No focal soft tissue abnormality. IMPRESSION: 1. Small bowel obstruction with transition point in the lower central abdomen. 2. Moderate gastric air-fluid level. 3. Sigmoid colon diverticulosis without diverticulitis. 4. Trace free fluid in the pelvis. Electronically signed by: Greig Pique MD 01/21/2024 06:58 PM EST RP Workstation: [id number]    Anti-infectives: Anti-infectives (From admission, onward)    None       Assessment/Plan: Low-grade partial small bowel obstruction-try clear liquids since pain is resolved  If she develops nausea or vomiting, she will require an NG tube and the small bowel protocol can be initiated.  KUB this morning     LOS: 1 day    Sue DELENA Shipper MD 01/22/2024 Low complexity "

## 2024-01-22 NOTE — Plan of Care (Signed)

## 2024-01-23 ENCOUNTER — Inpatient Hospital Stay (HOSPITAL_COMMUNITY)

## 2024-01-23 DIAGNOSIS — E039 Hypothyroidism, unspecified: Secondary | ICD-10-CM | POA: Diagnosis not present

## 2024-01-23 DIAGNOSIS — K56609 Unspecified intestinal obstruction, unspecified as to partial versus complete obstruction: Secondary | ICD-10-CM | POA: Diagnosis not present

## 2024-01-23 LAB — COMPREHENSIVE METABOLIC PANEL WITH GFR
ALT: 13 U/L (ref 0–44)
AST: 16 U/L (ref 15–41)
Albumin: 3.5 g/dL (ref 3.5–5.0)
Alkaline Phosphatase: 43 U/L (ref 38–126)
Anion gap: 8 (ref 5–15)
BUN: <5 mg/dL — ABNORMAL LOW (ref 6–20)
CO2: 25 mmol/L (ref 22–32)
Calcium: 8.5 mg/dL — ABNORMAL LOW (ref 8.9–10.3)
Chloride: 106 mmol/L (ref 98–111)
Creatinine, Ser: 0.57 mg/dL (ref 0.44–1.00)
GFR, Estimated: >60 mL/min
Glucose, Bld: 78 mg/dL (ref 70–99)
Potassium: 3.7 mmol/L (ref 3.5–5.1)
Sodium: 139 mmol/L (ref 135–145)
Total Bilirubin: 0.2 mg/dL (ref 0.0–1.2)
Total Protein: 5.2 g/dL — ABNORMAL LOW (ref 6.5–8.1)

## 2024-01-23 LAB — CBC WITH DIFFERENTIAL/PLATELET
Abs Immature Granulocytes: 0.02 K/uL (ref 0.00–0.07)
Basophils Absolute: 0 K/uL (ref 0.0–0.1)
Basophils Relative: 0 %
Eosinophils Absolute: 0.1 K/uL (ref 0.0–0.5)
Eosinophils Relative: 1 %
HCT: 33.1 % — ABNORMAL LOW (ref 36.0–46.0)
Hemoglobin: 10.8 g/dL — ABNORMAL LOW (ref 12.0–15.0)
Immature Granulocytes: 0 %
Lymphocytes Relative: 17 %
Lymphs Abs: 1.1 K/uL (ref 0.7–4.0)
MCH: 31.5 pg (ref 26.0–34.0)
MCHC: 32.6 g/dL (ref 30.0–36.0)
MCV: 96.5 fL (ref 80.0–100.0)
Monocytes Absolute: 0.6 K/uL (ref 0.1–1.0)
Monocytes Relative: 9 %
Neutro Abs: 4.8 K/uL (ref 1.7–7.7)
Neutrophils Relative %: 73 %
Platelets: 260 K/uL (ref 150–400)
RBC: 3.43 MIL/uL — ABNORMAL LOW (ref 3.87–5.11)
RDW: 13.4 % (ref 11.5–15.5)
WBC: 6.7 K/uL (ref 4.0–10.5)
nRBC: 0 % (ref 0.0–0.2)

## 2024-01-23 LAB — MAGNESIUM: Magnesium: 2 mg/dL (ref 1.7–2.4)

## 2024-01-23 LAB — PHOSPHORUS: Phosphorus: 1.9 mg/dL — ABNORMAL LOW (ref 2.5–4.6)

## 2024-01-23 MED ORDER — ONDANSETRON HCL 4 MG PO TABS: 4.0000 mg | ORAL_TABLET | Freq: Four times a day (QID) | ORAL | 0 refills | Status: DC | PRN

## 2024-01-23 MED ORDER — ACETAMINOPHEN 325 MG PO TABS: 650.0000 mg | ORAL_TABLET | Freq: Four times a day (QID) | ORAL | 0 refills | Status: DC | PRN

## 2024-01-23 MED ORDER — SODIUM CHLORIDE 0.9 % IV SOLN: INTRAVENOUS | Status: DC

## 2024-01-23 MED ORDER — POTASSIUM PHOSPHATES 15 MMOLE/5ML IV SOLN: 30.0000 mmol | Freq: Once | INTRAVENOUS | Status: DC

## 2024-01-23 MED ORDER — POTASSIUM PHOSPHATES 15 MMOLE/5ML IV SOLN
20.0000 mmol | Freq: Once | INTRAVENOUS | Status: AC
  Administered 2024-01-23: 20 mmol via INTRAVENOUS
  Filled 2024-01-23: qty 6.67

## 2024-01-23 NOTE — Plan of Care (Signed)

## 2024-01-23 NOTE — Progress Notes (Signed)
 " PROGRESS NOTE    Sue Benson  FMW:[id number] DOB: 09-10-1975 DOA: 01/21/2024 PCP: Larnell Hamilton, MD   Brief Narrative:  Sue Benson is a 48 y.o. female with medical history significant for hypothyroidism, chronic pain, ADHD, depression, and cervical cancer treated by radiation and chemo in 2010.  This has been complicated by ongoing radiation cystitis and enteritis and recurrent SBO's. Her last SBO was 2 years ago.  They usually resolve with conservative therapy.  She was never required surgery for the SBO's.  She did require an NG tube only once. Yesterday about an hour after eating she developed some painful gas and bloating.  This happens periodically.  By the morning the pain was getting worse.  She also noticed that if she just had a sip of water  she had waves of pain across her abdomen.  By the afternoon she still was not able to drink anything and the pain was continuing to get worse so she knew she had another obstruction and presented to the emergency department.  She denies any fevers or chest pain or shortness of breath.  This feels like all the other times when she had small bowel obstruction.  In the emergency department the patient did have a CT scan of her abdomen and pelvis which revealed a small bowel obstruction.  Her white blood cell count is normal at 9.4.  She was afebrile. She reported that she felt very dehydrated but her electrolytes are still within normal limits.  She was admitted for small bowel obstruction and Surgery consulted and she is slowly improving and diet is advancing.    She is now on a Soft Diet but having some Nausea so will continue to Monitor overnight. Additionally had only a small bowel movement   Assessment and Plan:  SBO in the setting of Radiation: Has had multiple episodes of small bowel obstructions and this is her first 1 in a few years.  She initially made n.p.o. and then advance diet to clear liquids -> FULL Liquids -> SOFT Diet. -If she  tolerates Soft diet and has a BM can be discharged per Surgery. Ate minimally and having Nausea so will watch overnight to ensure proper tolerance. Additionally only had had a small bowel movement.  -IVF had stopped but will resume at 75 mL/hr x 1 Day.   -Continue symptomatic care and continue with pain control with IV hydromorphone  0.5 mg every 3 as needed for moderate pain and severe pain and then also antiemetics with ondansetron  4 mg p.o./IV every 6 as needed nausea.   -General Surgery consulted and following; Repeat KUB done and showed No gaseous small bowel distension. Small volume of formed stool in the colon.  Constipation: The patient uses a probiotic and stool softener every day, but she still has trouble with frequent constipation.She says she can use MiraLAX  and sometimes she uses suppositories both of which help. C/w Bisacodyl  10 mg RC Daily PRN moderate Consitpation and will add Miralax  17 grams PRN Daily; Had a Small Bowel Movement   Hypothyroidism: Check TSH in the AM; C/w Levothyroxine  50 mcg po Daily   Headache: C/w Acetaminophen  650 mg po/RC q6hprn Mild Pain and add Ibuprofen  600 mg po q6hprn Headache and Moderate Pain  Hypophosphatemia: Phos Level is now 1.9. Replete w/ IV K Phos  20 mmol. CTM & Replete as Necessary. Repeat Phos Level in the AM  Depression and Anxiety: C/w Buproprion 300 mg po Every AM, Sertraline  100 mg po Daily, and Propanolol 10 mg Dailyprn  Anxiety   Metabolic Acidosis: Mild and Improved. CO2 is 25, AG is 8, and Chloride Level is 106. CTM & Trend and repeat CMP in the AM  ADHD: Hold Stimulants for now  Normocytic Anemia: Hgb/Hct Trend:  Recent Labs  Lab 01/21/24 1515 01/22/24 0434 01/23/24 0452  HGB 14.0 11.1* 10.8*  HCT 42.5 34.1* 33.1*  MCV 94.2 96.1 96.5  -Check Anemia Panel in the AM. CTM for S/Sx of Bleeding; No overt bleeding noted. Repeat CBC in the AM   Hx of Cervical Cancer: S/p Radiation and Chemotherapy. C/w Hyoscyamine  0.125 mg SL  q6hprn Cramping/ Bladder Spasms   DVT prophylaxis: enoxaparin  (LOVENOX ) injection 40 mg Start: 01/22/24 1000    Code Status: Full Code Family Communication: No family present @ bedside   Disposition Plan:  Level of care: Med-Surg Status is: Inpatient Remains inpatient appropriate because: Needs to tolerate Soft Diet without issues   Consultants:  General Surgery  Procedures:  As delineated as above   Antimicrobials:  Anti-infectives (From admission, onward)    None       Subjective: Seen and examined at bedside and was still having some mild abdominal cramping and nausea.  No chest pain.  Diet being advanced to soft and ate only a little bit of the eggs.  Denies any chest pain or lightheadedness or dizziness.  No other concerns or complaints at this time.  Objective: Vitals:   01/22/24 2031 01/22/24 2033 01/23/24 0415 01/23/24 1346  BP: 100/67 (!) 101/59 95/60 111/64  Pulse: 65 65 70 62  Resp:   16 18  Temp:   98.1 F (36.7 C) 98.1 F (36.7 C)  TempSrc:   Oral Oral  SpO2: 100% 98% 97% 100%  Weight:      Height:        Intake/Output Summary (Last 24 hours) at 01/23/2024 1619 Last data filed at 01/23/2024 1043 Gross per 24 hour  Intake 1155.72 ml  Output --  Net 1155.72 ml   Filed Weights   01/21/24 1459  Weight: 54.4 kg   Examination: Physical Exam:  Constitutional: Thin chronically ill-appearing Caucasian female in no acute distress Respiratory: Diminished to auscultation bilaterally, no wheezing, rales, rhonchi or crackles. Normal respiratory effort and patient is not tachypenic. No accessory muscle use.  Unlabored breathing Cardiovascular: RRR, no murmurs / rubs / gallops. S1 and S2 auscultated. No extremity edema.  Abdomen: Soft, a little-tender, non-distended. Bowel sounds positive.  GU: Deferred. Musculoskeletal: No clubbing / cyanosis of digits/nails. No joint deformity upper and lower extremities.  Skin: No rashes, lesions, ulcers on a limited  skin evaluation. No induration; Warm and dry.  Neurologic: CN 2-12 grossly intact with no focal deficits. Romberg sign and cerebellar reflexes not assessed.  Psychiatric: Normal judgment and insight. Alert and oriented x 3. Normal mood and appropriate affect.   Data Reviewed: I have personally reviewed following labs and imaging studies  CBC: Recent Labs  Lab 01/21/24 1515 01/22/24 0434 01/23/24 0452  WBC 9.4 9.5 6.7  NEUTROABS  --   --  4.8  HGB 14.0 11.1* 10.8*  HCT 42.5 34.1* 33.1*  MCV 94.2 96.1 96.5  PLT 394 280 260   Basic Metabolic Panel: Recent Labs  Lab 01/21/24 1515 01/22/24 0434 01/23/24 0452  NA 138 139 139  K 4.4 4.0 3.7  CL 100 105 106  CO2 25 20* 25  GLUCOSE 114* 79 78  BUN 19 12 <5*  CREATININE 0.72 0.68 0.57  CALCIUM 9.8 8.5* 8.5*  MG  --  2.2 2.0  PHOS  --  2.5 1.9*   GFR: Estimated Creatinine Clearance: 69.6 mL/min (by C-G formula based on SCr of 0.57 mg/dL). Liver Function Tests: Recent Labs  Lab 01/21/24 1515 01/22/24 0434 01/23/24 0452  AST 23 22 16   ALT 19 17 13   ALKPHOS 67 47 43  BILITOT 0.4 0.3 0.2  PROT 7.5 5.3* 5.2*  ALBUMIN 4.8 3.5 3.5   Recent Labs  Lab 01/21/24 1515  LIPASE 18   No results for input(s): AMMONIA in the last 168 hours. Coagulation Profile: No results for input(s): INR, PROTIME in the last 168 hours. Cardiac Enzymes: No results for input(s): CKTOTAL, CKMB, CKMBINDEX, TROPONINI in the last 168 hours. BNP (last 3 results) No results for input(s): PROBNP in the last 8760 hours. HbA1C: No results for input(s): HGBA1C in the last 72 hours. CBG: No results for input(s): GLUCAP in the last 168 hours. Lipid Profile: No results for input(s): CHOL, HDL, LDLCALC, TRIG, CHOLHDL, LDLDIRECT in the last 72 hours. Thyroid  Function Tests: No results for input(s): TSH, T4TOTAL, FREET4, T3FREE, THYROIDAB in the last 72 hours. Anemia Panel: No results for input(s): VITAMINB12,  FOLATE, FERRITIN, TIBC, IRON, RETICCTPCT in the last 72 hours. Sepsis Labs: No results for input(s): PROCALCITON, LATICACIDVEN in the last 168 hours.  No results found for this or any previous visit (from the past 240 hours).   Radiology Studies: DG Abd 1 View Result Date: 01/23/2024 CLINICAL DATA:  Small bowel obstruction. EXAM: ABDOMEN - 1 VIEW COMPARISON:  Radiograph yesterday FINDINGS: No gaseous small bowel distension. Small volume of formed stool in the colon. No evidence of free air on the supine views. Right upper quadrant surgical clips and small clips in the pelvis are unchanged. IMPRESSION: No gaseous small bowel distension. Small volume of formed stool in the colon. Electronically Signed   By: Andrea Gasman M.D.   On: 01/23/2024 10:49   DG Abd 1 View Result Date: 01/22/2024 CLINICAL DATA:  Small bowel obstruction. EXAM: ABDOMEN - 1 VIEW COMPARISON:  CT yesterday FINDINGS: There is no gaseous small bowel distension. Small volume of formed stool in the colon. Right upper quadrant surgical clips. Small clips in the mid pelvis. No evidence of free air. IMPRESSION: No gaseous small bowel distension. Small volume of formed stool in the colon. Electronically Signed   By: Andrea Gasman M.D.   On: 01/22/2024 13:30   CT ABDOMEN PELVIS W CONTRAST Result Date: 01/21/2024 EXAM: CT ABDOMEN AND PELVIS WITH CONTRAST 01/21/2024 05:23:57 PM TECHNIQUE: CT of the abdomen and pelvis was performed with the administration of intravenous contrast. Multiplanar reformatted images are provided for review. Automated exposure control, iterative reconstruction, and/or weight-based adjustment of the mA/kV was utilized to reduce the radiation dose to as low as reasonably achievable. COMPARISON: CT abdomen and pelvis 10/25/2021. CLINICAL HISTORY: Bowel obstruction suspected. FINDINGS: LOWER CHEST: No acute abnormality. LIVER: The liver is unremarkable. GALLBLADDER AND BILE DUCTS: The gallbladder is  surgically absent. No biliary ductal dilatation. SPLEEN: No acute abnormality. PANCREAS: No acute abnormality. ADRENAL GLANDS: No acute abnormality. KIDNEYS, URETERS AND BLADDER: No stones in the kidneys or ureters. No hydronephrosis. No perinephric or periureteral stranding. Urinary bladder is unremarkable. GI AND BOWEL: Moderate air fluid level in the stomach. There are numerous dilated mid and distal small bowel loops with associated mesenteric edema. A peritransition point is seen in the lower central abdomen on image 2/47. Jejunum and colon are nondilated. There is sigmoid colon diverticulosis. The appendix is not visualized. No  pneumatosis identified. PERITONEUM AND RETROPERITONEUM: Trace free fluid in the pelvis. No free air. VASCULATURE: Aorta is normal in caliber. LYMPH NODES: No lymphadenopathy. REPRODUCTIVE ORGANS: Surgical clips in the region of the cervix. BONES AND SOFT TISSUES: No acute osseous abnormality. No focal soft tissue abnormality. IMPRESSION: 1. Small bowel obstruction with transition point in the lower central abdomen. 2. Moderate gastric air-fluid level. 3. Sigmoid colon diverticulosis without diverticulitis. 4. Trace free fluid in the pelvis. Electronically signed by: Greig Pique MD 01/21/2024 06:58 PM EST RP Workstation: [id number]   Scheduled Meds:  buPROPion   300 mg Oral q morning   enoxaparin  (LOVENOX ) injection  40 mg Subcutaneous Q24H   levothyroxine   50 mcg Oral QAC breakfast   sertraline   100 mg Oral q morning   sodium chloride  flush  3 mL Intravenous Q12H   Continuous Infusions:  sodium chloride      potassium PHOSPHATE  IVPB (in mmol) 20 mmol (01/23/24 1040)    LOS: 2 days   Alejandro Marker, DO [facility] Available via Epic secure chat 7am-7pm After these hours, please refer to coverage provider listed on [email or web address] 01/23/2024, 4:19 PM  "

## 2024-01-23 NOTE — Progress Notes (Signed)
 "    Subjective/Chief Complaint: Patient with no nausea or vomiting.  No bowel movement or flatus.  Tolerating full liquid diet without any increase in abdominal pain.   Objective: Vital signs in last 24 hours: Temp:  [97.8 F (36.6 C)-98.1 F (36.7 C)] 98.1 F (36.7 C) (12/28 0415) Pulse Rate:  [65-70] 70 (12/28 0415) Resp:  [16] 16 (12/28 0415) BP: (95-101)/(59-67) 95/60 (12/28 0415) SpO2:  [97 %-100 %] 97 % (12/28 0415)    Intake/Output from previous day: 12/27 0701 - 12/28 0700 In: 2484.1 [P.O.:720; I.V.:1764.1] Out: -  Intake/Output this shift: No intake/output data recorded.  Abdomen: Soft nontender without rebound or guarding  Lab Results:  Recent Labs    01/22/24 0434 01/23/24 0452  WBC 9.5 6.7  HGB 11.1* 10.8*  HCT 34.1* 33.1*  PLT 280 260   BMET Recent Labs    01/22/24 0434 01/23/24 0452  NA 139 139  K 4.0 3.7  CL 105 106  CO2 20* 25  GLUCOSE 79 78  BUN 12 <5*  CREATININE 0.68 0.57  CALCIUM 8.5* 8.5*   PT/INR No results for input(s): LABPROT, INR in the last 72 hours. ABG No results for input(s): PHART, HCO3 in the last 72 hours.  Invalid input(s): PCO2, PO2  Studies/Results: DG Abd 1 View Result Date: 01/22/2024 CLINICAL DATA:  Small bowel obstruction. EXAM: ABDOMEN - 1 VIEW COMPARISON:  CT yesterday FINDINGS: There is no gaseous small bowel distension. Small volume of formed stool in the colon. Right upper quadrant surgical clips. Small clips in the mid pelvis. No evidence of free air. IMPRESSION: No gaseous small bowel distension. Small volume of formed stool in the colon. Electronically Signed   By: Andrea Gasman M.D.   On: 01/22/2024 13:30   CT ABDOMEN PELVIS W CONTRAST Result Date: 01/21/2024 EXAM: CT ABDOMEN AND PELVIS WITH CONTRAST 01/21/2024 05:23:57 PM TECHNIQUE: CT of the abdomen and pelvis was performed with the administration of intravenous contrast. Multiplanar reformatted images are provided for review. Automated  exposure control, iterative reconstruction, and/or weight-based adjustment of the mA/kV was utilized to reduce the radiation dose to as low as reasonably achievable. COMPARISON: CT abdomen and pelvis 10/25/2021. CLINICAL HISTORY: Bowel obstruction suspected. FINDINGS: LOWER CHEST: No acute abnormality. LIVER: The liver is unremarkable. GALLBLADDER AND BILE DUCTS: The gallbladder is surgically absent. No biliary ductal dilatation. SPLEEN: No acute abnormality. PANCREAS: No acute abnormality. ADRENAL GLANDS: No acute abnormality. KIDNEYS, URETERS AND BLADDER: No stones in the kidneys or ureters. No hydronephrosis. No perinephric or periureteral stranding. Urinary bladder is unremarkable. GI AND BOWEL: Moderate air fluid level in the stomach. There are numerous dilated mid and distal small bowel loops with associated mesenteric edema. A peritransition point is seen in the lower central abdomen on image 2/47. Jejunum and colon are nondilated. There is sigmoid colon diverticulosis. The appendix is not visualized. No pneumatosis identified. PERITONEUM AND RETROPERITONEUM: Trace free fluid in the pelvis. No free air. VASCULATURE: Aorta is normal in caliber. LYMPH NODES: No lymphadenopathy. REPRODUCTIVE ORGANS: Surgical clips in the region of the cervix. BONES AND SOFT TISSUES: No acute osseous abnormality. No focal soft tissue abnormality. IMPRESSION: 1. Small bowel obstruction with transition point in the lower central abdomen. 2. Moderate gastric air-fluid level. 3. Sigmoid colon diverticulosis without diverticulitis. 4. Trace free fluid in the pelvis. 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  If no return of bowel function by tomorrow, will initiate small bowel protocol p.o.     LOS: 2 days    Debby DELENA Shipper MD 01/23/2024 Moderate complexity "

## 2024-01-23 NOTE — Plan of Care (Signed)

## 2024-01-24 ENCOUNTER — Other Ambulatory Visit (HOSPITAL_COMMUNITY): Payer: Self-pay

## 2024-01-24 ENCOUNTER — Inpatient Hospital Stay (HOSPITAL_COMMUNITY)

## 2024-01-24 DIAGNOSIS — Z79891 Long term (current) use of opiate analgesic: Secondary | ICD-10-CM | POA: Diagnosis not present

## 2024-01-24 DIAGNOSIS — E2740 Unspecified adrenocortical insufficiency: Secondary | ICD-10-CM | POA: Diagnosis not present

## 2024-01-24 DIAGNOSIS — K56609 Unspecified intestinal obstruction, unspecified as to partial versus complete obstruction: Secondary | ICD-10-CM | POA: Diagnosis not present

## 2024-01-24 DIAGNOSIS — E039 Hypothyroidism, unspecified: Secondary | ICD-10-CM | POA: Diagnosis not present

## 2024-01-24 LAB — CBC WITH DIFFERENTIAL/PLATELET
Abs Immature Granulocytes: 0.03 K/uL (ref 0.00–0.07)
Basophils Absolute: 0 K/uL (ref 0.0–0.1)
Basophils Relative: 0 %
Eosinophils Absolute: 0 K/uL (ref 0.0–0.5)
Eosinophils Relative: 0 %
HCT: 36.8 % (ref 36.0–46.0)
Hemoglobin: 12.1 g/dL (ref 12.0–15.0)
Immature Granulocytes: 0 %
Lymphocytes Relative: 10 %
Lymphs Abs: 0.9 K/uL (ref 0.7–4.0)
MCH: 31.3 pg (ref 26.0–34.0)
MCHC: 32.9 g/dL (ref 30.0–36.0)
MCV: 95.1 fL (ref 80.0–100.0)
Monocytes Absolute: 0.7 K/uL (ref 0.1–1.0)
Monocytes Relative: 8 %
Neutro Abs: 7.3 K/uL (ref 1.7–7.7)
Neutrophils Relative %: 82 %
Platelets: 306 K/uL (ref 150–400)
RBC: 3.87 MIL/uL (ref 3.87–5.11)
RDW: 13.2 % (ref 11.5–15.5)
WBC: 9 K/uL (ref 4.0–10.5)
nRBC: 0 % (ref 0.0–0.2)

## 2024-01-24 LAB — FOLATE: Folate: >20 ng/mL

## 2024-01-24 LAB — IRON AND TIBC
Iron: 42 ug/dL (ref 28–170)
Saturation Ratios: 15 % (ref 10.4–31.8)
TIBC: 290 ug/dL (ref 250–450)
UIBC: 247 ug/dL

## 2024-01-24 LAB — COMPREHENSIVE METABOLIC PANEL WITH GFR
ALT: 14 U/L (ref 0–44)
AST: 19 U/L (ref 15–41)
Albumin: 4.2 g/dL (ref 3.5–5.0)
Alkaline Phosphatase: 52 U/L (ref 38–126)
Anion gap: 13 (ref 5–15)
BUN: 5 mg/dL — ABNORMAL LOW (ref 6–20)
CO2: 24 mmol/L (ref 22–32)
Calcium: 9.5 mg/dL (ref 8.9–10.3)
Chloride: 101 mmol/L (ref 98–111)
Creatinine, Ser: 0.69 mg/dL (ref 0.44–1.00)
GFR, Estimated: >60 mL/min
Glucose, Bld: 109 mg/dL — ABNORMAL HIGH (ref 70–99)
Potassium: 3.8 mmol/L (ref 3.5–5.1)
Sodium: 138 mmol/L (ref 135–145)
Total Bilirubin: 0.3 mg/dL (ref 0.0–1.2)
Total Protein: 6.5 g/dL (ref 6.5–8.1)

## 2024-01-24 LAB — RETICULOCYTES
Immature Retic Fract: 8.6 % (ref 2.3–15.9)
RBC.: 3.92 MIL/uL (ref 3.87–5.11)
Retic Count, Absolute: 56.1 K/uL (ref 19.0–186.0)
Retic Ct Pct: 1.4 % (ref 0.4–3.1)

## 2024-01-24 LAB — VITAMIN B12: Vitamin B-12: 1308 pg/mL — ABNORMAL HIGH (ref 180–914)

## 2024-01-24 LAB — FERRITIN: Ferritin: 59 ng/mL (ref 11–307)

## 2024-01-24 LAB — MAGNESIUM: Magnesium: 2 mg/dL (ref 1.7–2.4)

## 2024-01-24 LAB — PHOSPHORUS: Phosphorus: 2.8 mg/dL (ref 2.5–4.6)

## 2024-01-24 LAB — TSH: TSH: 0.662 u[IU]/mL (ref 0.350–4.500)

## 2024-01-24 MED ORDER — DICYCLOMINE HCL 10 MG PO CAPS
10.0000 mg | ORAL_CAPSULE | Freq: Three times a day (TID) | ORAL | 0 refills | Status: AC | PRN
  Filled 2024-01-24: qty 30, 10d supply, fill #0

## 2024-01-24 MED ORDER — DICYCLOMINE HCL 10 MG PO CAPS: 10.0000 mg | ORAL_CAPSULE | Freq: Three times a day (TID) | ORAL | Status: DC | PRN

## 2024-01-24 MED ORDER — ONDANSETRON HCL 4 MG PO TABS
4.0000 mg | ORAL_TABLET | Freq: Four times a day (QID) | ORAL | 0 refills | Status: AC | PRN
  Filled 2024-01-24: qty 20, 5d supply, fill #0

## 2024-01-24 MED ORDER — ACETAMINOPHEN 325 MG PO TABS
650.0000 mg | ORAL_TABLET | Freq: Four times a day (QID) | ORAL | 0 refills | Status: AC | PRN
  Filled 2024-01-24: qty 20, 3d supply, fill #0

## 2024-01-24 NOTE — Progress Notes (Signed)
 "    Subjective/Chief Complaint: Doing well  HAd BMs yesterday   Objective: Vital signs in last 24 hours: Temp:  [98.1 F (36.7 C)-98.4 F (36.9 C)] 98.4 F (36.9 C) (12/29 0349) Pulse Rate:  [62-67] 67 (12/29 0349) Resp:  [15-20] 20 (12/29 0349) BP: (96-122)/(64-68) 122/64 (12/29 0349) SpO2:  [97 %-100 %] 97 % (12/29 0349)    Intake/Output from previous day: 12/28 0701 - 12/29 0700 In: 872.5 [P.O.:420; I.V.:3; IV Piggyback:449.5] Out: -  Intake/Output this shift: No intake/output data recorded.  PE:  Constitutional: No acute distress, conversant, appears states age. Eyes: Anicteric sclerae, moist conjunctiva, no lid lag Lungs: Clear to auscultation bilaterally, normal respiratory effort CV: regular rate and rhythm, no murmurs, no peripheral edema, pedal pulses 2+ GI: Soft, no masses or hepatosplenomegaly, non-tender to palpation Skin: No rashes, palpation reveals normal turgor Psychiatric: appropriate judgment and insight, oriented to person, place, and time   Lab Results:  Recent Labs    01/23/24 0452 01/24/24 0414  WBC 6.7 9.0  HGB 10.8* 12.1  HCT 33.1* 36.8  PLT 260 306   BMET Recent Labs    01/23/24 0452 01/24/24 0414  NA 139 138  K 3.7 3.8  CL 106 101  CO2 25 24  GLUCOSE 78 109*  BUN <5* 5*  CREATININE 0.57 0.69  CALCIUM 8.5* 9.5   PT/INR No results for input(s): LABPROT, INR in the last 72 hours. ABG No results for input(s): PHART, HCO3 in the last 72 hours.  Invalid input(s): PCO2, PO2  Studies/Results: DG Abd 1 View Result Date: 01/24/2024 EXAM: 1 VIEW XRAY OF THE ABDOMEN 01/24/2024 04:49:00 AM COMPARISON: 01/23/2024 CLINICAL HISTORY: SBO (small bowel obstruction) (HCC) FINDINGS: LINES, TUBES AND DEVICES: Surgical clips in right upper quadrant. Surgical clips in pelvis. BOWEL: Paucity of small bowel gas limits evaluation for obstruction. There are 2 adjacent loops of air-filled small bowel left lower quadrant which are  nondilated measuring up to 1.9 cm. SOFT TISSUES: No abnormal calcifications. BONES: No acute fracture. IMPRESSION: 1. Bowel gas pattern is nonobstructed, though paucity of small bowel gas limits evaluation. 2. Two adjacent air-filled small bowel loops in the left lower quadrant are nondilated, measuring up to 1.9 cm. Electronically signed by: Waddell Calk MD 01/24/2024 06:42 AM EST RP Workstation: [id number]   DG Abd 1 View Result Date: 01/23/2024 CLINICAL DATA:  Small bowel obstruction. EXAM: ABDOMEN - 1 VIEW COMPARISON:  Radiograph yesterday FINDINGS: No gaseous small bowel distension. Small volume of formed stool in the colon. No evidence of free air on the supine views. Right upper quadrant surgical clips and small clips in the pelvis are unchanged. IMPRESSION: No gaseous small bowel distension. Small volume of formed stool in the colon. Electronically Signed   By: Andrea Gasman M.D.   On: 01/23/2024 10:49   DG Abd 1 View Result Date: 01/22/2024 CLINICAL DATA:  Small bowel obstruction. EXAM: ABDOMEN - 1 VIEW COMPARISON:  CT yesterday FINDINGS: There is no gaseous small bowel distension. Small volume of formed stool in the colon. Right upper quadrant surgical clips. Small clips in the mid pelvis. No evidence of free air. IMPRESSION: No gaseous small bowel distension. Small volume of formed stool in the colon. Electronically Signed   By: Andrea Gasman M.D.   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

## 2024-01-24 NOTE — TOC Initial Note (Signed)
 Transition of Care [facility]) - Initial/Assessment Note    Patient Details  Name: Sue Benson MRN: [id number] Date of Birth: 08-07-1975  Transition of Care [facility]) CM/SW Contact:    Doneta Glenys DASEN, RN Phone Number: 01/24/2024, 2:46 PM  Clinical Narrative:                 Transition of Care [facility]) - Inpatient Brief Assessment   Patient Details  Name: Sue Benson MRN: [id number] Date of Birth: May 18, 1975  Transition of Care [facility]) CM/SW Contact:    Doneta Glenys DASEN, RN Phone Number: 01/24/2024, 2:46 PM   Clinical Narrative: No IP CM needs identified during chart review.   Transition of Care Asessment: Insurance and Status: Insurance coverage has been reviewed Patient has primary care physician: Yes Home environment has been reviewed: Single Family Home Prior level of function:: Independent Prior/Current Home Services: No current home services Social Drivers of Health Review: SDOH reviewed no interventions necessary Readmission risk has been reviewed: Yes Transition of care needs: no transition of care needs at this time         Patient Goals and CMS Choice            Expected Discharge Plan and Services         Expected Discharge Date: 01/24/24                                    Prior Living Arrangements/Services                       Activities of Daily Living   ADL Screening (condition at time of admission) Independently performs ADLs?: Yes (appropriate for developmental age) Is the patient deaf or have difficulty hearing?: Yes (mild hearing loss) Does the patient have difficulty seeing, even when wearing glasses/contacts?: No Does the patient have difficulty concentrating, remembering, or making decisions?: No  Permission Sought/Granted                  Emotional Assessment              Admission diagnosis:  Small bowel obstruction (HCC) [K56.609] SBO (small bowel obstruction) (HCC) [K56.609] Patient Active  Problem List   Diagnosis Date Noted   Normocytic anemia 10/28/2021   Anxiety 10/26/2021   Acquired hypothyroidism 10/28/2020   Dilated bile duct 10/28/2020   Thyroid  nodule 11/27/2014   Hashimoto's thyroiditis 11/27/2014   Long term prescription opiate use 11/13/2014   Adrenal insufficiency (HCC) 06/30/2014   Anemia of chronic disease 06/30/2014   Chronic pelvic pain in female 02/26/2014   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   SBO (small bowel obstruction) (HCC) 06/15/2013   Nausea 06/15/2013   Abdominal pain 06/15/2013   Leukocytosis 06/15/2013   Partial small bowel obstruction (HCC) 06/15/2013   Anxiety and depression 04/13/2013   Pain in joint, pelvic region and thigh 03/24/2013   Chronic radiation cystitis 03/24/2013   Malignant neoplasm of cervix uteri, unspecified (HCC) 11/15/2012   S/P radiation therapy 04/13/2008   PCP:  Larnell Hamilton, MD Pharmacy:   [facility] [phone] [place], [place] - [address] AT [facility] [address] & [facility] [address] [place] [address] Phone: [phone] Fax: [phone]  [place] [facility] [address] [place] [address] Phone: [phone] Fax: [phone]     Social Drivers of Health (SDOH) Social History: SDOH Screenings  Food Insecurity: No Food Insecurity (01/21/2024)  Housing: Low Risk (01/21/2024)  Transportation Needs: No Transportation Needs (01/21/2024)  Utilities: Not At Risk (01/21/2024)  Tobacco Use: High Risk (01/21/2024)   SDOH Interventions:     Readmission Risk Interventions     No data to display

## 2024-01-25 NOTE — Discharge Summary (Signed)
 " Physician Discharge Summary   Patient: Sue Benson MRN: [id number] DOB: 06-Oct-1975  Admit date:     01/21/2024  Discharge date: 01/24/2024  Discharge Physician: Alejandro Marker, DO   PCP: Larnell Hamilton, MD   Recommendations at discharge:   Follow-up with PCP within 1 to 2 weeks repeat CBC, CMP, mag, Phos within 1 week Follow-up with general surgery in outpatient setting if necessary  Discharge Diagnoses: Principal Problem:   SBO (small bowel obstruction) (HCC) Active Problems:   Adrenal insufficiency (HCC)   Acquired hypothyroidism   Long term prescription opiate use  Resolved Problems:   * No resolved hospital problems. *  Hospital Course: Charmeka Freeburg is a 48 y.o. female with medical history significant for hypothyroidism, chronic pain, ADHD, depression, and cervical cancer treated by radiation and chemo in 2010.  This has been complicated by ongoing radiation cystitis and enteritis and recurrent SBO's. Her last SBO was 2 years ago.  They usually resolve with conservative therapy.  She was never required surgery for the SBO's.  She did require an NG tube only once. Yesterday about an hour after eating she developed some painful gas and bloating.  This happens periodically.  By the morning the pain was getting worse.  She also noticed that if she just had a sip of water  she had waves of pain across her abdomen.  By the afternoon she still was not able to drink anything and the pain was continuing to get worse so she knew she had another obstruction and presented to the emergency department.  She denies any fevers or chest pain or shortness of breath.  This feels like all the other times when she had small bowel obstruction.  In the emergency department the patient did have a CT scan of her abdomen and pelvis which revealed a small bowel obstruction.  Her white blood cell count is normal at 9.4.  She was afebrile. She reported that she felt very dehydrated but her electrolytes are  still within normal limits.  She was admitted for small bowel obstruction and Surgery consulted and she is slowly improving and diet is advancing.    She is now on a Soft Diet but having some Nausea so will continue to Monitor overnight.  Started having more bowel movements and tolerating her diet without issues and improved.  She is medically stable for discharge today to follow-up with PCP and if necessary follow-up with general surgery.  Assessment and Plan:  SBO in the setting of Radiation: Has had multiple episodes of small bowel obstructions and this is her first 1 in a few years.  She initially made n.p.o. and then advance diet to clear liquids -> FULL Liquids -> SOFT Diet. -If she tolerates Soft diet and has a BM can be discharged per Surgery. Ate minimally and having Nausea so will watch overnight to ensure proper tolerance. Additionally only had had a small bowel movement.  -IVF had stopped but will resume at 75 mL/hr x 1 Day.   -Continue symptomatic care and continue with pain control with IV hydromorphone  0.5 mg every 3 as needed for moderate pain and severe pain and then also antiemetics with ondansetron  4 mg p.o./IV every 6 as needed nausea.   -General Surgery consulted and following; Repeat KUB done and showed No gaseous small bowel distension. Small volume of formed stool in the colon. - She tolerated her diet fairly well today and had no issues overnight.  She is medically stable and will follow-up with PCP  within 1 to 2 weeks - She was having some abdominal spasms so we will give her some Bentyl and discontinue her hyoscyamine   Constipation: The patient uses a probiotic and stool softener every day, but she still has trouble with frequent constipation.She says she can use MiraLAX  and sometimes she uses suppositories both of which help. C/w Bisacodyl  10 mg RC Daily PRN moderate Consitpation and will add Miralax  17 grams PRN Daily; Had a Small Bowel Movement   Hypothyroidism: TSH was  0.662; C/w Levothyroxine  50 mcg po Daily   Headache: C/w Acetaminophen  650 mg po/RC q6hprn Mild Pain and add Ibuprofen  600 mg po q6hprn Headache and Moderate Pain  Hypophosphatemia: Phos Level is now 1.9. Replete w/ IV K Phos  20 mmol. CTM & Replete as Necessary. Repeat Phos Level in the AM  Depression and Anxiety: C/w Buproprion 300 mg po Every AM, Sertraline  100 mg po Daily, and Propanolol 10 mg Dailyprn Anxiety   Metabolic Acidosis: Mild and Improved. CO2 is 25, AG is 8, and Chloride Level is 106. CTM & Trend and repeat CMP in the AM  ADHD: Hold Stimulants for now  Normocytic Anemia: Hgb/Hct Trend:  Recent Labs  Lab 01/21/24 1515 01/22/24 0434 01/23/24 0452  HGB 14.0 11.1* 10.8*  HCT 42.5 34.1* 33.1*  MCV 94.2 96.1 96.5  -Checked Anemia Panel and showed an iron level of 42, UIBC 247, TIBC 290, saturation ratios of 15%, ferritin of 59, folate level greater than 20.0 and vitamin B12 1308. CTM for S/Sx of Bleeding; No overt bleeding noted. Repeat CBC in the AM   Hx of Cervical Cancer: S/p Radiation and Chemotherapy.  Discontinue Hyoscyamine  0.125 mg SL q6hprn Cramping/ Bladder Spasms  Consultants: General Surgery Procedures performed: As delineated as above Disposition: Home Diet recommendation:  Discharge Diet Orders (From admission, onward)     Start     Ordered   01/24/24 0000  Diet general       Comments: SOFT Diet   01/24/24 1347   01/23/24 0000  Diet general       Comments: SOFT Diet   01/23/24 1411           Regular diet - SOFT  DISCHARGE MEDICATION: Allergies as of 01/24/2024       Reactions   Penicillins Anaphylaxis, Swelling, Dermatitis, Other (See Comments)   Swelling was all over, as was redness of the skin   Omeprazole Hives, Other (See Comments)   Patient states that it shut down her stomach too much- a lot of cramping   Cefaclor Rash        Medication List     STOP taking these medications    hyoscyamine  0.125 MG Tbdp disintergrating  tablet Commonly known as: ANASPAZ        TAKE these medications    acetaminophen  325 MG tablet Commonly known as: TYLENOL  Take 2 tablets (650 mg total) by mouth every 6 (six) hours as needed for mild pain (pain score 1-3) or fever (or Fever >/= 101).   Advil  200 MG tablet Generic drug: ibuprofen  Take 200-400 mg by mouth every 8 (eight) hours as needed (for headaches).   amphetamine -dextroamphetamine  10 MG tablet Commonly known as: ADDERALL Take 10 mg by mouth See admin instructions. Take 10 mg by mouth in the morning and an additional 10 mg later in the day as needed   buPROPion  300 MG 24 hr tablet Commonly known as: WELLBUTRIN  XL Take 300 mg by mouth every morning.   diazepam  10 MG tablet Commonly  known as: VALIUM  10 mg See admin instructions. Insert 10 mg vaginally at bedtime   dicyclomine 10 MG capsule Commonly known as: BENTYL Take 1 capsule (10 mg total) by mouth 3 (three) times daily as needed for spasms.   estradiol  0.01 % Crea vaginal cream Commonly known as: ESTRACE  Place 0.5 g vaginally 3 (three) times a week.   estradiol  0.05 MG/24HR patch Commonly known as: VIVELLE -DOT Place 1 patch onto the skin See admin instructions. Place 1 new patch onto the skin on Mondays and Fridays   fluticasone  50 MCG/ACT nasal spray Commonly known as: FLONASE  Place 1-2 sprays into both nostrils 2 (two) times daily as needed for allergies or rhinitis.   Gas-X Extra Strength 125 MG chewable tablet Generic drug: simethicone Chew 125 mg by mouth every 6 (six) hours as needed for flatulence.   levothyroxine  50 MCG tablet Commonly known as: SYNTHROID  Take 1 tablet (50 mcg total) by mouth every morning. What changed: when to take this   Mucinex 600 MG 12 hr tablet Generic drug: guaiFENesin Take 600 mg by mouth 2 (two) times daily as needed for to loosen phlegm or cough.   ondansetron  4 MG tablet Commonly known as: ZOFRAN  Take 1 tablet (4 mg total) by mouth every 6 (six) hours  as needed for nausea. What changed:  when to take this reasons to take this   propranolol  10 MG tablet Commonly known as: INDERAL  Take 10 mg by mouth daily as needed (for anxiety).   sertraline  100 MG tablet Commonly known as: ZOLOFT  Take 100 mg by mouth every morning.        Follow-up Information     Larnell Hamilton, MD. Call.   Specialty: Internal Medicine Why: Follow up within 1-2 weeks Contact information: [address] [place] [address] [phone]         [facility] Follow up.   Why: Call for Appointment if needed for Hospital follow up for SBO Contact information: [address] [place]   [id number]               Discharge Exam: Fredricka Weights   01/21/24 1459  Weight: 54.4 kg   Vitals:   01/24/24 0349 01/24/24 1355  BP: 122/64 (!) 90/52  Pulse: 67 (!) 58  Resp: 20 14  Temp: 98.4 F (36.9 C) 97.9 F (36.6 C)  SpO2: 97% 100%   Examination: Physical Exam:  Constitutional: WN/WD Caucasian female in no acute distress Respiratory: Diminished to auscultation bilaterally, no wheezing, rales, rhonchi or crackles. Normal respiratory effort and patient is not tachypenic. No accessory muscle use.  Unlabored breathing Cardiovascular: RRR, no murmurs / rubs / gallops. S1 and S2 auscultated. No extremity edema. Abdomen: Soft, mildly-tender, non-distended. Bowel sounds positive.  GU: Deferred. Musculoskeletal: No clubbing / cyanosis of digits/nails. No joint deformity upper and lower extremities.  Skin: No rashes, lesions, ulcers on a limited skin evaluation. No induration; Warm and dry.  Neurologic: CN 2-12 grossly intact with no focal deficits. Romberg sign and cerebellar reflexes not assessed.  Psychiatric: Normal judgment and insight. Alert and oriented x 3. Normal mood and appropriate affect.   Condition at discharge: stable  The results of significant diagnostics from this  hospitalization (including imaging, microbiology, ancillary and laboratory) are listed below for reference.   Imaging Studies: DG Abd 1 View Result Date: 01/24/2024 EXAM: 1 VIEW XRAY OF THE ABDOMEN 01/24/2024 04:49:00 AM COMPARISON: 01/23/2024 CLINICAL HISTORY: SBO (small bowel obstruction) (HCC) FINDINGS: LINES,  TUBES AND DEVICES: Surgical clips in right upper quadrant. Surgical clips in pelvis. BOWEL: Paucity of small bowel gas limits evaluation for obstruction. There are 2 adjacent loops of air-filled small bowel left lower quadrant which are nondilated measuring up to 1.9 cm. SOFT TISSUES: No abnormal calcifications. BONES: No acute fracture. IMPRESSION: 1. Bowel gas pattern is nonobstructed, though paucity of small bowel gas limits evaluation. 2. Two adjacent air-filled small bowel loops in the left lower quadrant are nondilated, measuring up to 1.9 cm. Electronically signed by: Waddell Calk MD 01/24/2024 06:42 AM EST RP Workstation: [id number]   DG Abd 1 View Result Date: 01/23/2024 CLINICAL DATA:  Small bowel obstruction. EXAM: ABDOMEN - 1 VIEW COMPARISON:  Radiograph yesterday FINDINGS: No gaseous small bowel distension. Small volume of formed stool in the colon. No evidence of free air on the supine views. Right upper quadrant surgical clips and small clips in the pelvis are unchanged. IMPRESSION: No gaseous small bowel distension. Small volume of formed stool in the colon. Electronically Signed   By: Andrea Gasman M.D.   On: 01/23/2024 10:49   DG Abd 1 View Result Date: 01/22/2024 CLINICAL DATA:  Small bowel obstruction. EXAM: ABDOMEN - 1 VIEW COMPARISON:  CT yesterday FINDINGS: There is no gaseous small bowel distension. Small volume of formed stool in the colon. Right upper quadrant surgical clips. Small clips in the mid pelvis. No evidence of free air. IMPRESSION: No gaseous small bowel distension. Small volume of formed stool in the colon. Electronically Signed   By: Andrea Gasman  M.D.   On: 01/22/2024 13:30   CT ABDOMEN PELVIS W CONTRAST Result Date: 01/21/2024 EXAM: CT ABDOMEN AND PELVIS WITH CONTRAST 01/21/2024 05:23:57 PM TECHNIQUE: CT of the abdomen and pelvis was performed with the administration of intravenous contrast. Multiplanar reformatted images are provided for review. Automated exposure control, iterative reconstruction, and/or weight-based adjustment of the mA/kV was utilized to reduce the radiation dose to as low as reasonably achievable. COMPARISON: CT abdomen and pelvis 10/25/2021. CLINICAL HISTORY: Bowel obstruction suspected. FINDINGS: LOWER CHEST: No acute abnormality. LIVER: The liver is unremarkable. GALLBLADDER AND BILE DUCTS: The gallbladder is surgically absent. No biliary ductal dilatation. SPLEEN: No acute abnormality. PANCREAS: No acute abnormality. ADRENAL GLANDS: No acute abnormality. KIDNEYS, URETERS AND BLADDER: No stones in the kidneys or ureters. No hydronephrosis. No perinephric or periureteral stranding. Urinary bladder is unremarkable. GI AND BOWEL: Moderate air fluid level in the stomach. There are numerous dilated mid and distal small bowel loops with associated mesenteric edema. A peritransition point is seen in the lower central abdomen on image 2/47. Jejunum and colon are nondilated. There is sigmoid colon diverticulosis. The appendix is not visualized. No pneumatosis identified. PERITONEUM AND RETROPERITONEUM: Trace free fluid in the pelvis. No free air. VASCULATURE: Aorta is normal in caliber. LYMPH NODES: No lymphadenopathy. REPRODUCTIVE ORGANS: Surgical clips in the region of the cervix. BONES AND SOFT TISSUES: No acute osseous abnormality. No focal soft tissue abnormality. IMPRESSION: 1. Small bowel obstruction with transition point in the lower central abdomen. 2. Moderate gastric air-fluid level. 3. Sigmoid colon diverticulosis without diverticulitis. 4. Trace free fluid in the pelvis. Electronically signed by: Greig Pique MD 01/21/2024  06:58 PM EST RP Workstation: [id number]   Microbiology: No results found for this or any previous visit.  Labs: CBC: Recent Labs  Lab 01/21/24 1515 01/22/24 0434 01/23/24 0452 01/24/24 0414  WBC 9.4 9.5 6.7 9.0  NEUTROABS  --   --  4.8 7.3  HGB 14.0 11.1*  10.8* 12.1  HCT 42.5 34.1* 33.1* 36.8  MCV 94.2 96.1 96.5 95.1  PLT 394 280 260 306   Basic Metabolic Panel: Recent Labs  Lab 01/21/24 1515 01/22/24 0434 01/23/24 0452 01/24/24 0414  NA 138 139 139 138  K 4.4 4.0 3.7 3.8  CL 100 105 106 101  CO2 25 20* 25 24  GLUCOSE 114* 79 78 109*  BUN 19 12 <5* 5*  CREATININE 0.72 0.68 0.57 0.69  CALCIUM 9.8 8.5* 8.5* 9.5  MG  --  2.2 2.0 2.0  PHOS  --  2.5 1.9* 2.8   Liver Function Tests: Recent Labs  Lab 01/21/24 1515 01/22/24 0434 01/23/24 0452 01/24/24 0414  AST 23 22 16 19   ALT 19 17 13 14   ALKPHOS 67 47 43 52  BILITOT 0.4 0.3 0.2 0.3  PROT 7.5 5.3* 5.2* 6.5  ALBUMIN 4.8 3.5 3.5 4.2   CBG: No results for input(s): GLUCAP in the last 168 hours.  Discharge time spent: greater than 30 minutes.  Signed: Alejandro Marker, DO [facility] 01/25/2024 "

## 2025-01-08 ENCOUNTER — Ambulatory Visit: Admitting: Internal Medicine
